# Patient Record
Sex: Male | Born: 1950 | Race: White | Hispanic: No | Marital: Married | Smoking: Former smoker
Health system: Southern US, Community
[De-identification: ages and names within clinical notes are randomized; demographics above are authoritative.]

## PROBLEM LIST (undated history)

## (undated) DIAGNOSIS — R011 Cardiac murmur, unspecified: Secondary | ICD-10-CM

## (undated) DIAGNOSIS — I350 Nonrheumatic aortic (valve) stenosis: Secondary | ICD-10-CM

## (undated) DIAGNOSIS — H269 Unspecified cataract: Secondary | ICD-10-CM

## (undated) DIAGNOSIS — R7309 Other abnormal glucose: Secondary | ICD-10-CM

## (undated) DIAGNOSIS — C439 Malignant melanoma of skin, unspecified: Secondary | ICD-10-CM

## (undated) DIAGNOSIS — Z8639 Personal history of other endocrine, nutritional and metabolic disease: Secondary | ICD-10-CM

## (undated) HISTORY — DX: Personal history of other endocrine, nutritional and metabolic disease: Z86.39

## (undated) HISTORY — DX: Cardiac murmur, unspecified: R01.1

## (undated) HISTORY — DX: Other abnormal glucose: R73.09

## (undated) HISTORY — PX: MELANOMA EXCISION: SHX5266

## (undated) HISTORY — PX: THYROGLOSSAL DUCT CYST: SHX297

## (undated) HISTORY — PX: EYE SURGERY: SHX253

## (undated) HISTORY — DX: Unspecified cataract: H26.9

---

## 1999-01-09 HISTORY — PX: THYROGLOSSAL DUCT CYST: SHX297

## 2001-01-08 HISTORY — PX: COLONOSCOPY: SHX174

## 2004-01-09 HISTORY — PX: MELANOMA EXCISION: SHX5266

## 2009-01-08 DIAGNOSIS — Z9889 Other specified postprocedural states: Secondary | ICD-10-CM

## 2009-01-08 HISTORY — DX: Other specified postprocedural states: Z98.890

## 2009-12-17 ENCOUNTER — Emergency Department: Payer: Self-pay | Admitting: Emergency Medicine

## 2012-02-12 DIAGNOSIS — Z566 Other physical and mental strain related to work: Secondary | ICD-10-CM | POA: Insufficient documentation

## 2013-03-30 DIAGNOSIS — R748 Abnormal levels of other serum enzymes: Secondary | ICD-10-CM | POA: Insufficient documentation

## 2016-11-06 DIAGNOSIS — Z8249 Family history of ischemic heart disease and other diseases of the circulatory system: Secondary | ICD-10-CM | POA: Insufficient documentation

## 2019-04-13 DIAGNOSIS — M6208 Separation of muscle (nontraumatic), other site: Secondary | ICD-10-CM | POA: Insufficient documentation

## 2019-04-13 DIAGNOSIS — K420 Umbilical hernia with obstruction, without gangrene: Secondary | ICD-10-CM | POA: Insufficient documentation

## 2019-05-26 DIAGNOSIS — R9439 Abnormal result of other cardiovascular function study: Secondary | ICD-10-CM | POA: Insufficient documentation

## 2019-05-27 MED ORDER — HEPARIN SOD (PORCINE) IN D5W 100 UNIT/ML IV SOLN
30.00 | INTRAVENOUS | Status: DC
Start: ? — End: 2019-05-27

## 2019-05-27 MED ORDER — HEPARIN SOD (PORCINE) IN D5W 100 UNIT/ML IV SOLN
60.00 | INTRAVENOUS | Status: DC
Start: ? — End: 2019-05-27

## 2019-05-27 MED ORDER — HEPARIN SOD (PORCINE) IN D5W 100 UNIT/ML IV SOLN
5.00 | INTRAVENOUS | Status: DC
Start: ? — End: 2019-05-27

## 2019-05-29 ENCOUNTER — Other Ambulatory Visit: Admission: RE | Admit: 2019-05-29 | Payer: PRIVATE HEALTH INSURANCE | Source: Ambulatory Visit

## 2019-06-02 ENCOUNTER — Encounter: Admission: RE | Payer: Self-pay | Source: Home / Self Care

## 2019-06-02 ENCOUNTER — Ambulatory Visit: Admission: RE | Admit: 2019-06-02 | Payer: Medicare Other | Source: Home / Self Care | Admitting: Ophthalmology

## 2019-06-02 SURGERY — PHACOEMULSIFICATION, CATARACT, WITH IOL INSERTION
Anesthesia: Topical | Laterality: Left

## 2019-06-09 ENCOUNTER — Encounter: Payer: Self-pay | Admitting: Cardiovascular Disease

## 2019-06-09 ENCOUNTER — Ambulatory Visit (INDEPENDENT_AMBULATORY_CARE_PROVIDER_SITE_OTHER): Payer: Medicare Other | Admitting: Cardiovascular Disease

## 2019-06-09 ENCOUNTER — Other Ambulatory Visit: Payer: Self-pay

## 2019-06-09 VITALS — BP 128/74 | HR 82 | Ht 67.0 in | Wt 214.0 lb

## 2019-06-09 DIAGNOSIS — R931 Abnormal findings on diagnostic imaging of heart and coronary circulation: Secondary | ICD-10-CM | POA: Diagnosis not present

## 2019-06-09 DIAGNOSIS — E7800 Pure hypercholesterolemia, unspecified: Secondary | ICD-10-CM | POA: Diagnosis not present

## 2019-06-09 DIAGNOSIS — I2510 Atherosclerotic heart disease of native coronary artery without angina pectoris: Secondary | ICD-10-CM

## 2019-06-09 DIAGNOSIS — I10 Essential (primary) hypertension: Secondary | ICD-10-CM | POA: Insufficient documentation

## 2019-06-09 DIAGNOSIS — E785 Hyperlipidemia, unspecified: Secondary | ICD-10-CM | POA: Insufficient documentation

## 2019-06-09 DIAGNOSIS — E669 Obesity, unspecified: Secondary | ICD-10-CM | POA: Insufficient documentation

## 2019-06-09 DIAGNOSIS — E1169 Type 2 diabetes mellitus with other specified complication: Secondary | ICD-10-CM | POA: Insufficient documentation

## 2019-06-09 HISTORY — DX: Pure hypercholesterolemia, unspecified: E78.00

## 2019-06-09 HISTORY — DX: Atherosclerotic heart disease of native coronary artery without angina pectoris: I25.10

## 2019-06-09 HISTORY — DX: Essential (primary) hypertension: I10

## 2019-06-09 HISTORY — DX: Type 2 diabetes mellitus with other specified complication: E11.69

## 2019-06-09 HISTORY — DX: Type 2 diabetes mellitus with other specified complication: E66.9

## 2019-06-09 MED ORDER — ROSUVASTATIN CALCIUM 40 MG PO TABS
40.0000 mg | ORAL_TABLET | Freq: Every day | ORAL | 1 refills | Status: DC
Start: 2019-06-09 — End: 2019-11-02

## 2019-06-09 MED ORDER — METOPROLOL SUCCINATE ER 25 MG PO TB24
25.0000 mg | ORAL_TABLET | Freq: Every day | ORAL | 1 refills | Status: DC
Start: 2019-06-09 — End: 2019-07-06

## 2019-06-09 NOTE — Patient Instructions (Addendum)
Medication Instructions:  START METOPROLOL SUC 25 MG DAILY AT BEDTIME  START ROSUVASTATIN 40 MG DAILY  START ASPIRIN 81 MG DAILY   *If you need a refill on your cardiac medications before your next appointment, please call your pharmacy*  Lab Work: NONE  Testing/Procedures: NONE   Follow-Up: At [facility], you and your health needs are our priority.  As part of our continuing mission to provide you with exceptional heart care, we have created designated Provider Care Teams.  These Care Teams include your primary Cardiologist (physician) and Advanced Practice Providers (APPs -  Physician Assistants and Nurse Practitioners) who all work together to provide you with the care you need, when you need it.  We recommend signing up for the patient portal called "MyChart".  Sign up information is provided on this After Visit Summary.  MyChart is used to connect with patients for Virtual Visits (Telemedicine).  Patients are able to view lab/test results, encounter notes, upcoming appointments, etc.  Non-urgent messages can be sent to your provider as well.   To learn more about what you can do with MyChart, go to [email or web address].    Your next appointment:   2 month(s)  The format for your next appointment:   In Person  Provider:   You may see DR [facility]  or one of the following Advanced Practice Providers on your designated Care Team:    Kerin Ransom, PA-C  [place], [place]  Coletta Memos, [place]   You have been referred to   Where: [facility] Address: [place], [place] [address] Phone: [phone]  IF YOU DO NOT HEAR FROM THE OFFICE BY 6/3 YOU CAN CALL THEM DIRECTLY

## 2019-06-09 NOTE — Progress Notes (Signed)
Cardiology Office Note   Date:  06/09/2019   ID:  Phillip [name], DOB 1950/03/04, MRN [id number]  PCP:  Phillip Gross, MD  Cardiologist:   Phillip Latch, MD   No chief complaint on file.    History of Present Illness: Phillip [name] is a 69 y.o. male with CAD, hypertension, hyperlipidemia, and diabetes who presents for a second opinion on coronary artery disease.   He initially had symptoms that he thought were attributable to hernia.  However his primary care provider was concerned that it may be ischemia.  He had an abnormal stress echo at [facility] on 05/25/19.  He achieved 7 METS and had chest pain with exercise.  There was 1 mm ST depression inferolaterally.  He developed hypokinesis in the LAD region and LV dilatation with stress.  He was referred to [facility] where high-sensitivity troponin was mildly elevated and flat (32, 36, 24).  LDL was 171 and hemoglobin A1c was 7.8%.  He underwent LHC, where he was found to have 3 vessel obstructive CAD, including proximal LAD occlusion with left to left collaterals.  He had occlusive disease in OM 2, OM1, mid to distal RCA, and PDA.  He also had right to left collaterals.  Echo revealed LVEF 50% with mild global hypokinesis and normal right ventricular function.  There is aortic valve thickening with restricted movement and mild to moderate aortic stenosis.  Dimensionless index 0.58 cm.  Mean gradient was not listed in the report.  The ascending aorta was mildly dilated at 4.1 cm.  He notes that his father also needed bypass surgery and aortic valve replacement.  He is frustrated because he is remained active and trying to have a healthy diet but seems to not have avoided these issues.  e decided that because he was feeling well he did not think he needed any intervention and wanted to seek a second opinion first.  He left AGAINST MEDICAL ADVICE and was not started on any medications.  He has been taking aspirin and amlodipine.   Overall he has been feeling well.  He continues to have some exertional intolerance, but he mostly notices this when bending over and has attributed to hernia.  1 year ago he was able to play tennis and cannot do this now.  He has not had any chest pain or pressure.  He denies lower extremity edema, orthopnea, or PND.   Past Medical History:  Diagnosis Date   CAD in native artery 06/09/2019   Diabetes mellitus type 2 in obese [facility]) 06/09/2019   Essential hypertension 06/09/2019   Pure hypercholesterolemia 06/09/2019    History reviewed. No pertinent surgical history.   Current Outpatient Medications  Medication Sig Dispense Refill   amLODipine (NORVASC) 10 MG tablet TAKE 1 TABLET BY MOUTH ONCE DAILY     aspirin 81 MG EC tablet Take 81 mg by mouth daily.      Multiple Vitamin (QUINTABS) TABS Take by mouth.     metoprolol succinate (TOPROL XL) 25 MG 24 hr tablet Take 1 tablet (25 mg total) by mouth at bedtime. 90 tablet 1   rosuvastatin (CRESTOR) 40 MG tablet Take 1 tablet (40 mg total) by mouth at bedtime. 90 tablet 1   No current facility-administered medications for this visit.    Allergies:   Patient has no known allergies.    Social History:  The patient  reports that he has never smoked. He has never used smokeless tobacco. He reports current alcohol  use.   Family History:  The patient's family history includes CAD in his father; Heart disease in his maternal grandfather and maternal uncle; Valvular heart disease in his father.    ROS:  Please see the history of present illness.   Otherwise, review of systems are positive for none.   All other systems are reviewed and negative.    PHYSICAL EXAM: VS:  BP 128/74    Pulse 82    Ht 5\' 7"  (1.702 m)    Wt 214 lb (97.1 kg)    SpO2 99%    BMI 33.52 kg/m  , BMI Body mass index is 33.52 kg/m. GENERAL:  Well appearing HEENT:  Pupils equal round and reactive, fundi not visualized, oral mucosa unremarkable NECK:  No jugular venous  distention, waveform within normal limits, carotid upstroke brisk and symmetric, no bruits, no thyromegaly LYMPHATICS:  No cervical adenopathy LUNGS:  Clear to auscultation bilaterally HEART:  RRR.  PMI not displaced or sustained,S1 and S2 within normal limits, no S3, no S4, no clicks, no rubs, II/VI mid-peaking systolic murmur at the LUSB ABD:  Flat, positive bowel sounds normal in frequency in pitch, no bruits, no rebound, no guarding, no midline pulsatile mass, no hepatomegaly, no splenomegaly EXT:  2 plus pulses throughout, no edema, no cyanosis no clubbing SKIN:  No rashes no nodules NEURO:  Cranial nerves II through XII grossly intact, motor grossly intact throughout PSYCH:  Cognitively intact, oriented to person place and time   EKG:  EKG is ordered today. The ekg ordered today demonstrates sinus rhythm.  Rate 82 bpm.  LAD.    Echo 05/26/19: SUMMARY Mild left ventricular hypertrophy The left ventricle is mildly dilated. Left ventricular systolic function is mildly reduced. There is mild global hypokinesis of the left ventricle. The right ventricle is normal in size and function. The left atrial size is normal. Right atrial size is normal. Diffuse thickening of the aortic valve with restricted cusp opening. Diffuse calcification of the aortic valve. There is mild to moderate aortic stenosis. By visual inspection of the leaflets the stenosis appears moderate but Doppler gradients indicate mild. Clinical correlation suggested. There is mild mitral regurgitation. There is mild tricuspid regurgitation. Borderline dilated ascending aorta. There is no pericardial effusion.  LHC 05/26/19: Coronary angiogram via right radial for high risk stress test. LM  engaged, mild disease. LAD has severe calcium and occluded ostial  segment. LCx gives off high OM1 (obstructive) with obstructive disease  mid and obstructive OM2. RCA engaged, obstructive prox, mid and distal  disease. L-L  and R-L collaterals present. AV not crossed. EBL < 5 cc,  prelude sync for hemostasis, no specimens.   Recent Labs: No results found for requested labs within last 8760 hours.    Lipid Panel No results found for: CHOL, TRIG, HDL, CHOLHDL, VLDL, LDLCALC, LDLDIRECT    Wt Readings from Last 3 Encounters:  06/09/19 214 lb (97.1 kg)     ASSESSMENT AND PLAN:  # Obstructive coronary disease: # Stable angina:  # Hyperlipidemia:  Phillip [name] has stable angina that seems to have been ongoing for over a year.  He underwent the appropriate testing and was found to have obstructive coronary disease in all 3 coronary distributions.  He was appropriately recommended to undergo cardiac catheterization and CABG.  He declined.  It is my secondary recommendation that he also have CABG.  He is amenable to pursuing this and would like to do this here in [place].  I will refer  him to our cardiothoracic surgery team.  We will attempt to get copies of his echo and cath images, not just the report.  In the interim, he is to continue aspirin and amlodipine.  We will add metoprolol succinate 25 mg daily as well as rosuvastatin 40 mg daily.  He will need lipids and a CMP checked in 6 to 8 weeks.  He understands that if he develops chest discomfort and there is an ongoing needs to be seen in the ED.  # Aortic stenosis:  Mild-moderate by report.  It does not sound severe on exam.  Will obtain echo images as above.  # DM:  Phillip [name] needs to establish with a primary care provider.  His hemoglobin A1c is 7.8%.  He has been trying to work on diet and exercise alone and this does not seem to be sufficient.  # Hypertension:  Continue amlodipine and add metoprolol as above.  Current medicines are reviewed at length with the patient today.  The patient does not have concerns regarding medicines.  The following changes have been made: Start rosuvastatin and metoprolol.  Labs/ tests ordered today include:    Orders Placed This Encounter  Procedures   Ambulatory referral to Cardiothoracic Surgery   EKG 12-Lead     Disposition:   FU with Dreanna Kyllo C. Oval Linsey, MD, [facility] in 2 months or whenever necessary post-op.     Signed, Itzayana Pardy C. Oval Linsey, MD, [facility]  06/09/2019 5:37 PM    [place]

## 2019-06-15 ENCOUNTER — Ambulatory Visit: Payer: PRIVATE HEALTH INSURANCE | Admitting: Cardiovascular Disease

## 2019-06-16 ENCOUNTER — Telehealth: Payer: Self-pay | Admitting: Cardiovascular Disease

## 2019-06-16 NOTE — Telephone Encounter (Signed)
New Message   Pt says he is calling for [place]. He got a call From Dr Angelene Giovanni office and they did not receive the disks from his Echo    Please advise

## 2019-06-17 ENCOUNTER — Encounter: Payer: Medicare Other | Admitting: Cardiothoracic Surgery

## 2019-06-17 NOTE — Telephone Encounter (Signed)
Per patient records received today, confirmed with Sharee Pimple at Dr George Hugh office patient has appointment tomorrow

## 2019-06-18 ENCOUNTER — Institutional Professional Consult (permissible substitution) (INDEPENDENT_AMBULATORY_CARE_PROVIDER_SITE_OTHER): Payer: Medicare Other | Admitting: Cardiothoracic Surgery

## 2019-06-18 ENCOUNTER — Ambulatory Visit
Admission: RE | Admit: 2019-06-18 | Discharge: 2019-06-18 | Disposition: A | Discharge status: Home / Self Care | Payer: Self-pay | Source: Ambulatory Visit | Attending: Cardiothoracic Surgery | Admitting: Cardiothoracic Surgery

## 2019-06-18 ENCOUNTER — Encounter: Payer: Self-pay | Admitting: Cardiothoracic Surgery

## 2019-06-18 ENCOUNTER — Other Ambulatory Visit: Payer: Self-pay

## 2019-06-18 ENCOUNTER — Other Ambulatory Visit: Payer: Self-pay | Admitting: *Deleted

## 2019-06-18 VITALS — BP 145/80 | HR 75 | Temp 97.7°F | Resp 20 | Ht 67.0 in | Wt 213.0 lb

## 2019-06-18 DIAGNOSIS — I2510 Atherosclerotic heart disease of native coronary artery without angina pectoris: Secondary | ICD-10-CM

## 2019-06-18 DIAGNOSIS — I209 Angina pectoris, unspecified: Secondary | ICD-10-CM | POA: Diagnosis not present

## 2019-06-18 NOTE — Progress Notes (Addendum)
PCP is Vertell Limber, [place] Chard, MD Referring Provider is Skeet Latch, MD  Chief Complaint  Patient presents with  . Coronary Artery Disease    Surgical eval, Cardiac Cath and ECHO 05/26/19- [facility]/  Patient examined, images of coronary arteriogram and echocardiogram performed at [facility] personally reviewed and discussed with patient.  Those images have been loaded into the [employer] cardiac imaging system at [facility]  HPI: 69 year old none smoker with past history of type 2 diabetes presents for discussion of recently diagnosed severe three-vessel coronary disease.  Patient was seen by Dr. Oval Linsey for exertional shortness of breath and a decline in exercise tolerance.  He had previously undergone a stress test followed by coronary arteriogram and echocardiogram at [facility].  These images show chronic occlusion of the LAD collateralized from the right coronary system.  The mid RCA has an 80% stenosis.  The ramus ramus intermediate has a proximal 80% stenosis in the left circumflex marginal has an 80% stenosis.  LVEDP was 14 mmHg.  The echocardiogram showed some apical hypokinesia but EF approximately 50%.  The aortic valve has some thickening but gradient measurements show to be mild aortic stenosis.  Patient has strong family history for CAD in his father grandfather and uncles.  He has dyslipidemia and high blood pressure. He states he was told he had a murmur in the past he denies history of rheumatic heart disease or scarlet fever during his childhood.  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  He denies bleeding diathesis.  he is compliant with his medications and follows a heart healthy diet.  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   Past Medical History:  Diagnosis Date  . CAD in native artery 06/09/2019  . Diabetes mellitus  type 2 in obese ([facility]) 06/09/2019  . Essential hypertension 06/09/2019  . Pure hypercholesterolemia 06/09/2019    History reviewed. No pertinent surgical history.  Family History  Problem Relation Age of Onset  . CAD Father   . Valvular heart disease Father   . Heart disease Maternal Uncle   . Heart disease Maternal Grandfather     Social History Social History   Tobacco Use  . Smoking status: Never Smoker  . Smokeless tobacco: Never Used  Substance Use Topics  . Alcohol use: Yes    Comment: wine   . Drug use: Not on file    Current Outpatient Medications  Medication Sig Dispense Refill  . amLODipine (NORVASC) 10 MG tablet TAKE 1 TABLET BY MOUTH ONCE DAILY    . aspirin 81 MG EC tablet Take 81 mg by mouth daily.     . metoprolol succinate (TOPROL XL) 25 MG 24 hr tablet Take 1 tablet (25 mg total) by mouth at bedtime. 90 tablet 1  . Multiple Vitamin (QUINTABS) TABS Take by mouth.    . rosuvastatin (CRESTOR) 40 MG tablet Take 1 tablet (40 mg total) by mouth at bedtime. 90 tablet 1   No current facility-administered medications for this visit.    No Known Allergies                     Review of Systems :  [ y ] = yes, [  ] = no        General :  Weight gain [   ]    Weight loss  [   ]  Fatigue [  ]  Fever [  ]  Chills  [  ]                                          HEENT    Headache [  ]  Dizziness [  ]  Blurred vision [  ] Glaucoma  [  ]                          Nosebleeds [  ] Painful or loose teeth [  ]        Cardiac :  Chest pain/ pressure [  ]  Resting SOB [  ] exertional SOB Blue.[email or web address]  ]                        Orthopnea [  ]  Pedal edema  [  ]  Palpitations [  ] Syncope/presyncope [ ]                         Paroxysmal nocturnal dyspnea [  ]         Pulmonary : cough [  ]  wheezing [  ]  Hemoptysis [  ] Sputum [  ] Snoring [  ]                              Pneumothorax [  ]  Sleep apnea [  ]        GI : Vomiting [  ]  Dysphagia [  ]  Melena  [  ]  Abdominal pain [  ]  BRBPR [  ]              Heart burn [  ]  Constipation [  ] Diarrhea  [  ] Colonoscopy Blue.[email or web address]   ]        GU : Hematuria [  ]  Dysuria [  ]  Nocturia [  ] UTI's [  ]        Vascular : Claudication [  ]  Rest pain [  ]  DVT [  ] Vein stripping [  ] leg ulcers [  ]                          TIA [  ] Stroke [  ]  Varicose veins [  ]        NEURO :  Headaches  [  ] Seizures [  ] Vision changes [  ] Paresthesias [  ]                                               Musculoskeletal :  Arthritis [  ] Gout  [  ]  Back pain [  ]  Joint pain [  ]        Skin :  Rash [  ]  Melanoma [ y, resected from back with clear margins] Sores [  ]        Heme : Bleeding problems [  ]Clotting Disorders [  ] Anemia [  ]  Blood Transfusion [ ]         Endocrine : Diabetes [currently A1c less than 7] Heat or Cold intolerance [  ] Polyuria [  ]excessive thirst [ ]         Psych : Depression [  ]  Anxiety [  ]  Psych hospitalizations [  ] Memory change [  ]                                                                            BP (!) 145/80   Pulse 75   Temp 97.7 F (36.5 C) (Skin)   Resp 20   Ht 5\' 7"  (1.702 m)   Wt 213 lb (96.6 kg)   SpO2 97% Comment: RA  BMI 33.36 kg/m  Physical Exam      Physical Exam  General: Very intelligent well-nourished middle-aged male no acute distress HEENT: Normocephalic pupils equal , dentition adequate Neck: Supple without JVD, adenopathy, or bruit Chest: Clear to auscultation, symmetrical breath sounds, no rhonchi, no tenderness             or deformity Cardiovascular: Regular rate and rhythm, no murmur, 2/6 AS murmur, no gallop, peripheral pulses             palpable in all extremities Abdomen:  Soft, nontender, no palpable mass or organomegaly Extremities: Warm, well-perfused, no clubbing cyanosis edema or tenderness,              no venous stasis changes of the legs Rectal/GU: Deferred Neuro: Grossly non--focal and symmetrical throughout Skin: Clean and dry  without rash or ulceration   Diagnostic Tests: Coronary xerograms show severe three-vessel coronary disease with preserved LV systolic function.  Echocardiogram shows mild thickening of the aortic valve with gradient consistent with mild aortic stenosis  Impression: Patient has severe multivessel coronary disease with minimal symptoms.  He totally occluded his LAD without symptoms.  Fortunately his LV function remains preserved.  However he should proceed with multivessel coronary bypass grafting as his best long-term therapy to preserve LV function and optimize survival.  Plan: Patient will be scheduled for multivessel CABG on June 22 at [facility].  At that time the aortic valve will be further evaluated with TEE to see if it is significant.  I have discussed the procedure of CABG in detail with the patient including the benefits risks alternatives and he agrees to proceed with surgery.   Len Childs, MD [facility] [phone]

## 2019-06-19 ENCOUNTER — Encounter: Payer: Self-pay | Admitting: *Deleted

## 2019-06-19 ENCOUNTER — Other Ambulatory Visit: Payer: Self-pay | Admitting: *Deleted

## 2019-06-19 DIAGNOSIS — I2510 Atherosclerotic heart disease of native coronary artery without angina pectoris: Secondary | ICD-10-CM

## 2019-06-25 NOTE — Progress Notes (Signed)
[place], [address] [place] [address] Phone: [phone] Fax: [phone]    Your procedure is scheduled on Tuesday, June 22nd.  Report to [facility] Main Entrance "A" at 5:30 A.M., and check in at the Admitting office.  Call this number if you have problems the morning of surgery:  [phone]  Call [phone] if you have any questions prior to your surgery date Monday-Friday 8am-4pm   Remember:  Do not eat or drink after midnight the night before your surgery    Take these medicines the morning of surgery with A SIP OF WATER  amLODipine (NORVASC)   Follow your surgeon's instructions on when to stop Aspirin.  If no instructions were given by your surgeon then you will need to call the office to get those instructions.    As of today, STOP taking a Aspirin containing products, Aleve, Naproxen, Ibuprofen, Motrin, Advil, Goody's, BC's, all herbal medications, fish oil, and all vitamins.             Do not wear jewelry.            Do not wear lotions, powders, colognes, or deodorant.            Men may shave face and neck.            Do not bring valuables to the hospital.            [facility] is not responsible for any belongings or valuables.  Do NOT Smoke (Tobacco/Vapping) or drink Alcohol 24 hours prior to your procedure If you use a CPAP at night, you may bring all equipment for your overnight stay.   Contacts, glasses, dentures or bridgework may not be worn into surgery.      For patients admitted to the hospital, discharge time will be determined by your treatment team.   Patients discharged the day of surgery will not be allowed to drive home, and someone needs to stay with them for 24 hours.  Special instructions:   [place]- Preparing For Surgery  Before surgery, you can play an important role. Because skin is not sterile, your skin needs to be as free of germs as possible. You can reduce the  number of germs on your skin by washing with CHG (chlorahexidine gluconate) Soap before surgery.  CHG is an antiseptic cleaner which kills germs and bonds with the skin to continue killing germs even after washing.    Oral Hygiene is also important to reduce your risk of infection.  Remember - BRUSH YOUR TEETH THE MORNING OF SURGERY WITH YOUR REGULAR TOOTHPASTE  Please do not use if you have an allergy to CHG or antibacterial soaps. If your skin becomes reddened/irritated stop using the CHG.  Do not shave (including legs and underarms) for at least 48 hours prior to first CHG shower. It is OK to shave your face.  Please follow these instructions carefully.   1. Shower the NIGHT BEFORE SURGERY and the MORNING OF SURGERY with CHG Soap.   2. If you chose to wash your hair, wash your hair first as usual with your normal shampoo.  3. After you shampoo, rinse your hair and body thoroughly to remove the shampoo.  4. Use CHG as you would any other liquid soap. You can apply CHG directly to the skin and wash gently with a scrungie or a clean washcloth.   5. Apply the CHG Soap to your body ONLY FROM  THE NECK DOWN.  Do not use on open wounds or open sores. Avoid contact with your eyes, ears, mouth and genitals (private parts). Wash Face and genitals (private parts)  with your normal soap.   6. Wash thoroughly, paying special attention to the area where your surgery will be performed.  7. Thoroughly rinse your body with warm water from the neck down.  8. DO NOT shower/wash with your normal soap after using and rinsing off the CHG Soap.  9. Pat yourself dry with a CLEAN TOWEL.  10. Wear CLEAN PAJAMAS to bed the night before surgery, wear comfortable clothes the morning of surgery  11. Place CLEAN SHEETS on your bed the night of your first shower and DO NOT SLEEP WITH PETS.  Day of Surgery: Shower with CHG soap as instructed above.  Do not apply any deodorants/lotions.  Please wear clean clothes  to the hospital/surgery center.   Remember to brush your teeth WITH YOUR REGULAR TOOTHPASTE.   Please read over the following fact sheets that you were given.

## 2019-06-25 NOTE — Progress Notes (Signed)
HOW TO MANAGE YOUR DIABETES BEFORE AND AFTER SURGERY  Why is it important to control my blood sugar before and after surgery? . Improving blood sugar levels before and after surgery helps healing and can limit problems. . A way of improving blood sugar control is eating a healthy diet by: o  Eating less sugar and carbohydrates o  Increasing activity/exercise o  Talking with your doctor about reaching your blood sugar goals . High blood sugars (greater than 180 mg/dL) can raise your risk of infections and slow your recovery, so you will need to focus on controlling your diabetes during the weeks before surgery. . Make sure that the doctor who takes care of your diabetes knows about your planned surgery including the date and location.  How do I manage my blood sugar before surgery? . Check your blood sugar at least 4 times a day, starting 2 days before surgery, to make sure that the level is not too high or low. . Check your blood sugar the morning of your surgery when you wake up and every 2 hours until you get to the Short Stay unit. o If your blood sugar is less than 70 mg/dL, you will need to treat for low blood sugar: - Do not take insulin. - Treat a low blood sugar (less than 70 mg/dL) with  cup of clear juice (cranberry or apple), 4 glucose tablets, OR glucose gel. - Recheck blood sugar in 15 minutes after treatment (to make sure it is greater than 70 mg/dL). If your blood sugar is not greater than 70 mg/dL on recheck, call [phone] for further instructions. . Report your blood sugar to the short stay nurse when you get to Short Stay.  . If you are admitted to the hospital after surgery: o Your blood sugar will be checked by the staff and you will probably be given insulin after surgery (instead of oral diabetes medicines) to make sure you have good blood sugar levels. o The goal for blood sugar control after surgery is 80-180 mg/dL.      

## 2019-06-26 ENCOUNTER — Other Ambulatory Visit (HOSPITAL_COMMUNITY): Payer: Medicare Other

## 2019-06-26 ENCOUNTER — Ambulatory Visit (HOSPITAL_COMMUNITY)
Admission: RE | Admit: 2019-06-26 | Discharge: 2019-06-26 | Disposition: A | Discharge status: Home / Self Care | Payer: Medicare Other | Source: Ambulatory Visit | Attending: Cardiothoracic Surgery | Admitting: Cardiothoracic Surgery

## 2019-06-26 ENCOUNTER — Other Ambulatory Visit: Payer: Self-pay

## 2019-06-26 ENCOUNTER — Other Ambulatory Visit (HOSPITAL_COMMUNITY)
Admission: RE | Admit: 2019-06-26 | Discharge: 2019-06-26 | Disposition: A | Discharge status: Home / Self Care | Payer: Medicare Other | Source: Ambulatory Visit | Attending: Cardiothoracic Surgery | Admitting: Cardiothoracic Surgery

## 2019-06-26 ENCOUNTER — Encounter (HOSPITAL_COMMUNITY): Payer: Self-pay

## 2019-06-26 ENCOUNTER — Encounter (HOSPITAL_COMMUNITY)
Admission: RE | Admit: 2019-06-26 | Discharge: 2019-06-26 | Disposition: A | Discharge status: Home / Self Care | Payer: Medicare Other | Source: Ambulatory Visit | Attending: Cardiothoracic Surgery | Admitting: Cardiothoracic Surgery

## 2019-06-26 DIAGNOSIS — Z951 Presence of aortocoronary bypass graft: Secondary | ICD-10-CM | POA: Insufficient documentation

## 2019-06-26 DIAGNOSIS — I2510 Atherosclerotic heart disease of native coronary artery without angina pectoris: Secondary | ICD-10-CM | POA: Insufficient documentation

## 2019-06-26 DIAGNOSIS — Z7982 Long term (current) use of aspirin: Secondary | ICD-10-CM | POA: Insufficient documentation

## 2019-06-26 DIAGNOSIS — I10 Essential (primary) hypertension: Secondary | ICD-10-CM | POA: Diagnosis not present

## 2019-06-26 DIAGNOSIS — E119 Type 2 diabetes mellitus without complications: Secondary | ICD-10-CM | POA: Insufficient documentation

## 2019-06-26 DIAGNOSIS — Z87891 Personal history of nicotine dependence: Secondary | ICD-10-CM | POA: Insufficient documentation

## 2019-06-26 DIAGNOSIS — Z01818 Encounter for other preprocedural examination: Secondary | ICD-10-CM | POA: Diagnosis not present

## 2019-06-26 DIAGNOSIS — Z87820 Personal history of traumatic brain injury: Secondary | ICD-10-CM | POA: Insufficient documentation

## 2019-06-26 DIAGNOSIS — Z20822 Contact with and (suspected) exposure to covid-19: Secondary | ICD-10-CM | POA: Diagnosis not present

## 2019-06-26 DIAGNOSIS — Z7901 Long term (current) use of anticoagulants: Secondary | ICD-10-CM | POA: Diagnosis not present

## 2019-06-26 DIAGNOSIS — Z79899 Other long term (current) drug therapy: Secondary | ICD-10-CM | POA: Insufficient documentation

## 2019-06-26 DIAGNOSIS — I700 Atherosclerosis of aorta: Secondary | ICD-10-CM | POA: Diagnosis not present

## 2019-06-26 HISTORY — DX: Malignant melanoma of skin, unspecified: C43.9

## 2019-06-26 HISTORY — DX: Nonrheumatic aortic (valve) stenosis: I35.0

## 2019-06-26 LAB — BLOOD GAS, ARTERIAL
Acid-Base Excess: 1 mmol/L (ref 0.0–2.0)
Bicarbonate: 24.7 mmol/L (ref 20.0–28.0)
Drawn by: 421801
FIO2: 21
O2 Saturation: 98.5 %
Patient temperature: 37
pCO2 arterial: 36.6 mmHg (ref 32.0–48.0)
pH, Arterial: 7.444 (ref 7.350–7.450)
pO2, Arterial: 106 mmHg (ref 83.0–108.0)

## 2019-06-26 LAB — URINALYSIS, ROUTINE W REFLEX MICROSCOPIC
Bacteria, UA: NONE SEEN
Bilirubin Urine: NEGATIVE
Glucose, UA: >=500 mg/dL — AB
Hgb urine dipstick: NEGATIVE
Ketones, ur: NEGATIVE mg/dL
Leukocytes,Ua: NEGATIVE
Nitrite: NEGATIVE
Protein, ur: 30 mg/dL — AB
Specific Gravity, Urine: 1.023 (ref 1.005–1.030)
pH: 5 (ref 5.0–8.0)

## 2019-06-26 LAB — COMPREHENSIVE METABOLIC PANEL
ALT: 56 U/L — ABNORMAL HIGH (ref 0–44)
AST: 32 U/L (ref 15–41)
Albumin: 4.3 g/dL (ref 3.5–5.0)
Alkaline Phosphatase: 66 U/L (ref 38–126)
Anion gap: 11 (ref 5–15)
BUN: 12 mg/dL (ref 8–23)
CO2: 24 mmol/L (ref 22–32)
Calcium: 9.9 mg/dL (ref 8.9–10.3)
Chloride: 101 mmol/L (ref 98–111)
Creatinine, Ser: 0.63 mg/dL (ref 0.61–1.24)
GFR calc Af Amer: >60 mL/min (ref >60)
GFR calc non Af Amer: >60 mL/min (ref >60)
Glucose, Bld: 263 mg/dL — ABNORMAL HIGH (ref 70–99)
Potassium: 4.1 mmol/L (ref 3.5–5.1)
Sodium: 136 mmol/L (ref 135–145)
Total Bilirubin: 1.2 mg/dL (ref 0.3–1.2)
Total Protein: 7 g/dL (ref 6.5–8.1)

## 2019-06-26 LAB — SURGICAL PCR SCREEN
MRSA, PCR: NEGATIVE
Staphylococcus aureus: NEGATIVE

## 2019-06-26 LAB — GLUCOSE, CAPILLARY: Glucose-Capillary: 250 mg/dL — ABNORMAL HIGH (ref 70–99)

## 2019-06-26 LAB — CBC
HCT: 45.2 % (ref 39.0–52.0)
Hemoglobin: 16.3 g/dL (ref 13.0–17.0)
MCH: 33.1 pg (ref 26.0–34.0)
MCHC: 36.1 g/dL — ABNORMAL HIGH (ref 30.0–36.0)
MCV: 91.9 fL (ref 80.0–100.0)
Platelets: 205 10*3/uL (ref 150–400)
RBC: 4.92 MIL/uL (ref 4.22–5.81)
RDW: 12.1 % (ref 11.5–15.5)
WBC: 8.1 10*3/uL (ref 4.0–10.5)
nRBC: 0 % (ref 0.0–0.2)

## 2019-06-26 LAB — HEMOGLOBIN A1C
Hgb A1c MFr Bld: 8 % — ABNORMAL HIGH (ref 4.8–5.6)
Mean Plasma Glucose: 182.9 mg/dL

## 2019-06-26 LAB — PROTIME-INR
INR: 1.1 (ref 0.8–1.2)
Prothrombin Time: 13.4 seconds (ref 11.4–15.2)

## 2019-06-26 LAB — ABO/RH: ABO/RH(D): O POS

## 2019-06-26 LAB — SARS CORONAVIRUS 2 (TAT 6-24 HRS): SARS Coronavirus 2: NEGATIVE

## 2019-06-26 LAB — APTT: aPTT: 32 seconds (ref 24–36)

## 2019-06-26 NOTE — Progress Notes (Signed)
PCP - patient does not currently have a PCP, switching providers Cardiologist - Dr. Oval Linsey  PPM/ICD - n/a Device Orders -  Rep Notified -   Chest x-ray - 06/26/19 EKG - 06/09/19 Stress Test - 05/25/19 ECHO - 06/18/19 Cardiac Cath - 06/18/19  Sleep Study - patient denies CPAP -   Fasting Blood Sugar -  Checks Blood Sugar _____ times a day Patient recently diagnosed with DM within the past several months.  Patient's CBG upon arrival to PAT was 250.  Patient stated Dr. Darcey Nora is aware of recent diagnosis.  Patient educated on importance of watching simple carbohydrate intake over the next several days especially leading up to surgery.  Patient provided information on importance of having blood sugars controlled before and after surgery.  Obtaining A1c with today's lab work.  Blood Thinner Instructions: Aspirin Instructions: continue ASA, hold DOS  ERAS Protcol - PRE-SURGERY Ensure or G2-   COVID TEST- after PAT appointment   Anesthesia review: yes, recent cardiac history, CAD  Patient denies shortness of breath, fever, cough and chest pain at PAT appointment   All instructions explained to the patient, with a verbal understanding of the material. Patient agrees to go over the instructions while at home for a better understanding. Patient also instructed to self quarantine after being tested for COVID-19. The opportunity to ask questions was provided.

## 2019-06-26 NOTE — Progress Notes (Signed)
Doppler pre CABG carotid and arms/legs study completed.   See Cv Proc for preliminary results.   Phillip [name]

## 2019-06-26 NOTE — Progress Notes (Signed)
[place], [address] [place] [address] Phone: [phone] Fax: [phone]    Your procedure is scheduled on Tuesday, June 22nd.  Report to [facility] Main Entrance "A" at 5:30 A.M., and check in at the Admitting office.  Call this number if you have problems the morning of surgery:  [phone]  Call [phone] if you have any questions prior to your surgery date Monday-Friday 8am-4pm   Remember:  Do not eat or drink after midnight the night before your surgery    Take these medicines the morning of surgery with A SIP OF WATER  amLODipine (NORVASC)   Follow your surgeon's instructions on when to stop Aspirin.  If no instructions were given by your surgeon then you will need to call the office to get those instructions.    As of today, STOP taking a Aspirin containing products, Aleve, Naproxen, Ibuprofen, Motrin, Advil, Goody's, BC's, all herbal medications, fish oil, and all vitamins.   HOW TO MANAGE YOUR DIABETES BEFORE AND AFTER SURGERY  Why is it important to control my blood sugar before and after surgery? . Improving blood sugar levels before and after surgery helps healing and can limit problems. . A way of improving blood sugar control is eating a healthy diet by: o  Eating less sugar and carbohydrates o  Increasing activity/exercise o  Talking with your doctor about reaching your blood sugar goals . High blood sugars (greater than 180 mg/dL) can raise your risk of infections and slow your recovery, so you will need to focus on controlling your diabetes during the weeks before surgery. . Make sure that the doctor who takes care of your diabetes knows about your planned surgery including the date and location.  How do I manage my blood sugar before surgery? . Check your blood sugar at least 4 times a day, starting 2 days before surgery, to make sure that the level is not too high or low. . Check your blood  sugar the morning of your surgery when you wake up and every 2 hours until you get to the Short Stay unit. o If your blood sugar is less than 70 mg/dL, you will need to treat for low blood sugar: - Do not take insulin. - Treat a low blood sugar (less than 70 mg/dL) with  cup of clear juice (cranberry or apple), 4 glucose tablets, OR glucose gel. - Recheck blood sugar in 15 minutes after treatment (to make sure it is greater than 70 mg/dL). If your blood sugar is not greater than 70 mg/dL on recheck, call [phone] for further instructions. . Report your blood sugar to the short stay nurse when you get to Short Stay.  . If you are admitted to the hospital after surgery: o Your blood sugar will be checked by the staff and you will probably be given insulin after surgery (instead of oral diabetes medicines) to make sure you have good blood sugar levels. o The goal for blood sugar control after surgery is 80-180 mg/dL.               Do not wear jewelry.            Do not wear lotions, powders, colognes, or deodorant.            Men may shave face and neck.            Do not bring valuables to the hospital.  [facility] is not responsible for any belongings or valuables.  Do NOT Smoke (Tobacco/Vapping) or drink Alcohol 24 hours prior to your procedure  If you use a CPAP at night, you may bring all equipment for your overnight stay.   Contacts, glasses, dentures or bridgework may not be worn into surgery.      For patients admitted to the hospital, discharge time will be determined by your treatment team.   Patients discharged the day of surgery will not be allowed to drive home, and someone needs to stay with them for 24 hours.  Special instructions:   [facility]- Preparing For Surgery  Before surgery, you can play an important role. Because skin is not sterile, your skin needs to be as free of germs as possible. You can reduce the number of germs on your skin by washing  with CHG (chlorahexidine gluconate) Soap before surgery.  CHG is an antiseptic cleaner which kills germs and bonds with the skin to continue killing germs even after washing.    Oral Hygiene is also important to reduce your risk of infection.  Remember - BRUSH YOUR TEETH THE MORNING OF SURGERY WITH YOUR REGULAR TOOTHPASTE  Please do not use if you have an allergy to CHG or antibacterial soaps. If your skin becomes reddened/irritated stop using the CHG.  Do not shave (including legs and underarms) for at least 48 hours prior to first CHG shower. It is OK to shave your face.  Please follow these instructions carefully.   1. Shower the NIGHT BEFORE SURGERY and the MORNING OF SURGERY with CHG Soap.   2. If you chose to wash your hair, wash your hair first as usual with your normal shampoo.  3. After you shampoo, rinse your hair and body thoroughly to remove the shampoo.  4. Use CHG as you would any other liquid soap. You can apply CHG directly to the skin and wash gently with a scrungie or a clean washcloth.   5. Apply the CHG Soap to your body ONLY FROM THE NECK DOWN.  Do not use on open wounds or open sores. Avoid contact with your eyes, ears, mouth and genitals (private parts). Wash Face and genitals (private parts)  with your normal soap.   6. Wash thoroughly, paying special attention to the area where your surgery will be performed.  7. Thoroughly rinse your body with warm water from the neck down.  8. DO NOT shower/wash with your normal soap after using and rinsing off the CHG Soap.  9. Pat yourself dry with a CLEAN TOWEL.  10. Wear CLEAN PAJAMAS to bed the night before surgery, wear comfortable clothes the morning of surgery  11. Place CLEAN SHEETS on your bed the night of your first shower and DO NOT SLEEP WITH PETS.  Day of Surgery: Shower with CHG soap as instructed above.  Do not apply any deodorants/lotions.  Please wear clean clothes to the hospital/surgery center.    Remember to brush your teeth WITH YOUR REGULAR TOOTHPASTE.   Please read over the following fact sheets that you were given.

## 2019-06-29 ENCOUNTER — Encounter (HOSPITAL_COMMUNITY): Payer: Self-pay

## 2019-06-29 ENCOUNTER — Other Ambulatory Visit: Payer: Self-pay

## 2019-06-29 ENCOUNTER — Ambulatory Visit (HOSPITAL_COMMUNITY)
Admission: RE | Admit: 2019-06-29 | Discharge: 2019-06-29 | Disposition: A | Discharge status: Home / Self Care | Payer: Medicare Other | Source: Ambulatory Visit | Attending: Cardiothoracic Surgery | Admitting: Cardiothoracic Surgery

## 2019-06-29 DIAGNOSIS — I2510 Atherosclerotic heart disease of native coronary artery without angina pectoris: Secondary | ICD-10-CM

## 2019-06-29 LAB — PULMONARY FUNCTION TEST
DL/VA % pred: 123 %
DL/VA: 5.12 ml/min/mmHg/L
DLCO cor % pred: 118 %
DLCO cor: 28.36 ml/min/mmHg
DLCO unc % pred: 124 %
DLCO unc: 29.63 ml/min/mmHg
FEF 25-75 Post: 2.39 L/sec
FEF 25-75 Pre: 2.8 L/sec
FEF2575-%Change-Post: -14 %
FEF2575-%Pred-Post: 104 %
FEF2575-%Pred-Pre: 121 %
FEV1-%Change-Post: -4 %
FEV1-%Pred-Post: 95 %
FEV1-%Pred-Pre: 99 %
FEV1-Post: 2.83 L
FEV1-Pre: 2.95 L
FEV1FVC-%Change-Post: -4 %
FEV1FVC-%Pred-Pre: 108 %
FEV6-%Change-Post: 1 %
FEV6-%Pred-Post: 97 %
FEV6-%Pred-Pre: 96 %
FEV6-Post: 3.67 L
FEV6-Pre: 3.63 L
FEV6FVC-%Change-Post: 0 %
FEV6FVC-%Pred-Post: 105 %
FEV6FVC-%Pred-Pre: 104 %
FVC-%Change-Post: 0 %
FVC-%Pred-Post: 92 %
FVC-%Pred-Pre: 91 %
FVC-Post: 3.71 L
FVC-Pre: 3.68 L
Post FEV1/FVC ratio: 76 %
Post FEV6/FVC ratio: 99 %
Pre FEV1/FVC ratio: 80 %
Pre FEV6/FVC Ratio: 99 %
RV % pred: 129 %
RV: 2.9 L
TLC % pred: 105 %
TLC: 6.79 L

## 2019-06-29 MED ORDER — EPINEPHRINE HCL 5 MG/250ML IV SOLN IN NS
0.0000 ug/min | INTRAVENOUS | Status: DC
  Filled 2019-06-29: qty 250

## 2019-06-29 MED ORDER — ALBUTEROL SULFATE (2.5 MG/3ML) 0.083% IN NEBU
2.5000 mg | INHALATION_SOLUTION | Freq: Once | RESPIRATORY_TRACT | Status: AC
  Administered 2019-06-29: 2.5 mg via RESPIRATORY_TRACT

## 2019-06-29 MED ORDER — PHENYLEPHRINE HCL-NACL 20-0.9 MG/250ML-% IV SOLN
30.0000 ug/min | INTRAVENOUS | Status: AC
  Administered 2019-06-30: 25 ug/min via INTRAVENOUS
  Filled 2019-06-29: qty 250

## 2019-06-29 MED ORDER — DEXMEDETOMIDINE HCL IN NACL 400 MCG/100ML IV SOLN
0.1000 ug/kg/h | INTRAVENOUS | Status: AC
  Administered 2019-06-30: .5 ug/kg/h via INTRAVENOUS
  Filled 2019-06-29: qty 100

## 2019-06-29 MED ORDER — POTASSIUM CHLORIDE 2 MEQ/ML IV SOLN
80.0000 meq | INTRAVENOUS | Status: DC
  Filled 2019-06-29: qty 40

## 2019-06-29 MED ORDER — SODIUM CHLORIDE 0.9 % IV SOLN
INTRAVENOUS | Status: DC
  Filled 2019-06-29: qty 30

## 2019-06-29 MED ORDER — MAGNESIUM SULFATE 50 % IJ SOLN
40.0000 meq | INTRAMUSCULAR | Status: DC
  Filled 2019-06-29: qty 9.85

## 2019-06-29 MED ORDER — INSULIN REGULAR(HUMAN) IN NACL 100-0.9 UT/100ML-% IV SOLN
INTRAVENOUS | Status: AC
  Administered 2019-06-30: 1 [IU]/h via INTRAVENOUS
  Filled 2019-06-29: qty 100

## 2019-06-29 MED ORDER — TRANEXAMIC ACID 1000 MG/10ML IV SOLN
1.5000 mg/kg/h | INTRAVENOUS | Status: AC
  Administered 2019-06-30: 1.5 mg/kg/h via INTRAVENOUS
  Filled 2019-06-29: qty 25

## 2019-06-29 MED ORDER — TRANEXAMIC ACID (OHS) PUMP PRIME SOLUTION
2.0000 mg/kg | INTRAVENOUS | Status: DC
  Filled 2019-06-29: qty 1.94

## 2019-06-29 MED ORDER — TRANEXAMIC ACID (OHS) BOLUS VIA INFUSION
15.0000 mg/kg | INTRAVENOUS | Status: AC
  Administered 2019-06-30: 1456.5 mg via INTRAVENOUS
  Filled 2019-06-29: qty 1457

## 2019-06-29 MED ORDER — MILRINONE LACTATE IN DEXTROSE 20-5 MG/100ML-% IV SOLN
0.3000 ug/kg/min | INTRAVENOUS | Status: AC
  Administered 2019-06-30: .25 ug/kg/min via INTRAVENOUS
  Filled 2019-06-29: qty 100

## 2019-06-29 MED ORDER — VANCOMYCIN HCL 1500 MG/300ML IV SOLN
1500.0000 mg | INTRAVENOUS | Status: AC
  Administered 2019-06-30: 1500 mg via INTRAVENOUS
  Filled 2019-06-29: qty 300

## 2019-06-29 MED ORDER — PLASMA-LYTE 148 IV SOLN
INTRAVENOUS | Status: DC
  Filled 2019-06-29: qty 2.5

## 2019-06-29 MED ORDER — SODIUM CHLORIDE 0.9 % IV SOLN
1.5000 g | INTRAVENOUS | Status: AC
  Administered 2019-06-30: 1.5 g via INTRAVENOUS
  Filled 2019-06-29: qty 1.5

## 2019-06-29 MED ORDER — NITROGLYCERIN IN D5W 200-5 MCG/ML-% IV SOLN
2.0000 ug/min | INTRAVENOUS | Status: DC
  Filled 2019-06-29: qty 250

## 2019-06-29 MED ORDER — NOREPINEPHRINE 4 MG/250ML-% IV SOLN
0.0000 ug/min | INTRAVENOUS | Status: AC
  Administered 2019-06-30: 2 ug/min via INTRAVENOUS
  Filled 2019-06-29: qty 250

## 2019-06-29 MED ORDER — SODIUM CHLORIDE 0.9 % IV SOLN
750.0000 mg | INTRAVENOUS | Status: AC
  Administered 2019-06-30: 750 mg via INTRAVENOUS
  Filled 2019-06-29 (×2): qty 750

## 2019-06-29 NOTE — Progress Notes (Signed)
Anesthesia Chart Review:  Case: [id number] Date/Time: 06/30/19 0714   Procedures:      CORONARY ARTERY BYPASS GRAFTING (CABG) (N/A Chest)     TRANSESOPHAGEAL ECHOCARDIOGRAM (TEE) (N/A )   Anesthesia type: General   Pre-op diagnosis: CAD   Location: [facility] OR ROOM 17 / [place] OR   Surgeons: Ivin Poot, MD      DISCUSSION: Patient is a 69 year old male scheduled for the above procedure.   History includes former smoker (quit 01/08/77), CAD, HTN, DM2, melanoma (s/p excision melanoma, back), thyroglossal duct cyst excision, hypercholesterolemia, aortic stenosis (mild-moderate 05/2019). BMI is consistent with obesity.  At 06/09/19 cardiology visit with Dr. Oval Linsey, she notes recent A1c 7.8% (on 05/25/19, [place]) and advised that he get established with PCP as diet and exercise alone did not seem sufficient in treating his DM2. A1c 8.0% on 06/26/19 (ordered by Dr. Prescott Gum). Will place order DM Coordinator referral for evaluation post-CABG, as he is not currently on any diabetic medications.  06/26/2019 presurgical COVID-19 test negative.  Anesthesia team to evaluate on the day of surgery.  VS: BP 134/81   Pulse 69   Temp 36.9 C (Oral)   Resp 18   Ht 5\' 7"  (1.702 m)   Wt 97.1 kg   SpO2 99%   BMI 33.53 kg/m     PROVIDERS: [name], MD is listed as PCP; however, currently says he is in between providers and does not have a PCP. - Skeet Latch, MD is primary cardiologist (had previously seen Dina Rich, MD during May 2020 [facility] admission, but he left AMA and wanted secondary cardiology opinion before agreeing to intervention for CAD)   LABS: Preoperative labs noted. ALT 56. A1c 8.0%. See DISCUSSION.  (all labs ordered are listed, but only abnormal results are displayed)  Labs Reviewed  CBC - Abnormal; Notable for the following components:      Result Value   MCHC 36.1 (*)    All other components within normal limits  COMPREHENSIVE METABOLIC PANEL -  Abnormal; Notable for the following components:   Glucose, Bld 263 (*)    ALT 56 (*)    All other components within normal limits  HEMOGLOBIN A1C - Abnormal; Notable for the following components:   Hgb A1c MFr Bld 8.0 (*)    All other components within normal limits  URINALYSIS, ROUTINE W REFLEX MICROSCOPIC - Abnormal; Notable for the following components:   APPearance HAZY (*)    Glucose, UA >=500 (*)    Protein, ur 30 (*)    All other components within normal limits  SURGICAL PCR SCREEN  APTT  BLOOD GAS, ARTERIAL  PROTIME-INR  TYPE AND SCREEN  ABO/RH     IMAGES: CXR 06/26/19: FINDINGS: Lung volumes are normal. No consolidative airspace disease. No pleural effusions. No pneumothorax. No pulmonary nodule or mass noted. Pulmonary vasculature and the cardiomediastinal silhouette are within normal limits. Atherosclerosis in the thoracic aorta. Surgical clips project over the left axillary region, likely from prior cholecystectomy. IMPRESSION: 1. No radiographic evidence of acute cardiopulmonary disease. 2. Aortic atherosclerosis.   EKG: 06/09/19: Normal sinus rhythm.  Left axis deviation.  Anteroseptal infarct, age undetermined.   CV: Carotid US 06/26/19: Summary:  - Right Carotid: Velocities in the right ICA are consistent with a 1-39%  stenosis.  - Left Carotid: The extracranial vessels were near-normal with only minimal  wall thickening or plaque.  - Vertebrals: Bilateral vertebral arteries demonstrate antegrade flow.  - Subclavians: Normal  flow hemodynamics were seen in bilateral subclavian arteries.    Cardiac cath 05/26/19 [facility]): Coronary angiogram via right radial for high risk stress test. LM  engaged, mild disease. LAD has severe calcium and occluded ostial  segment. LCx gives off high OM1 (obstructive) with obstructive disease  mid and obstructive OM2. RCA engaged, obstructive prox, mid and distal  disease. L-L and R-L collaterals present. AV not  crossed.    Echo 05/26/19: SUMMARY  Mild left ventricular hypertrophy  The left ventricle is mildly dilated.  Left ventricular systolic function is mildly reduced.  There is mild global hypokinesis of the left ventricle.  The right ventricle is normal in size and function.  The left atrial size is normal.  Right atrial size is normal.  Diffuse thickening of the aortic valve with restricted cusp opening.  Diffuse calcification of the aortic valve.  There is mild to moderate aortic stenosis. By visual inspection of the  leaflets the stenosis appears moderate but Doppler gradients indicate  mild. Clinical correlation suggested.  There is mild mitral regurgitation.  There is mild tricuspid regurgitation.  Borderline dilated ascending aorta.  There is no pericardial effusion.   Doppler Measurements & Calculations  MV E max vel:   MV dec time:     SV(LVOT):  72.9 ml LV V1 VTI:  64.0 cm/sec    0.25 sec       Ao V2 max: 23.1 cm  MV A max vel:       282.6 cm/sec  71.1 cm/sec        Ao max PG:  MV E/A: 0.90        31.9 mmHg  Med Peak E' Vel:    Ao V2 mean:  6.5 cm/sec         209.0 cm/sec  Lat Peak E' Vel:     Ao mean PG:  6.9 cm/sec         18.9 mmHg  E/Lat E`: 9.3        Ao V2 VTI:  E/Med E`: 9.8       61.0 cm                   AVA (VTI):              1.2 cm2     Past Medical History:  Diagnosis Date  . Aortic stenosis    mild to moderate AS 05/2019 echo   . CAD in native artery 06/09/2019  . Diabetes mellitus type 2 in obese ([place]) 06/09/2019  . Essential hypertension 06/09/2019  . Melanoma ([place])    back  . Pure hypercholesterolemia 06/09/2019    Past Surgical History:  Procedure Laterality Date  . MELANOMA EXCISION     back  . THYROGLOSSAL DUCT CYST      MEDICATIONS: . amLODipine (NORVASC) 10 MG tablet  . aspirin 81 MG EC tablet  . metoprolol succinate (TOPROL XL) 25 MG 24 hr tablet   . Multiple Vitamins-Minerals (MULTIVITAMIN WITH MINERALS) tablet  . rosuvastatin (CRESTOR) 40 MG tablet   No current facility-administered medications for this encounter.   Derrill Memo ON 06/30/2019] cefUROXime (ZINACEF) 1.5 g in sodium chloride 0.9 % 100 mL IVPB  . [START ON 06/30/2019] cefUROXime (ZINACEF) 750 mg in sodium chloride 0.9 % 100 mL IVPB  . [START ON 06/30/2019] dexmedetomidine (PRECEDEX) 400 MCG/100ML (4 mcg/mL) infusion  . [START ON 06/30/2019] EPINEPHrine (ADRENALIN) 4 mg in NS 250 mL (0.016 mg/mL) premix infusion  . [START  ON 06/30/2019] heparin 30,000 units/NS 1000 mL solution for CELLSAVER  . [START ON 06/30/2019] heparin sodium (porcine) 2,500 Units, papaverine 30 mg in electrolyte-148 (PLASMALYTE-148) 500 mL irrigation  . [START ON 06/30/2019] insulin regular, human (MYXREDLIN) 100 units/ 100 mL infusion  . [START ON 06/30/2019] magnesium sulfate (IV Push/IM) injection 40 mEq  . [START ON 06/30/2019] milrinone (PRIMACOR) 20 MG/100 ML (0.2 mg/mL) infusion  . [START ON 06/30/2019] nitroGLYCERIN 50 mg in dextrose 5 % 250 mL (0.2 mg/mL) infusion  . [START ON 06/30/2019] norepinephrine (LEVOPHED) 4mg  in 248mL premix infusion  . [START ON 06/30/2019] phenylephrine (NEOSYNEPHRINE) 20-0.9 MG/250ML-% infusion  . [START ON 06/30/2019] potassium chloride injection 80 mEq  . [START ON 06/30/2019] tranexamic acid (CYKLOKAPRON) 2,500 mg in sodium chloride 0.9 % 250 mL (10 mg/mL) infusion  . [START ON 06/30/2019] tranexamic acid (CYKLOKAPRON) bolus via infusion - over 30 minutes 1,456.5 mg  . [START ON 06/30/2019] tranexamic acid (CYKLOKAPRON) pump prime solution 194 mg  . [START ON 06/30/2019] vancomycin (VANCOREADY) IVPB 1500 mg/300 mL    Myra Gianotti, PA-C Surgical Short Stay/Anesthesiology [facility] Phone [phone] [facility] Phone [phone] 06/29/2019 11:19 AM

## 2019-06-29 NOTE — Anesthesia Preprocedure Evaluation (Addendum)
Anesthesia Evaluation  Patient identified by MRN, date of birth, ID band Patient awake    Reviewed: Allergy & Precautions, NPO status , Patient's Chart, lab work & pertinent test results  History of Anesthesia Complications Negative for: history of anesthetic complications  Airway Mallampati: II  TM Distance: >3 FB     Dental  (+) Chipped, Dental Advisory Given, Teeth Intact,    Pulmonary neg sleep apnea, neg COPD, neg recent URI, former smoker,    breath sounds clear to auscultation       Cardiovascular hypertension, Pt. on medications and Pt. on home beta blockers (-) angina+ CAD  (-) dysrhythmias  Rhythm:Regular     Neuro/Psych  Neuromuscular disease negative psych ROS   GI/Hepatic   Endo/Other  diabetes, Type 2  Renal/GU      Musculoskeletal negative musculoskeletal ROS (+)   Abdominal   Peds  Hematology negative hematology ROS (+)   Anesthesia Other Findings   Reproductive/Obstetrics                           Anesthesia Physical Anesthesia Plan  ASA: IV  Anesthesia Plan: General   Post-op Pain Management:    Induction: Intravenous  PONV Risk Score and Plan: Treatment may vary due to age or medical condition  Airway Management Planned: Oral ETT  Additional Equipment: Arterial line, CVP, PA Cath, Ultrasound Guidance Line Placement and TEE  Intra-op Plan:   Post-operative Plan: Post-operative intubation/ventilation  Informed Consent: I have reviewed the patients History and Physical, chart, labs and discussed the procedure including the risks, benefits and alternatives for the proposed anesthesia with the patient or authorized representative who has indicated his/her understanding and acceptance.     Dental advisory given  Plan Discussed with: CRNA and Surgeon  Anesthesia Plan Comments: (PAT note written 06/29/2019 by Myra Gianotti, PA-C. )      Anesthesia Quick  Evaluation

## 2019-06-30 ENCOUNTER — Inpatient Hospital Stay (HOSPITAL_COMMUNITY): Payer: Medicare Other

## 2019-06-30 ENCOUNTER — Inpatient Hospital Stay (HOSPITAL_COMMUNITY): Payer: Medicare Other | Admitting: Physician Assistant

## 2019-06-30 ENCOUNTER — Encounter (HOSPITAL_COMMUNITY): Payer: Self-pay | Admitting: Cardiothoracic Surgery

## 2019-06-30 ENCOUNTER — Encounter (HOSPITAL_COMMUNITY)
Admission: RE | Disposition: A | Discharge status: Home / Self Care | Payer: Self-pay | Source: Home / Self Care | Attending: Cardiothoracic Surgery

## 2019-06-30 ENCOUNTER — Other Ambulatory Visit: Payer: Self-pay

## 2019-06-30 ENCOUNTER — Inpatient Hospital Stay (HOSPITAL_COMMUNITY): Payer: Medicare Other | Admitting: Vascular Surgery

## 2019-06-30 ENCOUNTER — Inpatient Hospital Stay (HOSPITAL_COMMUNITY)
Admission: RE | Admit: 2019-06-30 | Discharge: 2019-07-06 | DRG: 236 | Disposition: A | Discharge status: Home / Self Care | Payer: Medicare Other | Attending: Cardiothoracic Surgery | Admitting: Cardiothoracic Surgery

## 2019-06-30 DIAGNOSIS — R55 Syncope and collapse: Secondary | ICD-10-CM | POA: Diagnosis not present

## 2019-06-30 DIAGNOSIS — R031 Nonspecific low blood-pressure reading: Secondary | ICD-10-CM | POA: Diagnosis not present

## 2019-06-30 DIAGNOSIS — E1169 Type 2 diabetes mellitus with other specified complication: Secondary | ICD-10-CM | POA: Diagnosis present

## 2019-06-30 DIAGNOSIS — Z87891 Personal history of nicotine dependence: Secondary | ICD-10-CM | POA: Diagnosis not present

## 2019-06-30 DIAGNOSIS — E669 Obesity, unspecified: Secondary | ICD-10-CM | POA: Diagnosis present

## 2019-06-30 DIAGNOSIS — Y92231 Patient bathroom in hospital as the place of occurrence of the external cause: Secondary | ICD-10-CM | POA: Diagnosis not present

## 2019-06-30 DIAGNOSIS — I350 Nonrheumatic aortic (valve) stenosis: Secondary | ICD-10-CM | POA: Diagnosis present

## 2019-06-30 DIAGNOSIS — I313 Pericardial effusion (noninflammatory): Secondary | ICD-10-CM | POA: Diagnosis not present

## 2019-06-30 DIAGNOSIS — Z85820 Personal history of malignant melanoma of skin: Secondary | ICD-10-CM | POA: Diagnosis not present

## 2019-06-30 DIAGNOSIS — Z6836 Body mass index (BMI) 36.0-36.9, adult: Secondary | ICD-10-CM | POA: Diagnosis not present

## 2019-06-30 DIAGNOSIS — E876 Hypokalemia: Secondary | ICD-10-CM | POA: Diagnosis not present

## 2019-06-30 DIAGNOSIS — J90 Pleural effusion, not elsewhere classified: Secondary | ICD-10-CM | POA: Diagnosis present

## 2019-06-30 DIAGNOSIS — I25118 Atherosclerotic heart disease of native coronary artery with other forms of angina pectoris: Secondary | ICD-10-CM | POA: Diagnosis present

## 2019-06-30 DIAGNOSIS — D62 Acute posthemorrhagic anemia: Secondary | ICD-10-CM | POA: Diagnosis not present

## 2019-06-30 DIAGNOSIS — Z9889 Other specified postprocedural states: Secondary | ICD-10-CM

## 2019-06-30 DIAGNOSIS — E7800 Pure hypercholesterolemia, unspecified: Secondary | ICD-10-CM | POA: Diagnosis present

## 2019-06-30 DIAGNOSIS — I10 Essential (primary) hypertension: Secondary | ICD-10-CM | POA: Diagnosis present

## 2019-06-30 DIAGNOSIS — I700 Atherosclerosis of aorta: Secondary | ICD-10-CM | POA: Diagnosis present

## 2019-06-30 DIAGNOSIS — Z951 Presence of aortocoronary bypass graft: Secondary | ICD-10-CM

## 2019-06-30 DIAGNOSIS — E785 Hyperlipidemia, unspecified: Secondary | ICD-10-CM | POA: Diagnosis present

## 2019-06-30 DIAGNOSIS — Z79899 Other long term (current) drug therapy: Secondary | ICD-10-CM

## 2019-06-30 DIAGNOSIS — I25119 Atherosclerotic heart disease of native coronary artery with unspecified angina pectoris: Secondary | ICD-10-CM | POA: Diagnosis present

## 2019-06-30 DIAGNOSIS — I2510 Atherosclerotic heart disease of native coronary artery without angina pectoris: Secondary | ICD-10-CM | POA: Diagnosis not present

## 2019-06-30 DIAGNOSIS — J939 Pneumothorax, unspecified: Secondary | ICD-10-CM

## 2019-06-30 DIAGNOSIS — Z8249 Family history of ischemic heart disease and other diseases of the circulatory system: Secondary | ICD-10-CM | POA: Diagnosis not present

## 2019-06-30 HISTORY — PX: TEE WITHOUT CARDIOVERSION: SHX5443

## 2019-06-30 HISTORY — PX: ENDOVEIN HARVEST OF GREATER SAPHENOUS VEIN: SHX5059

## 2019-06-30 HISTORY — PX: CORONARY ARTERY BYPASS GRAFT: SHX141

## 2019-06-30 HISTORY — PX: CARDIAC SURGERY: SHX584

## 2019-06-30 LAB — POCT I-STAT, CHEM 8
BUN: 14 mg/dL (ref 8–23)
BUN: 13 mg/dL (ref 8–23)
BUN: 11 mg/dL (ref 8–23)
BUN: 13 mg/dL (ref 8–23)
BUN: 10 mg/dL (ref 8–23)
BUN: 9 mg/dL (ref 8–23)
BUN: 9 mg/dL (ref 8–23)
BUN: 8 mg/dL (ref 8–23)
Calcium, Ion: 1.29 mmol/L (ref 1.15–1.40)
Calcium, Ion: 1.25 mmol/L (ref 1.15–1.40)
Calcium, Ion: 1.19 mmol/L (ref 1.15–1.40)
Calcium, Ion: 1.35 mmol/L (ref 1.15–1.40)
Calcium, Ion: 1.17 mmol/L (ref 1.15–1.40)
Calcium, Ion: 1.09 mmol/L — ABNORMAL LOW (ref 1.15–1.40)
Calcium, Ion: 1.12 mmol/L — ABNORMAL LOW (ref 1.15–1.40)
Calcium, Ion: 1.02 mmol/L — ABNORMAL LOW (ref 1.15–1.40)
Chloride: 100 mmol/L (ref 98–111)
Chloride: 102 mmol/L (ref 98–111)
Chloride: 102 mmol/L (ref 98–111)
Chloride: 99 mmol/L (ref 98–111)
Chloride: 100 mmol/L (ref 98–111)
Chloride: 100 mmol/L (ref 98–111)
Chloride: 100 mmol/L (ref 98–111)
Chloride: 102 mmol/L (ref 98–111)
Creatinine, Ser: 0.5 mg/dL — ABNORMAL LOW (ref 0.61–1.24)
Creatinine, Ser: 0.5 mg/dL — ABNORMAL LOW (ref 0.61–1.24)
Creatinine, Ser: 0.4 mg/dL — ABNORMAL LOW (ref 0.61–1.24)
Creatinine, Ser: 0.5 mg/dL — ABNORMAL LOW (ref 0.61–1.24)
Creatinine, Ser: 0.4 mg/dL — ABNORMAL LOW (ref 0.61–1.24)
Creatinine, Ser: 0.4 mg/dL — ABNORMAL LOW (ref 0.61–1.24)
Creatinine, Ser: 0.4 mg/dL — ABNORMAL LOW (ref 0.61–1.24)
Creatinine, Ser: 0.4 mg/dL — ABNORMAL LOW (ref 0.61–1.24)
Glucose, Bld: 270 mg/dL — ABNORMAL HIGH (ref 70–99)
Glucose, Bld: 205 mg/dL — ABNORMAL HIGH (ref 70–99)
Glucose, Bld: 127 mg/dL — ABNORMAL HIGH (ref 70–99)
Glucose, Bld: 164 mg/dL — ABNORMAL HIGH (ref 70–99)
Glucose, Bld: 136 mg/dL — ABNORMAL HIGH (ref 70–99)
Glucose, Bld: 157 mg/dL — ABNORMAL HIGH (ref 70–99)
Glucose, Bld: 163 mg/dL — ABNORMAL HIGH (ref 70–99)
Glucose, Bld: 140 mg/dL — ABNORMAL HIGH (ref 70–99)
HCT: 40 % (ref 39.0–52.0)
HCT: 37 % — ABNORMAL LOW (ref 39.0–52.0)
HCT: 32 % — ABNORMAL LOW (ref 39.0–52.0)
HCT: 39 % (ref 39.0–52.0)
HCT: 27 % — ABNORMAL LOW (ref 39.0–52.0)
HCT: 26 % — ABNORMAL LOW (ref 39.0–52.0)
HCT: 26 % — ABNORMAL LOW (ref 39.0–52.0)
HCT: 26 % — ABNORMAL LOW (ref 39.0–52.0)
Hemoglobin: 13.6 g/dL (ref 13.0–17.0)
Hemoglobin: 12.6 g/dL — ABNORMAL LOW (ref 13.0–17.0)
Hemoglobin: 10.9 g/dL — ABNORMAL LOW (ref 13.0–17.0)
Hemoglobin: 13.3 g/dL (ref 13.0–17.0)
Hemoglobin: 9.2 g/dL — ABNORMAL LOW (ref 13.0–17.0)
Hemoglobin: 8.8 g/dL — ABNORMAL LOW (ref 13.0–17.0)
Hemoglobin: 8.8 g/dL — ABNORMAL LOW (ref 13.0–17.0)
Hemoglobin: 8.8 g/dL — ABNORMAL LOW (ref 13.0–17.0)
Potassium: 4.3 mmol/L (ref 3.5–5.1)
Potassium: 4.2 mmol/L (ref 3.5–5.1)
Potassium: 4 mmol/L (ref 3.5–5.1)
Potassium: 4.1 mmol/L (ref 3.5–5.1)
Potassium: 3.8 mmol/L (ref 3.5–5.1)
Potassium: 3.8 mmol/L (ref 3.5–5.1)
Potassium: 3.9 mmol/L (ref 3.5–5.1)
Potassium: 3.3 mmol/L — ABNORMAL LOW (ref 3.5–5.1)
Sodium: 139 mmol/L (ref 135–145)
Sodium: 139 mmol/L (ref 135–145)
Sodium: 138 mmol/L (ref 135–145)
Sodium: 140 mmol/L (ref 135–145)
Sodium: 139 mmol/L (ref 135–145)
Sodium: 140 mmol/L (ref 135–145)
Sodium: 140 mmol/L (ref 135–145)
Sodium: 143 mmol/L (ref 135–145)
TCO2: 29 mmol/L (ref 22–32)
TCO2: 25 mmol/L (ref 22–32)
TCO2: 25 mmol/L (ref 22–32)
TCO2: 29 mmol/L (ref 22–32)
TCO2: 29 mmol/L (ref 22–32)
TCO2: 27 mmol/L (ref 22–32)
TCO2: 27 mmol/L (ref 22–32)
TCO2: 25 mmol/L (ref 22–32)

## 2019-06-30 LAB — CBC
HCT: 29 % — ABNORMAL LOW (ref 39.0–52.0)
HCT: 24.3 % — ABNORMAL LOW (ref 39.0–52.0)
Hemoglobin: 10.3 g/dL — ABNORMAL LOW (ref 13.0–17.0)
Hemoglobin: 8.6 g/dL — ABNORMAL LOW (ref 13.0–17.0)
MCH: 32.3 pg (ref 26.0–34.0)
MCH: 32.3 pg (ref 26.0–34.0)
MCHC: 35.5 g/dL (ref 30.0–36.0)
MCHC: 35.4 g/dL (ref 30.0–36.0)
MCV: 90.9 fL (ref 80.0–100.0)
MCV: 91.4 fL (ref 80.0–100.0)
Platelets: 163 10*3/uL (ref 150–400)
Platelets: 142 10*3/uL — ABNORMAL LOW (ref 150–400)
RBC: 3.19 MIL/uL — ABNORMAL LOW (ref 4.22–5.81)
RBC: 2.66 MIL/uL — ABNORMAL LOW (ref 4.22–5.81)
RDW: 12 % (ref 11.5–15.5)
RDW: 12.2 % (ref 11.5–15.5)
WBC: 14.9 10*3/uL — ABNORMAL HIGH (ref 4.0–10.5)
WBC: 11 10*3/uL — ABNORMAL HIGH (ref 4.0–10.5)
nRBC: 0 % (ref 0.0–0.2)
nRBC: 0 % (ref 0.0–0.2)

## 2019-06-30 LAB — POCT I-STAT 7, (LYTES, BLD GAS, ICA,H+H)
Acid-Base Excess: 0 mmol/L (ref 0.0–2.0)
Acid-Base Excess: 3 mmol/L — ABNORMAL HIGH (ref 0.0–2.0)
Acid-Base Excess: 3 mmol/L — ABNORMAL HIGH (ref 0.0–2.0)
Acid-Base Excess: 4 mmol/L — ABNORMAL HIGH (ref 0.0–2.0)
Acid-Base Excess: 1 mmol/L (ref 0.0–2.0)
Acid-Base Excess: 1 mmol/L (ref 0.0–2.0)
Acid-base deficit: 1 mmol/L (ref 0.0–2.0)
Acid-base deficit: 2 mmol/L (ref 0.0–2.0)
Bicarbonate: 25.3 mmol/L (ref 20.0–28.0)
Bicarbonate: 28.1 mmol/L — ABNORMAL HIGH (ref 20.0–28.0)
Bicarbonate: 27.3 mmol/L (ref 20.0–28.0)
Bicarbonate: 28.2 mmol/L — ABNORMAL HIGH (ref 20.0–28.0)
Bicarbonate: 25.6 mmol/L (ref 20.0–28.0)
Bicarbonate: 25.5 mmol/L (ref 20.0–28.0)
Bicarbonate: 23.5 mmol/L (ref 20.0–28.0)
Bicarbonate: 22.7 mmol/L (ref 20.0–28.0)
Calcium, Ion: 1.34 mmol/L (ref 1.15–1.40)
Calcium, Ion: 1.05 mmol/L — ABNORMAL LOW (ref 1.15–1.40)
Calcium, Ion: 1.09 mmol/L — ABNORMAL LOW (ref 1.15–1.40)
Calcium, Ion: 1.11 mmol/L — ABNORMAL LOW (ref 1.15–1.40)
Calcium, Ion: 1.01 mmol/L — ABNORMAL LOW (ref 1.15–1.40)
Calcium, Ion: 1.1 mmol/L — ABNORMAL LOW (ref 1.15–1.40)
Calcium, Ion: 1.11 mmol/L — ABNORMAL LOW (ref 1.15–1.40)
Calcium, Ion: 1.12 mmol/L — ABNORMAL LOW (ref 1.15–1.40)
HCT: 43 % (ref 39.0–52.0)
HCT: 30 % — ABNORMAL LOW (ref 39.0–52.0)
HCT: 29 % — ABNORMAL LOW (ref 39.0–52.0)
HCT: 27 % — ABNORMAL LOW (ref 39.0–52.0)
HCT: 27 % — ABNORMAL LOW (ref 39.0–52.0)
HCT: 27 % — ABNORMAL LOW (ref 39.0–52.0)
HCT: 25 % — ABNORMAL LOW (ref 39.0–52.0)
HCT: 25 % — ABNORMAL LOW (ref 39.0–52.0)
Hemoglobin: 14.6 g/dL (ref 13.0–17.0)
Hemoglobin: 10.2 g/dL — ABNORMAL LOW (ref 13.0–17.0)
Hemoglobin: 9.9 g/dL — ABNORMAL LOW (ref 13.0–17.0)
Hemoglobin: 9.2 g/dL — ABNORMAL LOW (ref 13.0–17.0)
Hemoglobin: 9.2 g/dL — ABNORMAL LOW (ref 13.0–17.0)
Hemoglobin: 9.2 g/dL — ABNORMAL LOW (ref 13.0–17.0)
Hemoglobin: 8.5 g/dL — ABNORMAL LOW (ref 13.0–17.0)
Hemoglobin: 8.5 g/dL — ABNORMAL LOW (ref 13.0–17.0)
O2 Saturation: 100 %
O2 Saturation: 100 %
O2 Saturation: 100 %
O2 Saturation: 100 %
O2 Saturation: 100 %
O2 Saturation: 98 %
O2 Saturation: 98 %
O2 Saturation: 94 %
Patient temperature: 36.6
Patient temperature: 36.9
Patient temperature: 37.3
Potassium: 4.3 mmol/L (ref 3.5–5.1)
Potassium: 4.1 mmol/L (ref 3.5–5.1)
Potassium: 3.9 mmol/L (ref 3.5–5.1)
Potassium: 4.1 mmol/L (ref 3.5–5.1)
Potassium: 3.3 mmol/L — ABNORMAL LOW (ref 3.5–5.1)
Potassium: 3.9 mmol/L (ref 3.5–5.1)
Potassium: 3.9 mmol/L (ref 3.5–5.1)
Potassium: 3.9 mmol/L (ref 3.5–5.1)
Sodium: 138 mmol/L (ref 135–145)
Sodium: 142 mmol/L (ref 135–145)
Sodium: 139 mmol/L (ref 135–145)
Sodium: 140 mmol/L (ref 135–145)
Sodium: 143 mmol/L (ref 135–145)
Sodium: 142 mmol/L (ref 135–145)
Sodium: 143 mmol/L (ref 135–145)
Sodium: 143 mmol/L (ref 135–145)
TCO2: 27 mmol/L (ref 22–32)
TCO2: 29 mmol/L (ref 22–32)
TCO2: 29 mmol/L (ref 22–32)
TCO2: 29 mmol/L (ref 22–32)
TCO2: 27 mmol/L (ref 22–32)
TCO2: 27 mmol/L (ref 22–32)
TCO2: 25 mmol/L (ref 22–32)
TCO2: 24 mmol/L (ref 22–32)
pCO2 arterial: 43 mmHg (ref 32.0–48.0)
pCO2 arterial: 44.5 mmHg (ref 32.0–48.0)
pCO2 arterial: 40.1 mmHg (ref 32.0–48.0)
pCO2 arterial: 41.1 mmHg (ref 32.0–48.0)
pCO2 arterial: 40.5 mmHg (ref 32.0–48.0)
pCO2 arterial: 39.8 mmHg (ref 32.0–48.0)
pCO2 arterial: 39.4 mmHg (ref 32.0–48.0)
pCO2 arterial: 39.4 mmHg (ref 32.0–48.0)
pH, Arterial: 7.378 (ref 7.350–7.450)
pH, Arterial: 7.408 (ref 7.350–7.450)
pH, Arterial: 7.441 (ref 7.350–7.450)
pH, Arterial: 7.444 (ref 7.350–7.450)
pH, Arterial: 7.409 (ref 7.350–7.450)
pH, Arterial: 7.414 (ref 7.350–7.450)
pH, Arterial: 7.383 (ref 7.350–7.450)
pH, Arterial: 7.371 (ref 7.350–7.450)
pO2, Arterial: 274 mmHg — ABNORMAL HIGH (ref 83.0–108.0)
pO2, Arterial: 307 mmHg — ABNORMAL HIGH (ref 83.0–108.0)
pO2, Arterial: 387 mmHg — ABNORMAL HIGH (ref 83.0–108.0)
pO2, Arterial: 365 mmHg — ABNORMAL HIGH (ref 83.0–108.0)
pO2, Arterial: 249 mmHg — ABNORMAL HIGH (ref 83.0–108.0)
pO2, Arterial: 104 mmHg (ref 83.0–108.0)
pO2, Arterial: 103 mmHg (ref 83.0–108.0)
pO2, Arterial: 73 mmHg — ABNORMAL LOW (ref 83.0–108.0)

## 2019-06-30 LAB — PROTIME-INR
INR: 1.3 — ABNORMAL HIGH (ref 0.8–1.2)
Prothrombin Time: 16 seconds — ABNORMAL HIGH (ref 11.4–15.2)

## 2019-06-30 LAB — PREPARE RBC (CROSSMATCH)

## 2019-06-30 LAB — PLATELET COUNT: Platelets: 121 10*3/uL — ABNORMAL LOW (ref 150–400)

## 2019-06-30 LAB — BASIC METABOLIC PANEL
Anion gap: 6 (ref 5–15)
BUN: 7 mg/dL — ABNORMAL LOW (ref 8–23)
CO2: 23 mmol/L (ref 22–32)
Calcium: 7.9 mg/dL — ABNORMAL LOW (ref 8.9–10.3)
Chloride: 107 mmol/L (ref 98–111)
Creatinine, Ser: 0.65 mg/dL (ref 0.61–1.24)
GFR calc Af Amer: >60 mL/min (ref >60)
GFR calc non Af Amer: >60 mL/min (ref >60)
Glucose, Bld: 155 mg/dL — ABNORMAL HIGH (ref 70–99)
Potassium: 3.6 mmol/L (ref 3.5–5.1)
Sodium: 136 mmol/L (ref 135–145)

## 2019-06-30 LAB — HEMOGLOBIN AND HEMATOCRIT, BLOOD
HCT: 26.8 % — ABNORMAL LOW (ref 39.0–52.0)
Hemoglobin: 9.7 g/dL — ABNORMAL LOW (ref 13.0–17.0)

## 2019-06-30 LAB — GLUCOSE, CAPILLARY
Glucose-Capillary: 113 mg/dL — ABNORMAL HIGH (ref 70–99)
Glucose-Capillary: 142 mg/dL — ABNORMAL HIGH (ref 70–99)
Glucose-Capillary: 153 mg/dL — ABNORMAL HIGH (ref 70–99)
Glucose-Capillary: 162 mg/dL — ABNORMAL HIGH (ref 70–99)
Glucose-Capillary: 166 mg/dL — ABNORMAL HIGH (ref 70–99)
Glucose-Capillary: 175 mg/dL — ABNORMAL HIGH (ref 70–99)
Glucose-Capillary: 177 mg/dL — ABNORMAL HIGH (ref 70–99)
Glucose-Capillary: 229 mg/dL — ABNORMAL HIGH (ref 70–99)

## 2019-06-30 LAB — APTT: aPTT: 32 seconds (ref 24–36)

## 2019-06-30 LAB — MAGNESIUM: Magnesium: 2.5 mg/dL — ABNORMAL HIGH (ref 1.7–2.4)

## 2019-06-30 SURGERY — CORONARY ARTERY BYPASS GRAFTING (CABG)
Anesthesia: General | Site: Chest

## 2019-06-30 MED ORDER — BISACODYL 5 MG PO TBEC
10.0000 mg | DELAYED_RELEASE_TABLET | Freq: Every day | ORAL | Status: DC
  Administered 2019-07-01 – 2019-07-06 (×4): 10 mg via ORAL
  Filled 2019-06-30 (×4): qty 2

## 2019-06-30 MED ORDER — DOCUSATE SODIUM 100 MG PO CAPS
200.0000 mg | ORAL_CAPSULE | Freq: Every day | ORAL | Status: DC
  Administered 2019-07-01 – 2019-07-06 (×4): 200 mg via ORAL
  Filled 2019-06-30 (×5): qty 2

## 2019-06-30 MED ORDER — CHLORHEXIDINE GLUCONATE 4 % EX LIQD: 30.0000 mL | CUTANEOUS | Status: DC

## 2019-06-30 MED ORDER — PANTOPRAZOLE SODIUM 40 MG PO TBEC
40.0000 mg | DELAYED_RELEASE_TABLET | Freq: Every day | ORAL | Status: DC
  Administered 2019-07-02 – 2019-07-06 (×5): 40 mg via ORAL
  Filled 2019-06-30 (×5): qty 1

## 2019-06-30 MED ORDER — SODIUM CHLORIDE 0.9% FLUSH: 3.0000 mL | INTRAVENOUS | Status: DC | PRN

## 2019-06-30 MED ORDER — SODIUM CHLORIDE 0.9 % IV SOLN
20.0000 ug | Freq: Once | INTRAVENOUS | Status: AC
  Administered 2019-06-30: 20 ug via INTRAVENOUS
  Filled 2019-06-30: qty 5

## 2019-06-30 MED ORDER — ROCURONIUM BROMIDE 10 MG/ML (PF) SYRINGE
PREFILLED_SYRINGE | INTRAVENOUS | Status: AC
  Filled 2019-06-30: qty 10

## 2019-06-30 MED ORDER — SODIUM CHLORIDE (PF) 0.9 % IJ SOLN
OROMUCOSAL | Status: DC | PRN
  Administered 2019-06-30 (×5): 4 mL via TOPICAL

## 2019-06-30 MED ORDER — METOPROLOL TARTRATE 12.5 MG HALF TABLET
12.5000 mg | ORAL_TABLET | Freq: Two times a day (BID) | ORAL | Status: DC
  Administered 2019-07-01: 12.5 mg via ORAL
  Filled 2019-06-30: qty 1

## 2019-06-30 MED ORDER — SODIUM CHLORIDE 0.9% IV SOLUTION: Freq: Once | INTRAVENOUS | Status: DC

## 2019-06-30 MED ORDER — PROTAMINE SULFATE 10 MG/ML IV SOLN
INTRAVENOUS | Status: DC | PRN
Start: 2019-06-30 — End: 2019-06-30
  Administered 2019-06-30: 340 mg via INTRAVENOUS

## 2019-06-30 MED ORDER — ROCURONIUM BROMIDE 10 MG/ML (PF) SYRINGE
PREFILLED_SYRINGE | INTRAVENOUS | Status: AC
  Filled 2019-06-30: qty 20

## 2019-06-30 MED ORDER — VANCOMYCIN HCL IN DEXTROSE 1-5 GM/200ML-% IV SOLN
1000.0000 mg | Freq: Two times a day (BID) | INTRAVENOUS | Status: AC
  Administered 2019-06-30 – 2019-07-01 (×2): 1000 mg via INTRAVENOUS
  Filled 2019-06-30 (×2): qty 200

## 2019-06-30 MED ORDER — ASPIRIN EC 325 MG PO TBEC
325.0000 mg | DELAYED_RELEASE_TABLET | Freq: Every day | ORAL | Status: DC
  Administered 2019-07-01 – 2019-07-06 (×4): 325 mg via ORAL
  Filled 2019-06-30 (×4): qty 1

## 2019-06-30 MED ORDER — FENTANYL CITRATE (PF) 250 MCG/5ML IJ SOLN
INTRAMUSCULAR | Status: AC
  Filled 2019-06-30: qty 25

## 2019-06-30 MED ORDER — ADULT MULTIVITAMIN W/MINERALS CH
1.0000 | ORAL_TABLET | Freq: Every day | ORAL | Status: DC
  Administered 2019-07-01 – 2019-07-06 (×6): 1 via ORAL
  Filled 2019-06-30 (×6): qty 1

## 2019-06-30 MED ORDER — HEPARIN SODIUM (PORCINE) 1000 UNIT/ML IJ SOLN
INTRAMUSCULAR | Status: DC | PRN
  Administered 2019-06-30: 32000 [IU] via INTRAVENOUS
  Administered 2019-06-30: 2000 [IU] via INTRAVENOUS

## 2019-06-30 MED ORDER — DEXTROSE 50 % IV SOLN: 0.0000 mL | INTRAVENOUS | Status: DC | PRN

## 2019-06-30 MED ORDER — INSULIN REGULAR(HUMAN) IN NACL 100-0.9 UT/100ML-% IV SOLN
INTRAVENOUS | Status: DC
  Administered 2019-07-01: 5.5 [IU]/h via INTRAVENOUS
  Filled 2019-06-30: qty 100

## 2019-06-30 MED ORDER — LACTATED RINGERS IV SOLN: INTRAVENOUS | Status: DC | PRN

## 2019-06-30 MED ORDER — BISACODYL 10 MG RE SUPP: 10.0000 mg | Freq: Every day | RECTAL | Status: DC

## 2019-06-30 MED ORDER — OXYCODONE HCL 5 MG PO TABS
5.0000 mg | ORAL_TABLET | ORAL | Status: DC | PRN
  Administered 2019-06-30 – 2019-07-01 (×2): 10 mg via ORAL
  Administered 2019-07-01: 5 mg via ORAL
  Administered 2019-07-01 – 2019-07-02 (×2): 10 mg via ORAL
  Filled 2019-06-30 (×3): qty 2
  Filled 2019-06-30: qty 1
  Filled 2019-06-30: qty 2

## 2019-06-30 MED ORDER — MAGNESIUM SULFATE 4 GM/100ML IV SOLN
4.0000 g | Freq: Once | INTRAVENOUS | Status: AC
  Administered 2019-06-30: 4 g via INTRAVENOUS

## 2019-06-30 MED ORDER — MIDAZOLAM HCL 2 MG/2ML IJ SOLN: 2.0000 mg | INTRAMUSCULAR | Status: DC | PRN

## 2019-06-30 MED ORDER — FENTANYL CITRATE (PF) 250 MCG/5ML IJ SOLN
INTRAMUSCULAR | Status: DC | PRN
  Administered 2019-06-30: 100 ug via INTRAVENOUS
  Administered 2019-06-30: 50 ug via INTRAVENOUS
  Administered 2019-06-30: 150 ug via INTRAVENOUS
  Administered 2019-06-30 (×2): 100 ug via INTRAVENOUS
  Administered 2019-06-30: 200 ug via INTRAVENOUS
  Administered 2019-06-30: 100 ug via INTRAVENOUS
  Administered 2019-06-30: 150 ug via INTRAVENOUS
  Administered 2019-06-30: 300 ug via INTRAVENOUS

## 2019-06-30 MED ORDER — SODIUM CHLORIDE 0.9 % IV SOLN
1.5000 g | Freq: Two times a day (BID) | INTRAVENOUS | Status: AC
  Administered 2019-06-30 – 2019-07-02 (×4): 1.5 g via INTRAVENOUS
  Filled 2019-06-30 (×4): qty 1.5

## 2019-06-30 MED ORDER — SODIUM CHLORIDE 0.9% FLUSH
3.0000 mL | Freq: Two times a day (BID) | INTRAVENOUS | Status: DC
  Administered 2019-07-01: 3 mL via INTRAVENOUS
  Administered 2019-07-01: 10 mL via INTRAVENOUS
  Administered 2019-07-02 (×2): 3 mL via INTRAVENOUS

## 2019-06-30 MED ORDER — METOPROLOL TARTRATE 5 MG/5ML IV SOLN: 2.5000 mg | INTRAVENOUS | Status: DC | PRN

## 2019-06-30 MED ORDER — FAMOTIDINE IN NACL 20-0.9 MG/50ML-% IV SOLN
20.0000 mg | Freq: Two times a day (BID) | INTRAVENOUS | Status: AC
  Administered 2019-06-30 (×2): 20 mg via INTRAVENOUS
  Filled 2019-06-30: qty 50

## 2019-06-30 MED ORDER — DEXMEDETOMIDINE HCL IN NACL 400 MCG/100ML IV SOLN: 0.0000 ug/kg/h | INTRAVENOUS | Status: DC

## 2019-06-30 MED ORDER — PLASMA-LYTE 148 IV SOLN
INTRAVENOUS | Status: DC | PRN
  Administered 2019-06-30: 500 mL

## 2019-06-30 MED ORDER — LACTATED RINGERS IV SOLN
INTRAVENOUS | Status: DC | PRN
Start: 2019-06-30 — End: 2019-06-30

## 2019-06-30 MED ORDER — ACETAMINOPHEN 500 MG PO TABS
1000.0000 mg | ORAL_TABLET | Freq: Four times a day (QID) | ORAL | Status: AC
  Administered 2019-06-30 – 2019-07-04 (×10): 1000 mg via ORAL
  Filled 2019-06-30 (×11): qty 2

## 2019-06-30 MED ORDER — HEPARIN SODIUM (PORCINE) 1000 UNIT/ML IJ SOLN
INTRAMUSCULAR | Status: AC
  Filled 2019-06-30: qty 1

## 2019-06-30 MED ORDER — ROSUVASTATIN CALCIUM 20 MG PO TABS
40.0000 mg | ORAL_TABLET | Freq: Every day | ORAL | Status: DC
  Administered 2019-06-30 – 2019-07-05 (×6): 40 mg via ORAL
  Filled 2019-06-30 (×6): qty 2

## 2019-06-30 MED ORDER — METOCLOPRAMIDE HCL 5 MG/ML IJ SOLN
10.0000 mg | Freq: Four times a day (QID) | INTRAMUSCULAR | Status: DC
  Administered 2019-06-30 – 2019-07-03 (×11): 10 mg via INTRAVENOUS
  Filled 2019-06-30 (×11): qty 2

## 2019-06-30 MED ORDER — NITROGLYCERIN 0.2 MG/ML ON CALL CATH LAB
INTRAVENOUS | Status: DC | PRN
  Administered 2019-06-30: 40 ug via INTRAVENOUS
  Administered 2019-06-30: 20 ug via INTRAVENOUS

## 2019-06-30 MED ORDER — CHLORHEXIDINE GLUCONATE 0.12 % MT SOLN
15.0000 mL | OROMUCOSAL | Status: AC
  Administered 2019-06-30: 15 mL via OROMUCOSAL

## 2019-06-30 MED ORDER — PROPOFOL 10 MG/ML IV BOLUS
INTRAVENOUS | Status: AC
  Filled 2019-06-30: qty 20

## 2019-06-30 MED ORDER — TRAMADOL HCL 50 MG PO TABS
50.0000 mg | ORAL_TABLET | ORAL | Status: DC | PRN
  Administered 2019-06-30 – 2019-07-01 (×5): 50 mg via ORAL
  Filled 2019-06-30 (×5): qty 1

## 2019-06-30 MED ORDER — ASPIRIN 81 MG PO CHEW
324.0000 mg | CHEWABLE_TABLET | Freq: Every day | ORAL | Status: DC
  Administered 2019-07-02 – 2019-07-03 (×2): 324 mg
  Filled 2019-06-30 (×2): qty 4

## 2019-06-30 MED ORDER — NOREPINEPHRINE 4 MG/250ML-% IV SOLN
0.0000 ug/min | INTRAVENOUS | Status: DC
  Administered 2019-07-01: 4 ug/min via INTRAVENOUS
  Filled 2019-06-30: qty 250

## 2019-06-30 MED ORDER — SODIUM CHLORIDE 0.45 % IV SOLN: INTRAVENOUS | Status: DC | PRN

## 2019-06-30 MED ORDER — ACETAMINOPHEN 160 MG/5ML PO SOLN
650.0000 mg | Freq: Once | ORAL | Status: AC
  Administered 2019-06-30: 650 mg
  Filled 2019-06-30: qty 20.3

## 2019-06-30 MED ORDER — POTASSIUM CHLORIDE 10 MEQ/50ML IV SOLN
10.0000 meq | INTRAVENOUS | Status: AC
  Administered 2019-06-30 (×3): 10 meq via INTRAVENOUS

## 2019-06-30 MED ORDER — PROTAMINE SULFATE 10 MG/ML IV SOLN
INTRAVENOUS | Status: AC
  Filled 2019-06-30: qty 10

## 2019-06-30 MED ORDER — MORPHINE SULFATE (PF) 2 MG/ML IV SOLN
1.0000 mg | INTRAVENOUS | Status: DC | PRN
  Administered 2019-07-01 (×3): 2 mg via INTRAVENOUS
  Filled 2019-06-30 (×4): qty 1

## 2019-06-30 MED ORDER — VANCOMYCIN HCL IN DEXTROSE 1-5 GM/200ML-% IV SOLN: 1000.0000 mg | Freq: Once | INTRAVENOUS | Status: DC

## 2019-06-30 MED ORDER — PROPOFOL 10 MG/ML IV BOLUS
INTRAVENOUS | Status: DC | PRN
  Administered 2019-06-30 (×2): 20 mg via INTRAVENOUS
  Administered 2019-06-30: 70 mg via INTRAVENOUS
  Administered 2019-06-30: 20 mg via INTRAVENOUS

## 2019-06-30 MED ORDER — PHENYLEPHRINE 40 MCG/ML (10ML) SYRINGE FOR IV PUSH (FOR BLOOD PRESSURE SUPPORT)
PREFILLED_SYRINGE | INTRAVENOUS | Status: AC
  Filled 2019-06-30: qty 10

## 2019-06-30 MED ORDER — ACETAMINOPHEN 160 MG/5ML PO SOLN: 1000.0000 mg | Freq: Four times a day (QID) | ORAL | Status: AC

## 2019-06-30 MED ORDER — ALBUMIN HUMAN 5 % IV SOLN: INTRAVENOUS | Status: DC | PRN

## 2019-06-30 MED ORDER — CALCIUM GLUCONATE-NACL 1-0.675 GM/50ML-% IV SOLN
1.0000 g | Freq: Once | INTRAVENOUS | Status: AC
  Administered 2019-06-30: 1000 mg via INTRAVENOUS

## 2019-06-30 MED ORDER — NITROGLYCERIN IN D5W 200-5 MCG/ML-% IV SOLN: 0.0000 ug/min | INTRAVENOUS | Status: DC

## 2019-06-30 MED ORDER — SODIUM CHLORIDE 0.9 % IV SOLN: INTRAVENOUS | Status: DC

## 2019-06-30 MED ORDER — LACTATED RINGERS IV SOLN: INTRAVENOUS | Status: DC

## 2019-06-30 MED ORDER — METOPROLOL TARTRATE 12.5 MG HALF TABLET
12.5000 mg | ORAL_TABLET | Freq: Once | ORAL | Status: AC
  Administered 2019-06-30: 12.5 mg via ORAL
  Filled 2019-06-30: qty 1

## 2019-06-30 MED ORDER — CHLORHEXIDINE GLUCONATE CLOTH 2 % EX PADS
6.0000 | MEDICATED_PAD | Freq: Every day | CUTANEOUS | Status: DC
  Administered 2019-06-30 – 2019-07-04 (×4): 6 via TOPICAL

## 2019-06-30 MED ORDER — ORAL CARE MOUTH RINSE: 15.0000 mL | Freq: Once | OROMUCOSAL | Status: DC

## 2019-06-30 MED ORDER — ACETAMINOPHEN 650 MG RE SUPP: 650.0000 mg | Freq: Once | RECTAL | Status: AC

## 2019-06-30 MED ORDER — CHLORHEXIDINE GLUCONATE 0.12 % MT SOLN: 15.0000 mL | Freq: Once | OROMUCOSAL | Status: DC

## 2019-06-30 MED ORDER — PHENYLEPHRINE HCL-NACL 20-0.9 MG/250ML-% IV SOLN: 0.0000 ug/min | INTRAVENOUS | Status: DC

## 2019-06-30 MED ORDER — MIDAZOLAM HCL 2 MG/2ML IJ SOLN
INTRAMUSCULAR | Status: AC
  Filled 2019-06-30: qty 2

## 2019-06-30 MED ORDER — MIDAZOLAM HCL (PF) 10 MG/2ML IJ SOLN
INTRAMUSCULAR | Status: AC
  Filled 2019-06-30: qty 2

## 2019-06-30 MED ORDER — ROCURONIUM BROMIDE 10 MG/ML (PF) SYRINGE
PREFILLED_SYRINGE | INTRAVENOUS | Status: DC | PRN
  Administered 2019-06-30 (×3): 100 mg via INTRAVENOUS
  Administered 2019-06-30: 60 mg via INTRAVENOUS
  Administered 2019-06-30: 40 mg via INTRAVENOUS

## 2019-06-30 MED ORDER — MULTI-VITAMIN/MINERALS PO TABS: 1.0000 | ORAL_TABLET | Freq: Every day | ORAL | Status: DC

## 2019-06-30 MED ORDER — LACTATED RINGERS IV SOLN
500.0000 mL | Freq: Once | INTRAVENOUS | Status: AC | PRN
  Administered 2019-06-30: 500 mL via INTRAVENOUS

## 2019-06-30 MED ORDER — HEMOSTATIC AGENTS (NO CHARGE) OPTIME
TOPICAL | Status: DC | PRN
  Administered 2019-06-30 (×4): 1 via TOPICAL

## 2019-06-30 MED ORDER — MILRINONE LACTATE IN DEXTROSE 20-5 MG/100ML-% IV SOLN
0.1250 ug/kg/min | INTRAVENOUS | Status: DC
  Administered 2019-06-30: 0.25 ug/kg/min via INTRAVENOUS
  Administered 2019-07-01: 0.125 ug/kg/min via INTRAVENOUS
  Filled 2019-06-30 (×2): qty 100

## 2019-06-30 MED ORDER — 0.9 % SODIUM CHLORIDE (POUR BTL) OPTIME
TOPICAL | Status: DC | PRN
  Administered 2019-06-30: 6000 mL

## 2019-06-30 MED ORDER — PROTAMINE SULFATE 10 MG/ML IV SOLN
INTRAVENOUS | Status: AC
  Filled 2019-06-30: qty 25

## 2019-06-30 MED ORDER — ROCURONIUM BROMIDE 100 MG/10ML IV SOLN: INTRAVENOUS | Status: DC | PRN

## 2019-06-30 MED ORDER — METOPROLOL TARTRATE 25 MG/10 ML ORAL SUSPENSION: 12.5000 mg | Freq: Two times a day (BID) | ORAL | Status: DC

## 2019-06-30 MED ORDER — PHENYLEPHRINE HCL (PRESSORS) 10 MG/ML IV SOLN
INTRAVENOUS | Status: DC | PRN
  Administered 2019-06-30: 80 ug via INTRAVENOUS
  Administered 2019-06-30: 20 ug via INTRAVENOUS
  Administered 2019-06-30: 80 ug via INTRAVENOUS

## 2019-06-30 MED ORDER — ALBUMIN HUMAN 5 % IV SOLN
250.0000 mL | INTRAVENOUS | Status: AC | PRN
  Administered 2019-06-30 (×4): 12.5 g via INTRAVENOUS
  Filled 2019-06-30 (×2): qty 250

## 2019-06-30 MED ORDER — ONDANSETRON HCL 4 MG/2ML IJ SOLN
4.0000 mg | Freq: Four times a day (QID) | INTRAMUSCULAR | Status: DC | PRN
  Administered 2019-06-30: 4 mg via INTRAVENOUS
  Filled 2019-06-30: qty 2

## 2019-06-30 MED ORDER — SODIUM CHLORIDE 0.9 % IV SOLN: 250.0000 mL | INTRAVENOUS | Status: DC

## 2019-06-30 MED ORDER — CHLORHEXIDINE GLUCONATE 0.12 % MT SOLN
15.0000 mL | Freq: Once | OROMUCOSAL | Status: AC
  Administered 2019-06-30: 15 mL via OROMUCOSAL
  Filled 2019-06-30: qty 15

## 2019-06-30 MED ORDER — MIDAZOLAM HCL 5 MG/5ML IJ SOLN
INTRAMUSCULAR | Status: DC | PRN
  Administered 2019-06-30 (×6): 2 mg via INTRAVENOUS

## 2019-06-30 SURGICAL SUPPLY — 111 items
ADAPTER CARDIO PERF ANTE/RETRO (ADAPTER) ×5 IMPLANT
BAG DECANTER FOR FLEXI CONT (MISCELLANEOUS) ×5 IMPLANT
BLADE CLIPPER SURG (BLADE) IMPLANT
BLADE MINI RND TIP GREEN BEAV (BLADE) ×5 IMPLANT
BLADE STERNUM SYSTEM 6 (BLADE) ×5 IMPLANT
BLADE SURG 11 STRL SS (BLADE) ×5 IMPLANT
BLADE SURG 12 STRL SS (BLADE) ×5 IMPLANT
BNDG ELASTIC 4X5.8 VLCR STR LF (GAUZE/BANDAGES/DRESSINGS) ×10 IMPLANT
BNDG ELASTIC 6X5.8 VLCR STR LF (GAUZE/BANDAGES/DRESSINGS) ×10 IMPLANT
BNDG GAUZE ELAST 4 BULKY (GAUZE/BANDAGES/DRESSINGS) ×10 IMPLANT
CANISTER SUCT 3000ML PPV (MISCELLANEOUS) ×5 IMPLANT
CANNULA GUNDRY RCSP 15FR (MISCELLANEOUS) ×5 IMPLANT
CANNULA VESSEL 3MM 2 BLNT TIP (CANNULA) ×5 IMPLANT
CATH CPB KIT VANTRIGT (MISCELLANEOUS) ×5 IMPLANT
CATH ROBINSON RED A/P 18FR (CATHETERS) ×15 IMPLANT
CATH THORACIC 28FR RT ANG (CATHETERS) ×15 IMPLANT
CLIP FOGARTY SPRING 6M (CLIP) ×5 IMPLANT
CLIP VESOCCLUDE MED 24/CT (CLIP) ×5 IMPLANT
CLIP VESOCCLUDE SM WIDE 24/CT (CLIP) ×15 IMPLANT
COVER SURGICAL LIGHT HANDLE (MISCELLANEOUS) ×5 IMPLANT
DERMABOND ADVANCED (GAUZE/BANDAGES/DRESSINGS) ×4
DERMABOND ADVANCED .7 DNX12 (GAUZE/BANDAGES/DRESSINGS) ×6 IMPLANT
DRAIN CHANNEL 32F RND 10.7 FF (WOUND CARE) ×5 IMPLANT
DRAPE CARDIOVASCULAR INCISE (DRAPES) ×2
DRAPE SLUSH/WARMER DISC (DRAPES) ×5 IMPLANT
DRAPE SRG 135X102X78XABS (DRAPES) ×3 IMPLANT
DRSG AQUACEL AG ADV 3.5X14 (GAUZE/BANDAGES/DRESSINGS) ×5 IMPLANT
ELECT BLADE 4.0 EZ CLEAN MEGAD (MISCELLANEOUS) ×5
ELECT BLADE 6.5 EXT (BLADE) ×5 IMPLANT
ELECT CAUTERY BLADE 6.4 (BLADE) ×5 IMPLANT
ELECT REM PT RETURN 9FT ADLT (ELECTROSURGICAL) ×10
ELECTRODE BLDE 4.0 EZ CLN MEGD (MISCELLANEOUS) ×3 IMPLANT
ELECTRODE REM PT RTRN 9FT ADLT (ELECTROSURGICAL) ×6 IMPLANT
FELT TEFLON 1X6 (MISCELLANEOUS) ×5 IMPLANT
GAUZE SPONGE 4X4 12PLY STRL (GAUZE/BANDAGES/DRESSINGS) ×10 IMPLANT
GAUZE SPONGE 4X4 12PLY STRL LF (GAUZE/BANDAGES/DRESSINGS) ×10 IMPLANT
GLOVE BIO SURGEON STRL SZ 6 (GLOVE) ×10 IMPLANT
GLOVE BIO SURGEON STRL SZ 6.5 (GLOVE) ×8 IMPLANT
GLOVE BIO SURGEON STRL SZ7.5 (GLOVE) ×20 IMPLANT
GLOVE BIO SURGEONS STRL SZ 6.5 (GLOVE) ×2
GLOVE BIOGEL PI IND STRL 6 (GLOVE) ×12 IMPLANT
GLOVE BIOGEL PI IND STRL 6.5 (GLOVE) ×6 IMPLANT
GLOVE BIOGEL PI IND STRL 7.0 (GLOVE) ×6 IMPLANT
GLOVE BIOGEL PI IND STRL 7.5 (GLOVE) ×6 IMPLANT
GLOVE BIOGEL PI INDICATOR 6 (GLOVE) ×8
GLOVE BIOGEL PI INDICATOR 6.5 (GLOVE) ×4
GLOVE BIOGEL PI INDICATOR 7.0 (GLOVE) ×4
GLOVE BIOGEL PI INDICATOR 7.5 (GLOVE) ×4
GLOVE INDICATOR 7.5 STRL GRN (GLOVE) ×15 IMPLANT
GLOVE SURG SS PI 7.0 STRL IVOR (GLOVE) ×25 IMPLANT
GOWN STRL REUS W/ TWL LRG LVL3 (GOWN DISPOSABLE) ×33 IMPLANT
GOWN STRL REUS W/TWL LRG LVL3 (GOWN DISPOSABLE) ×22
HEMOSTAT POWDER SURGIFOAM 1G (HEMOSTASIS) ×25 IMPLANT
HEMOSTAT SURGICEL 2X14 (HEMOSTASIS) ×5 IMPLANT
INSERT FOGARTY XLG (MISCELLANEOUS) IMPLANT
KIT BASIN OR (CUSTOM PROCEDURE TRAY) ×5 IMPLANT
KIT SUCTION CATH 14FR (SUCTIONS) ×5 IMPLANT
KIT TURNOVER KIT B (KITS) ×5 IMPLANT
KIT VASOVIEW HEMOPRO 2 VH 4000 (KITS) ×5 IMPLANT
LEAD PACING MYOCARDI (MISCELLANEOUS) ×5 IMPLANT
MARKER GRAFT CORONARY BYPASS (MISCELLANEOUS) ×15 IMPLANT
NS IRRIG 1000ML POUR BTL (IV SOLUTION) ×30 IMPLANT
PACK E OPEN HEART (SUTURE) ×5 IMPLANT
PACK OPEN HEART (CUSTOM PROCEDURE TRAY) ×5 IMPLANT
PAD ARMBOARD 7.5X6 YLW CONV (MISCELLANEOUS) ×10 IMPLANT
PAD ELECT DEFIB RADIOL ZOLL (MISCELLANEOUS) ×5 IMPLANT
PENCIL BUTTON HOLSTER BLD 10FT (ELECTRODE) ×5 IMPLANT
POSITIONER HEAD DONUT 9IN (MISCELLANEOUS) ×5 IMPLANT
PUNCH AORTIC ROTATE 4.0MM (MISCELLANEOUS) IMPLANT
PUNCH AORTIC ROTATE 4.5MM 8IN (MISCELLANEOUS) ×5 IMPLANT
PUNCH AORTIC ROTATE 5MM 8IN (MISCELLANEOUS) IMPLANT
SEALANT PATCH FIBRIN 2X4IN (MISCELLANEOUS) ×5 IMPLANT
SPONGE LAP 18X18 RF (DISPOSABLE) ×15 IMPLANT
SPONGE LAP 4X18 RFD (DISPOSABLE) ×10 IMPLANT
SUPPORT HEART JANKE-BARRON (MISCELLANEOUS) ×5 IMPLANT
SURGIFLO W/THROMBIN 8M KIT (HEMOSTASIS) ×10 IMPLANT
SUT BONE WAX W31G (SUTURE) ×5 IMPLANT
SUT MNCRL AB 4-0 PS2 18 (SUTURE) ×10 IMPLANT
SUT PROLENE 3 0 SH DA (SUTURE) IMPLANT
SUT PROLENE 3 0 SH1 36 (SUTURE) IMPLANT
SUT PROLENE 4 0 RB 1 (SUTURE) ×4
SUT PROLENE 4 0 SH DA (SUTURE) ×5 IMPLANT
SUT PROLENE 4-0 RB1 .5 CRCL 36 (SUTURE) ×6 IMPLANT
SUT PROLENE 5 0 C 1 36 (SUTURE) IMPLANT
SUT PROLENE 6 0 C 1 30 (SUTURE) ×10 IMPLANT
SUT PROLENE 6 0 CC (SUTURE) ×60 IMPLANT
SUT PROLENE 7 0 BV 1 (SUTURE) ×40 IMPLANT
SUT PROLENE 8 0 BV175 6 (SUTURE) ×10 IMPLANT
SUT PROLENE BLUE 7 0 (SUTURE) ×10 IMPLANT
SUT PROLENE POLY MONO (SUTURE) ×15 IMPLANT
SUT SILK  1 MH (SUTURE) ×2
SUT SILK 1 MH (SUTURE) ×3 IMPLANT
SUT SILK 2 0 SH CR/8 (SUTURE) IMPLANT
SUT SILK 3 0 SH CR/8 (SUTURE) ×5 IMPLANT
SUT STEEL 6MS V (SUTURE) ×5 IMPLANT
SUT STEEL SZ 6 DBL 3X14 BALL (SUTURE) ×10 IMPLANT
SUT VIC AB 1 CTX 36 (SUTURE) ×12
SUT VIC AB 1 CTX36XBRD ANBCTR (SUTURE) ×18 IMPLANT
SUT VIC AB 2-0 CT1 27 (SUTURE) ×4
SUT VIC AB 2-0 CT1 TAPERPNT 27 (SUTURE) ×6 IMPLANT
SUT VIC AB 2-0 CTX 27 (SUTURE) IMPLANT
SUT VIC AB 3-0 X1 27 (SUTURE) IMPLANT
SYSTEM SAHARA CHEST DRAIN ATS (WOUND CARE) ×10 IMPLANT
TAPE CLOTH SURG 4X10 WHT LF (GAUZE/BANDAGES/DRESSINGS) ×5 IMPLANT
TAPE PAPER 3X10 WHT MICROPORE (GAUZE/BANDAGES/DRESSINGS) ×5 IMPLANT
TOWEL GREEN STERILE (TOWEL DISPOSABLE) ×5 IMPLANT
TOWEL GREEN STERILE FF (TOWEL DISPOSABLE) ×5 IMPLANT
TRAY FOLEY SLVR 16FR TEMP STAT (SET/KITS/TRAYS/PACK) ×5 IMPLANT
TUBING LAP HI FLOW INSUFFLATIO (TUBING) ×5 IMPLANT
UNDERPAD 30X36 HEAVY ABSORB (UNDERPADS AND DIAPERS) ×5 IMPLANT
WATER STERILE IRR 1000ML POUR (IV SOLUTION) ×10 IMPLANT

## 2019-06-30 NOTE — Brief Op Note (Signed)
06/30/2019  12:39 PM  PATIENT:  Phillip [name]  69 y.o. male  PRE-OPERATIVE DIAGNOSIS:  Coronary Artery Disease  POST-OPERATIVE DIAGNOSIS:  Coronary Artery Disease  PROCEDURE:  TRANSESOPHAGEAL ECHOCARDIOGRAM (TEE), CORONARY ARTERY BYPASS GRAFTING (CABG) x 4 (LIMA to DIAGONAL, SVG to OM, SVG to RAMUS INTERMEDIATE, SVG to PDA) with ENDOVEIN HARVEST OF GREATER SAPHENOUS VEIN (RIGHT and PARTIAL LEFT THIGH) and LEFT INTERNAL MAMMARY ARTERY  SURGEON:  Surgeon(s) and Role:  Ivin Poot, MD - Primary  PHYSICIAN ASSISTANT: Lars Pinks PA-C  ANESTHESIA:   general  EBL:  Per perfusion and anesthesia record  DRAINS: Chest tubes placed in the mediastinal and pleural spaces   COUNTS CORRECT:  YES  DICTATION: .Dragon Dictation  PLAN OF CARE: Admit to inpatient   PATIENT DISPOSITION:  ICU - intubated and hemodynamically stable.   Delay start of Pharmacological VTE agent (>24hrs) due to surgical blood loss or risk of bleeding: yes  BASELINE WEIGHT: 97 kg

## 2019-06-30 NOTE — Anesthesia Procedure Notes (Signed)
Central Venous Catheter Insertion Performed by: Oleta Mouse, MD, anesthesiologist Start/End6/22/2021 7:02 AM, 06/30/2019 7:12 AM Patient location: Pre-op. Preanesthetic checklist: patient identified, IV checked, site marked, risks and benefits discussed, surgical consent, monitors and equipment checked, pre-op evaluation, timeout performed and anesthesia consent Hand hygiene performed  and maximum sterile barriers used  PA cath was placed.[place] type:thermodilution Procedure performed without using ultrasound guided technique. Attempts: 1 Patient tolerated the procedure well with no immediate complications.

## 2019-06-30 NOTE — Progress Notes (Signed)
      [address]       [place] [address]             [phone]      S/p CABG x 4  Extubated  BP 108/75   Pulse 91   Temp 98.6 F (37 C)   Resp 18   Ht 5\' 7"  (1.702 m)   Wt 97.1 kg   SpO2 97%   BMI 33.53 kg/m  22/16 CI= 2.8 Milrinone 0.25, norepi 6   Intake/Output Summary (Last 24 hours) at 06/30/2019 1855 Last data filed at 06/30/2019 1800 Gross per 24 hour  Intake 5619.67 ml  Output 3858 ml  Net 1761.67 ml   Hct= 29, K was 3.3 - supplemented  Doing well early postop  Remo Lipps C. Roxan Hockey, MD [facility] [phone]

## 2019-06-30 NOTE — H&P (Signed)
PCP is Vertell Limber, [place] Chard, MD Referring Provider is No ref. provider found  No chief complaint on file. Patient examined, images of coronary arteriogram and echocardiogram performed at [facility] personally reviewed and discussed with patient.  Those images have been loaded into the [employer] cardiac imaging system at [facility]  HPI: 69 year old none smoker with past history of type 2 diabetes presents for discussion of recently diagnosed severe three-vessel coronary disease.  Patient was seen by Dr. Oval Linsey for exertional shortness of breath and a decline in exercise tolerance.  He had previously undergone a stress test followed by coronary arteriogram and echocardiogram at [facility].  These images show chronic occlusion of the LAD collateralized from the right coronary system.  The mid RCA has an 80% stenosis.  The ramus ramus intermediate has a proximal 80% stenosis in the left circumflex marginal has an 80% stenosis.  LVEDP was 14 mmHg.  The echocardiogram showed some apical hypokinesia but EF approximately 50%.  The aortic valve has some thickening but gradient measurements show to be mild aortic stenosis.  Patient has strong family history for CAD in his father grandfather and uncles.  He has dyslipidemia and high blood pressure. He states he was told he had a murmur in the past he denies history of rheumatic heart disease or scarlet fever during his childhood.  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  He denies bleeding diathesis.  he is compliant with his medications and follows a heart healthy diet.  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   Past Medical History:  Diagnosis Date  . Aortic stenosis    mild to moderate AS 05/2019 echo   . CAD in native artery 06/09/2019  . Diabetes mellitus type 2 in obese ([place]) 06/09/2019  . Essential  hypertension 06/09/2019  . Melanoma ([place])    back  . Pure hypercholesterolemia 06/09/2019    Past Surgical History:  Procedure Laterality Date  . MELANOMA EXCISION     back  . THYROGLOSSAL DUCT CYST      Family History  Problem Relation Age of Onset  . CAD Father   . Valvular heart disease Father   . Heart disease Maternal Uncle   . Heart disease Maternal Grandfather     Social History Social History   Tobacco Use  . Smoking status: Former Smoker    Quit date: 1979    Years since quitting: 42.5  . Smokeless tobacco: Never Used  Vaping Use  . Vaping Use: Never used  Substance Use Topics  . Alcohol use: Yes    Alcohol/week: 6.0 - 8.0 standard drinks    Types: 6 - 8 Glasses of wine per week    Comment: wine   . Drug use: Not on file    Current Facility-Administered Medications  Medication Dose Route Frequency Provider Last Rate Last Admin  . 0.9 % irrigation (POUR BTL)    PRN Prescott Gum, Collier Salina, MD   5,000 mL at 06/30/19 0723  . cefUROXime (ZINACEF) 1.5 g in sodium chloride 0.9 % 100 mL IVPB  1.5 g Intravenous To OR Prescott Gum, Collier Salina, MD      . cefUROXime (ZINACEF) 750 mg in sodium chloride 0.9 % 100 mL IVPB  750 mg Intravenous To OR Prescott Gum, Collier Salina, MD      . chlorhexidine (HIBICLENS) 4 % liquid 2 application  30 mL Topical UD  Prescott Gum, Collier Salina, MD      . chlorhexidine (PERIDEX) 0.12 % solution 15 mL  15 mL Mouth/Throat Once Oleta Mouse, MD       Or  . MEDLINE mouth rinse  15 mL Mouth Rinse Once Oleta Mouse, MD      . dexmedetomidine (PRECEDEX) 400 MCG/100ML (4 mcg/mL) infusion  0.1-0.7 mcg/kg/hr Intravenous To OR Prescott Gum, Collier Salina, MD      . EPINEPHrine (ADRENALIN) 4 mg in NS 250 mL (0.016 mg/mL) premix infusion  0-10 mcg/min Intravenous To OR Ivin Poot, MD      . hemostatic agents    PRN Prescott Gum, Collier Salina, MD   1 application at 67/89/38 [phone]  . heparin 30,000 units/NS 1000 mL solution for CELLSAVER   Other To OR Prescott Gum, Collier Salina, MD      . heparin  sodium (porcine) 2,500 Units, papaverine 30 mg in electrolyte-148 (PLASMALYTE-148) 500 mL irrigation   Irrigation To OR Prescott Gum, Collier Salina, MD      . heparin sodium (porcine) 2,500 Units, papaverine 30 mg in electrolyte-148 (PLASMALYTE-148) 500 mL irrigation    PRN Prescott Gum, Collier Salina, MD   500 mL at 06/30/19 0727  . insulin regular, human (MYXREDLIN) 100 units/ 100 mL infusion   Intravenous To OR Prescott Gum, Collier Salina, MD      . magnesium sulfate (IV Push/IM) injection 40 mEq  40 mEq Other To OR Prescott Gum, Collier Salina, MD      . milrinone (PRIMACOR) 20 MG/100 ML (0.2 mg/mL) infusion  0.3 mcg/kg/min Intravenous To OR Prescott Gum, Collier Salina, MD      . nitroGLYCERIN 50 mg in dextrose 5 % 250 mL (0.2 mg/mL) infusion  2-200 mcg/min Intravenous To OR Prescott Gum, Collier Salina, MD      . norepinephrine (LEVOPHED) 4mg  in 241mL premix infusion  0-40 mcg/min Intravenous To OR Prescott Gum, Collier Salina, MD      . phenylephrine (NEOSYNEPHRINE) 20-0.9 MG/250ML-% infusion  30-200 mcg/min Intravenous To OR Prescott Gum, Collier Salina, MD      . potassium chloride injection 80 mEq  80 mEq Other To OR Prescott Gum, Collier Salina, MD      . Surgifoam 1 Gm with 0.9% sodium chloride (4 ml) topical solution    PRN Prescott Gum, Collier Salina, MD   4 mL at 06/30/19 0725  . tranexamic acid (CYKLOKAPRON) 2,500 mg in sodium chloride 0.9 % 250 mL (10 mg/mL) infusion  1.5 mg/kg/hr Intravenous To OR Prescott Gum, Collier Salina, MD      . tranexamic acid (CYKLOKAPRON) bolus via infusion - over 30 minutes 1,456.5 mg  15 mg/kg Intravenous To OR Prescott Gum, Collier Salina, MD      . tranexamic acid (CYKLOKAPRON) pump prime solution 194 mg  2 mg/kg Intracatheter To OR Prescott Gum, Collier Salina, MD      . vancomycin Alcus Dad) IVPB 1500 mg/300 mL  1,500 mg Intravenous To OR Ivin Poot, MD       Facility-Administered Medications Ordered in Other Encounters  Medication Dose Route Frequency Provider Last Rate Last Admin  . fentaNYL citrate (PF) (SUBLIMAZE) injection   Intravenous Anesthesia Intra-op Everlean Cherry A, CRNA   50  mcg at 06/30/19 [phone]  . lactated ringers infusion   Intravenous Continuous PRN Colin Benton, CRNA   New Bag at 06/30/19 0645  . lactated ringers infusion   Intravenous Continuous PRN Colin Benton, CRNA   New Bag at 06/30/19 [phone]  . midazolam (VERSED) 5 MG/5ML injection   Intravenous Anesthesia Intra-op Colin Benton,  CRNA   2 mg at 06/30/19 0700    No Known Allergies                     Review of Systems :  [ y ] = yes, [  ] = no        General :  Weight gain [   ]    Weight loss  [   ]  Fatigue [  ]  Fever [  ]  Chills  [  ]                                          HEENT    Headache [  ]  Dizziness [  ]  Blurred vision [  ] Glaucoma  [  ]                          Nosebleeds [  ] Painful or loose teeth [  ]        Cardiac :  Chest pain/ pressure [  ]  Resting SOB [  ] exertional SOB Blue.Reese  ]                        Orthopnea [  ]  Pedal edema  [  ]  Palpitations [  ] Syncope/presyncope [ ]                         Paroxysmal nocturnal dyspnea [  ]         Pulmonary : cough [  ]  wheezing [  ]  Hemoptysis [  ] Sputum [  ] Snoring [  ]                              Pneumothorax [  ]  Sleep apnea [  ]        GI : Vomiting [  ]  Dysphagia [  ]  Melena  [  ]  Abdominal pain [  ] BRBPR [  ]              Heart burn [  ]  Constipation [  ] Diarrhea  [  ] Colonoscopy Blue.Reese   ]        GU : Hematuria [  ]  Dysuria [  ]  Nocturia [  ] UTI's [  ]        Vascular : Claudication [  ]  Rest pain [  ]  DVT [  ] Vein stripping [  ] leg ulcers [  ]                          TIA [  ] Stroke [  ]  Varicose veins [  ]        NEURO :  Headaches  [  ] Seizures [  ] Vision changes [  ] Paresthesias [  ]  Musculoskeletal :  Arthritis [  ] Gout  [  ]  Back pain [  ]  Joint pain [  ]        Skin :  Rash [  ]  Melanoma [ y, resected from back with clear margins] Sores [  ]        Heme : Bleeding problems [  ]Clotting Disorders [  ] Anemia [  ]Blood Transfusion  [ ]         Endocrine : Diabetes [currently A1c less than 7] Heat or Cold intolerance [  ] Polyuria [  ]excessive thirst [ ]         Psych : Depression [  ]  Anxiety [  ]  Psych hospitalizations [  ] Memory change [  ]                                                                            BP 132/76   Pulse 72   Temp 98.2 F (36.8 C)   Resp 18   Ht 5\' 7"  (1.702 m)   Wt 97.1 kg   SpO2 99%   BMI 33.53 kg/m  Physical Exam      Physical Exam  General: Very intelligent well-nourished middle-aged male no acute distress HEENT: Normocephalic pupils equal , dentition adequate Neck: Supple without JVD, adenopathy, or bruit Chest: Clear to auscultation, symmetrical breath sounds, no rhonchi, no tenderness             or deformity Cardiovascular: Regular rate and rhythm, no murmur, 2/6 AS murmur, no gallop, peripheral pulses             palpable in all extremities Abdomen:  Soft, nontender, no palpable mass or organomegaly Extremities: Warm, well-perfused, no clubbing cyanosis edema or tenderness,              no venous stasis changes of the legs Rectal/GU: Deferred Neuro: Grossly non--focal and symmetrical throughout Skin: Clean and dry without rash or ulceration   Diagnostic Tests: Coronary xerograms show severe three-vessel coronary disease with preserved LV systolic function.  Echocardiogram shows mild thickening of the aortic valve with gradient consistent with mild aortic stenosis  Impression: Patient has severe multivessel coronary disease with minimal symptoms.  He totally occluded his LAD without symptoms.  Fortunately his LV function remains preserved.  However he should proceed with multivessel coronary bypass grafting as his best long-term therapy to preserve LV function and optimize survival.  Plan: Patient will be scheduled for multivessel CABG on June 22 at [facility].  At that time the aortic valve will be further evaluated with TEE to see if it is  significant.  I have discussed the procedure of CABG in detail with the patient including the benefits risks alternatives and he agrees to proceed with surgery.   Len Childs, MD [facility] [phone]  Pre Procedure note for inpatients:   Kadrian Partch has been scheduled for Procedure(s): CORONARY ARTERY BYPASS GRAFTING (CABG) (N/A) TRANSESOPHAGEAL ECHOCARDIOGRAM (TEE) (N/A) today. The various methods of treatment have been discussed with the patient. After consideration of the risks, benefits and treatment options the patient has consented to the planned procedure.  The patient has been seen and labs reviewed. There are no changes in the patient's condition to prevent proceeding with the planned procedure today.  Recent labs:  Lab Results  Component Value Date   WBC 8.1 06/26/2019   HGB 16.3 06/26/2019   HCT 45.2 06/26/2019   PLT 205 06/26/2019   GLUCOSE 263 (H) 06/26/2019   ALT 56 (H) 06/26/2019   AST 32 06/26/2019   NA 136 06/26/2019   K 4.1 06/26/2019   CL 101 06/26/2019   CREATININE 0.63 06/26/2019   BUN 12 06/26/2019   CO2 24 06/26/2019   INR 1.1 06/26/2019   HGBA1C 8.0 (H) 06/26/2019    Len Childs, MD 06/30/2019 7:50 AM

## 2019-06-30 NOTE — Transfer of Care (Signed)
Immediate Anesthesia Transfer of Care Note  Patient: Phillip [name]  Procedure(s) Performed: CORONARY ARTERY BYPASS GRAFTING (CABG) x 4, LIMA TO DIAGONAL 1, SVG TO OML, SVG TO RAMUS, SVG TO PDA (N/A Chest) TRANSESOPHAGEAL ECHOCARDIOGRAM (TEE) (N/A ) ENDOVEIN HARVEST OF GREATER SAPHENOUS VEIN (Bilateral )  Patient Location: ICU  Anesthesia Type:General  Level of Consciousness: Patient remains intubated per anesthesia plan  Airway & Oxygen Therapy: Patient remains intubated per anesthesia plan and Patient placed on Ventilator (see vital sign flow sheet for setting)  Post-op Assessment: Report given to RN and Post -op Vital signs reviewed and stable  Post vital signs: Reviewed and stable  Last Vitals:  Vitals Value Taken Time  BP 111/47 06/30/19 1624  Temp    Pulse 90 06/30/19 1624  Resp 12 06/30/19 1624  SpO2 99 % 06/30/19 1624    Last Pain:  Vitals:   06/30/19 0604  PainSc: 0-No pain         Complications: No complications documented.

## 2019-06-30 NOTE — Anesthesia Procedure Notes (Signed)
Central Venous Catheter Insertion Performed by: Oleta Mouse, MD, anesthesiologist Start/End6/22/2021 7:02 AM, 06/30/2019 7:12 AM Patient location: Pre-op. Preanesthetic checklist: patient identified, IV checked, site marked, risks and benefits discussed, surgical consent, monitors and equipment checked, pre-op evaluation, timeout performed and anesthesia consent Lidocaine 1% used for infiltration and patient sedated Hand hygiene performed  and maximum sterile barriers used  Catheter size: 8.5 Fr Total catheter length 10. Sheath introducer Procedure performed using ultrasound guided technique. Ultrasound Notes:anatomy identified, needle tip was noted to be adjacent to the nerve/plexus identified, no ultrasound evidence of intravascular and/or intraneural injection and image(s) printed for medical record Attempts: 1 Following insertion, line sutured, dressing applied and Biopatch. Post procedure assessment: blood return through all ports, free fluid flow and no air  Patient tolerated the procedure well with no immediate complications.

## 2019-06-30 NOTE — Anesthesia Procedure Notes (Signed)
Arterial Line Insertion Start/End6/22/2021 7:05 AM, 06/30/2019 7:10 AM Performed by: Colin Benton, CRNA, CRNA  Patient location: Pre-op. Preanesthetic checklist: patient identified, IV checked, site marked, risks and benefits discussed, surgical consent, monitors and equipment checked, pre-op evaluation, timeout performed and anesthesia consent Lidocaine 1% used for infiltration Left, radial was placed Catheter size: 20 G Hand hygiene performed , maximum sterile barriers used  and Seldinger technique used Allen's test indicative of satisfactory collateral circulation Attempts: 1 Procedure performed without using ultrasound guided technique. Following insertion, dressing applied and Biopatch. Post procedure assessment: normal and unchanged  Patient tolerated the procedure well with no immediate complications.

## 2019-06-30 NOTE — Anesthesia Procedure Notes (Signed)
Procedure Name: Intubation Date/Time: 06/30/2019 8:25 AM Performed by: Colin Benton, CRNA Pre-anesthesia Checklist: Patient identified, Emergency Drugs available, Suction available and Patient being monitored Patient Re-evaluated:Patient Re-evaluated prior to induction Oxygen Delivery Method: Circle system utilized Preoxygenation: Pre-oxygenation with 100% oxygen Induction Type: IV induction Ventilation: Oral airway inserted - appropriate to patient size and Mask ventilation without difficulty Laryngoscope Size: Miller and 2 Grade View: Grade I Tube type: Oral Tube size: 7.5 mm Number of attempts: 1 Airway Equipment and Method: Stylet Placement Confirmation: ETT inserted through vocal cords under direct vision,  positive ETCO2 and breath sounds checked- equal and bilateral Secured at: 23 cm Tube secured with: Tape Dental Injury: Teeth and Oropharynx as per pre-operative assessment

## 2019-06-30 NOTE — OR Nursing (Signed)
Portable Chest X-Ray performed. Results reviewed by Dr. Prescott Gum.

## 2019-06-30 NOTE — Procedures (Signed)
Extubation Procedure Note  Patient Details:   Name: Phillip [name] DOB: December 22, 1950 MRN: [id number]   Airway Documentation:    Vent end date: 06/30/19 Vent end time: 1815   Evaluation  O2 sats: stable throughout Complications: No apparent complications Patient did tolerate procedure well.     Yes   Pt extubated per rapid heart wean protocol. NIF -25 and VC 1,041mL. No apparent complications, cuff leak heard prior to extubation. Pt is alert and able to follow commands.   Esperanza Sheets T 06/30/2019, 6:32 PM

## 2019-07-01 ENCOUNTER — Inpatient Hospital Stay (HOSPITAL_COMMUNITY): Payer: Medicare Other

## 2019-07-01 ENCOUNTER — Encounter (HOSPITAL_COMMUNITY): Payer: Self-pay | Admitting: Cardiothoracic Surgery

## 2019-07-01 LAB — CBC
HCT: 25.1 % — ABNORMAL LOW (ref 39.0–52.0)
Hemoglobin: 9 g/dL — ABNORMAL LOW (ref 13.0–17.0)
MCH: 33.2 pg (ref 26.0–34.0)
MCHC: 35.9 g/dL (ref 30.0–36.0)
MCV: 92.6 fL (ref 80.0–100.0)
Platelets: 159 10*3/uL (ref 150–400)
RBC: 2.71 MIL/uL — ABNORMAL LOW (ref 4.22–5.81)
RDW: 12.5 % (ref 11.5–15.5)
WBC: 11.3 10*3/uL — ABNORMAL HIGH (ref 4.0–10.5)
nRBC: 0 % (ref 0.0–0.2)

## 2019-07-01 LAB — CBC WITH DIFFERENTIAL/PLATELET
Abs Immature Granulocytes: 0.05 10*3/uL (ref 0.00–0.07)
Basophils Absolute: 0 10*3/uL (ref 0.0–0.1)
Basophils Relative: 0 %
Eosinophils Absolute: 0 10*3/uL (ref 0.0–0.5)
Eosinophils Relative: 0 %
HCT: 24.8 % — ABNORMAL LOW (ref 39.0–52.0)
Hemoglobin: 8.5 g/dL — ABNORMAL LOW (ref 13.0–17.0)
Immature Granulocytes: 1 %
Lymphocytes Relative: 10 %
Lymphs Abs: 0.9 10*3/uL (ref 0.7–4.0)
MCH: 32.4 pg (ref 26.0–34.0)
MCHC: 34.3 g/dL (ref 30.0–36.0)
MCV: 94.7 fL (ref 80.0–100.0)
Monocytes Absolute: 1 10*3/uL (ref 0.1–1.0)
Monocytes Relative: 11 %
Neutro Abs: 7 10*3/uL (ref 1.7–7.7)
Neutrophils Relative %: 78 %
Platelets: 115 10*3/uL — ABNORMAL LOW (ref 150–400)
RBC: 2.62 MIL/uL — ABNORMAL LOW (ref 4.22–5.81)
RDW: 12.9 % (ref 11.5–15.5)
WBC: 9 10*3/uL (ref 4.0–10.5)
nRBC: 0 % (ref 0.0–0.2)

## 2019-07-01 LAB — PREPARE CRYOPRECIPITATE
Unit division: 0
Unit division: 0

## 2019-07-01 LAB — GLUCOSE, CAPILLARY
Glucose-Capillary: 110 mg/dL — ABNORMAL HIGH (ref 70–99)
Glucose-Capillary: 129 mg/dL — ABNORMAL HIGH (ref 70–99)
Glucose-Capillary: 134 mg/dL — ABNORMAL HIGH (ref 70–99)
Glucose-Capillary: 138 mg/dL — ABNORMAL HIGH (ref 70–99)
Glucose-Capillary: 140 mg/dL — ABNORMAL HIGH (ref 70–99)
Glucose-Capillary: 153 mg/dL — ABNORMAL HIGH (ref 70–99)
Glucose-Capillary: 157 mg/dL — ABNORMAL HIGH (ref 70–99)
Glucose-Capillary: 157 mg/dL — ABNORMAL HIGH (ref 70–99)
Glucose-Capillary: 163 mg/dL — ABNORMAL HIGH (ref 70–99)
Glucose-Capillary: 168 mg/dL — ABNORMAL HIGH (ref 70–99)
Glucose-Capillary: 171 mg/dL — ABNORMAL HIGH (ref 70–99)
Glucose-Capillary: 182 mg/dL — ABNORMAL HIGH (ref 70–99)
Glucose-Capillary: 197 mg/dL — ABNORMAL HIGH (ref 70–99)

## 2019-07-01 LAB — COOXEMETRY PANEL
Carboxyhemoglobin: 1.9 % — ABNORMAL HIGH (ref 0.5–1.5)
Methemoglobin: 1.2 % (ref 0.0–1.5)
O2 Saturation: 67.5 %
Total hemoglobin: 8.8 g/dL — ABNORMAL LOW (ref 12.0–16.0)

## 2019-07-01 LAB — BASIC METABOLIC PANEL
Anion gap: 9 (ref 5–15)
Anion gap: 8 (ref 5–15)
BUN: 6 mg/dL — ABNORMAL LOW (ref 8–23)
BUN: 7 mg/dL — ABNORMAL LOW (ref 8–23)
CO2: 22 mmol/L (ref 22–32)
CO2: 25 mmol/L (ref 22–32)
Calcium: 8.2 mg/dL — ABNORMAL LOW (ref 8.9–10.3)
Calcium: 8.5 mg/dL — ABNORMAL LOW (ref 8.9–10.3)
Chloride: 108 mmol/L (ref 98–111)
Chloride: 104 mmol/L (ref 98–111)
Creatinine, Ser: 0.61 mg/dL (ref 0.61–1.24)
Creatinine, Ser: 0.71 mg/dL (ref 0.61–1.24)
GFR calc Af Amer: >60 mL/min (ref >60)
GFR calc Af Amer: >60 mL/min (ref >60)
GFR calc non Af Amer: >60 mL/min (ref >60)
GFR calc non Af Amer: >60 mL/min (ref >60)
Glucose, Bld: 138 mg/dL — ABNORMAL HIGH (ref 70–99)
Glucose, Bld: 209 mg/dL — ABNORMAL HIGH (ref 70–99)
Potassium: 4 mmol/L (ref 3.5–5.1)
Potassium: 4.1 mmol/L (ref 3.5–5.1)
Sodium: 139 mmol/L (ref 135–145)
Sodium: 137 mmol/L (ref 135–145)

## 2019-07-01 LAB — POCT I-STAT 7, (LYTES, BLD GAS, ICA,H+H)
Acid-Base Excess: 0 mmol/L (ref 0.0–2.0)
Bicarbonate: 25 mmol/L (ref 20.0–28.0)
Calcium, Ion: 1.22 mmol/L (ref 1.15–1.40)
HCT: 27 % — ABNORMAL LOW (ref 39.0–52.0)
Hemoglobin: 9.2 g/dL — ABNORMAL LOW (ref 13.0–17.0)
O2 Saturation: 98 %
Patient temperature: 38
Potassium: 3.9 mmol/L (ref 3.5–5.1)
Sodium: 139 mmol/L (ref 135–145)
TCO2: 26 mmol/L (ref 22–32)
pCO2 arterial: 41.7 mmHg (ref 32.0–48.0)
pH, Arterial: 7.39 (ref 7.350–7.450)
pO2, Arterial: 119 mmHg — ABNORMAL HIGH (ref 83.0–108.0)

## 2019-07-01 LAB — PREPARE FRESH FROZEN PLASMA: Unit division: 0

## 2019-07-01 LAB — BPAM CRYOPRECIPITATE
Blood Product Expiration Date: 202106222100
Blood Product Expiration Date: 202106222100
ISSUE DATE / TIME: 202106221525
ISSUE DATE / TIME: 202106221525
Unit Type and Rh: 5100
Unit Type and Rh: 5100

## 2019-07-01 LAB — BPAM FFP
Blood Product Expiration Date: 202106252359
Blood Product Expiration Date: 202106252359
ISSUE DATE / TIME: 202106221228
ISSUE DATE / TIME: 202106221228
Unit Type and Rh: 5100
Unit Type and Rh: 5100

## 2019-07-01 LAB — PREPARE PLATELET PHERESIS: Unit division: 0

## 2019-07-01 LAB — BPAM PLATELET PHERESIS
Blood Product Expiration Date: 202106232359
ISSUE DATE / TIME: 202106221248
Unit Type and Rh: 5100

## 2019-07-01 LAB — MAGNESIUM
Magnesium: 2.1 mg/dL (ref 1.7–2.4)
Magnesium: 2.1 mg/dL (ref 1.7–2.4)

## 2019-07-01 MED ORDER — FUROSEMIDE 10 MG/ML IJ SOLN
20.0000 mg | Freq: Two times a day (BID) | INTRAMUSCULAR | Status: DC
  Administered 2019-07-01 – 2019-07-02 (×4): 20 mg via INTRAVENOUS
  Filled 2019-07-01 (×5): qty 2

## 2019-07-01 MED ORDER — MIDAZOLAM HCL 2 MG/2ML IJ SOLN: 2.0000 mg | INTRAMUSCULAR | Status: DC | PRN

## 2019-07-01 MED ORDER — SODIUM CHLORIDE 0.9% FLUSH
10.0000 mL | INTRAVENOUS | Status: DC | PRN
  Administered 2019-07-02: 10 mL

## 2019-07-01 MED ORDER — INSULIN ASPART 100 UNIT/ML ~~LOC~~ SOLN
0.0000 [IU] | SUBCUTANEOUS | Status: DC
  Administered 2019-07-01: 4 [IU] via SUBCUTANEOUS
  Administered 2019-07-01: 2 [IU] via SUBCUTANEOUS
  Administered 2019-07-01 – 2019-07-02 (×4): 4 [IU] via SUBCUTANEOUS

## 2019-07-01 MED ORDER — METOLAZONE 5 MG PO TABS
5.0000 mg | ORAL_TABLET | Freq: Every day | ORAL | Status: AC
  Administered 2019-07-01 – 2019-07-03 (×3): 5 mg via ORAL
  Filled 2019-07-01 (×3): qty 1

## 2019-07-01 MED ORDER — POTASSIUM CHLORIDE 10 MEQ/50ML IV SOLN
10.0000 meq | INTRAVENOUS | Status: AC
  Administered 2019-07-01 (×3): 10 meq via INTRAVENOUS
  Filled 2019-07-01 (×3): qty 50

## 2019-07-01 MED ORDER — POTASSIUM CHLORIDE 10 MEQ/50ML IV SOLN
10.0000 meq | INTRAVENOUS | Status: AC
  Administered 2019-07-01 (×2): 10 meq via INTRAVENOUS
  Filled 2019-07-01 (×2): qty 50

## 2019-07-01 MED ORDER — SODIUM CHLORIDE 0.9% FLUSH
10.0000 mL | Freq: Two times a day (BID) | INTRAVENOUS | Status: DC
  Administered 2019-07-01: 20 mL
  Administered 2019-07-02: 10 mL
  Administered 2019-07-02: 30 mL
  Administered 2019-07-03 – 2019-07-05 (×2): 10 mL

## 2019-07-01 MED ORDER — INSULIN DETEMIR 100 UNIT/ML ~~LOC~~ SOLN
20.0000 [IU] | Freq: Two times a day (BID) | SUBCUTANEOUS | Status: DC
  Administered 2019-07-01 (×2): 20 [IU] via SUBCUTANEOUS
  Filled 2019-07-01 (×4): qty 0.2

## 2019-07-01 MED FILL — Magnesium Sulfate Inj 50%: INTRAMUSCULAR | Qty: 10 | Status: AC

## 2019-07-01 MED FILL — Heparin Sodium (Porcine) Inj 1000 Unit/ML: INTRAMUSCULAR | Qty: 30 | Status: AC

## 2019-07-01 MED FILL — Potassium Chloride Inj 2 mEq/ML: INTRAVENOUS | Qty: 40 | Status: AC

## 2019-07-01 NOTE — Op Note (Signed)
NAMEJARRAH, [name] MEDICAL RECORD [id number] ACCOUNT [id number] DATE OF BIRTH:10-13-1950 FACILITY: [facility] LOCATION: [facility] PHYSICIAN:Lundon Rosier VAN TRIGT III, MD  OPERATIVE REPORT  DATE OF PROCEDURE:  06/30/2019  OPERATION: 1.  Coronary artery bypass grafting x4 (left internal mammary artery to diagonal, saphenous vein graft to ramus intermediate, saphenous vein graft to obtuse marginal 1, saphenous vein graft to posterior descending). 2.  Bilateral leg endoscopic harvest of greater saphenous vein.  SURGEON:  Ivin Poot, MD  ASSISTANT:  Lars Pinks, PA-C.  ANESTHESIA:  General by Dr. Lendon Ka.  PREOPERATIVE DIAGNOSES:   1.  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   2.  Diabetes mellitus. 3.  Obesity, elevated body mass index.  POSTOPERATIVE DIAGNOSES:   1.  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   2.  Diabetes mellitus. 3.  Obesity, elevated body mass index.  CLINICAL NOTE:  The patient is a 69 year old gentleman with risk factors for coronary disease who had previously undergone cardiac catheterization at an outside hospital, following which he was recommended for heart bypass surgery.  He did not wish to  pursue that recommendation at that time and presented for evaluation in our hospital system.  He was seen by cardiology, who also recommended multivessel bypass surgery and I saw the patient in consultation.  I reviewed his coronary arteriogram and  echocardiogram performed at the outside hospital and discussed the results with the patient.  He had a chronically occluded LAD with poor collateralization distally, moderate disease of the right coronary artery and high-grade proximal stenosis of a  large ramus and a first OM.  The distal circumflex was atretic and chronically occluded.  His EF was 50% and he had only mild valvular disease.  I recommended multivessel bypass grafting for  best control of his symptoms and improved long-term survival.   I discussed the procedure in detail, including the location of the surgical incisions, the use of general anesthesia and cardiopulmonary bypass, and the expected postoperative hospital recovery.  I discussed with the patient the risks to him of surgery,  including a risk of stroke, bleeding, blood transfusion, postoperative pulmonary problems including pleural effusion, postoperative infection, postoperative organ failure, and death.  He demonstrated his understanding and agreed to proceed with surgery  under what I felt was an informed consent.  OBSERVATIONS:  1.   Coagulopathy with significant bleeding from the chest wall, sternal, and abundant mediastinal fat. 2.  Poor quality vein below the knee requiring bilateral vein harvest.   3.  Heavily calcified vessel of the entire LAD, which made grafting not possible even with attempted endarterectomy.  4.  Severe calcification of the right coronary artery including distally with a graft placed to a small branch off the distal RCA.  DESCRIPTION OF PROCEDURE:  The patient was brought from preop holding where informed consent was documented and final issues were addressed with the patient.  He was placed supine on the operating table and general anesthesia was induced.  He remained  stable.  A transesophageal echo probe was placed by the anesthesiologist, which confirmed the preoperative echo findings.  The patient was prepped and draped as a sterile field.  A proper time-out was performed.  A sternal incision was made as the  saphenous vein was harvested endoscopically, first from the right leg and then from the left leg.  The left internal mammary artery was harvested as a pedicle graft from its origin at the subclavian vessels.  It  was a 1.5 mm vessel with excellent flow.  The sternal retractor was placed using the deep blades because of the patient's short, obese body habitus.  The  pericardium was opened after dissecting through a thick layer of mediastinal fat.  The pericardium was opened and suspended.  When the vein  was harvested, the patient was heparinized and pursestrings were placed in the ascending aorta and right atrium and he was placed on cardiopulmonary bypass.  The coronaries were identified for grafting.  The ramus and OM1 were intramyocardial, but  adequate targets.  The right coronary was heavily calcified and there was a small target in the posterior descending distribution as the only option.  The LAD was heavily calcified and further dissection under crossclamp was planned.  The patient had cardioplegia cannulas placed for both antegrade aortic and retrograde coronary sinus catheters.  The patient was cooled to 32 degrees.  The crossclamp was applied.  A liter of cold blood cardioplegia was delivered in split doses between  the antegrade aortic and retrograde coronary sinus catheters.  There was good cardioplegic arrest and supple temperature dropped less than 12 degrees.  Cardioplegia was delivered every 20 minutes while the crossclamp was in place.  The distal coronary anastomoses were performed.  The first distal anastomosis was to the posterior descending branch off the distal heavily calcified RCA.  This was a 1.2 mm vessel.  A reverse saphenous vein was sewn end-to-side with running 7-0 Prolene  with adequate flow through the graft.  The second distal anastomosis was the OM branch of the left coronary.  This had a proximal 80% stenosis.  A reverse saphenous vein was sewn end-to-side with running 7-0 Prolene with good flow through the graft.  Cardioplegia was redosed.  The third distal anastomosis was the ramus branch of the left coronary.  This had a proximal 80% stenosis.  A reverse saphenous vein was sewn end-to-side with running 7-0 Prolene with good flow through the graft.  Cardioplegia was redosed.  The fourth distal anastomosis was planned to the  LAD.  However, further dissection of the LAD determined that there was no adequate lumen and it was heavily calcified.  Dissection of the LAD to remove the plaque was attempted, but this was found to be  without reasonable chance of success because of lack of adequate adventitia and lack of adequate lumen to maintain graft patency.  For that reason, the diagonal branch was dissected.  This was heavily calcified proximally, but was an adequate softer  target distally, although small at 1.4 mm.  The left IMA pedicle was brought through an opening in the left lateral pericardium, was brought down onto the diagonal branch and sewn end-to-side with running 8-0 Prolene.  The bulldog was temporarily removed  and there was good flow through the anastomosis, which was then secured to the epicardium with 6-0 Prolenes as the pedicle clamp was reapplied.  While the crossclamp was still in place and after the patient received an additional dose of cardioplegia, 3 proximal vein anastomoses were performed in the ascending aorta using a 4.5 mm punch and running 6-0 Prolene.  The aorta was mildly calcified and  also fairly thin with a thick layer of adventitial fat.  Prior to removing the crossclamp, air was vented from the coronaries with a dose of retrograde warm blood cardioplegia and the crossclamp was removed.  The vein grafts were de-aired and opened.  Each had good flow.  Hemostasis was obtained at the proximal and distal  anastomoses and documented with good flow.  Temporary pacing wires were applied as the patient was rewarmed and reperfused.  Lungs were  reexpanded and the ventilator was resumed.  The patient was then weaned off cardiopulmonary bypass without difficulty.  Hemodynamics were adequate with LV function preserved on echo with EF showing 50-55%.  Protamine was administered without adverse  reaction.  The cannulas were removed.  The patient had diffuse bleeding from the chest wall and mediastinal fat.  A  second small dose of protamine was administered without improvement and the patient was then subsequently given some FFP and component  therapy from the blood bank for improvement of coagulopathy.  Drains had been placed in both pleural spaces and in the anterior mediastinum and posterior mediastinum.  Finally, when diffuse coagulopathy was showing improvement, the sternum was closed  with interrupted steel wire.  The patient remained stable.  The pectoralis fascia was closed with a running #1 Vicryl.  The subcutaneous and skin layers were closed with running Vicryl and sterile dressings were applied.  Total cardiopulmonary bypass  time was 185 minutes.  VN/NUANCE  D:07/01/2019 T:07/01/2019 JOB:[id number]

## 2019-07-01 NOTE — Progress Notes (Addendum)
[facility] DAILY ICU PROGRESS NOTE                   [address]            [place] [address]          [phone]   1 Day Post-Op Procedure(s) (LRB): CORONARY ARTERY BYPASS GRAFTING (CABG) x 4, LIMA TO DIAGONAL 1, SVG TO OML, SVG TO RAMUS, SVG TO PDA (N/A) TRANSESOPHAGEAL ECHOCARDIOGRAM (TEE) (N/A) ENDOVEIN HARVEST OF GREATER SAPHENOUS VEIN (Bilateral)  Total Length of Stay:  LOS: 1 day   Subjective: Feels good this morning. Pain is well controlled .   Objective: Vital signs in last 24 hours: Temp:  [97.9 F (36.6 C)-100.8 F (38.2 C)] 99.5 F (37.5 C) (06/23 0745) Pulse Rate:  [90-113] 101 (06/23 0745) Cardiac Rhythm: Sinus tachycardia (06/23 0400) Resp:  [12-31] 20 (06/23 0745) BP: (89-116)/(47-76) 116/76 (06/23 0700) SpO2:  [92 %-100 %] 93 % (06/23 0745) Arterial Line BP: (99-138)/(44-65) 111/50 (06/23 0745) FiO2 (%):  [40 %-50 %] 40 % (06/22 1754) Weight:  [104.8 kg] 104.8 kg (06/23 0500)  Filed Weights   06/30/19 0618 07/01/19 0500  Weight: 97.1 kg 104.8 kg    Weight change: 7.7 kg   Hemodynamic parameters for last 24 hours: PAP: (21-57)/(0-39) 42/39 CO:  [4.2 L/min-7.7 L/min] 6.6 L/min CI:  [2 L/min/m2-3.7 L/min/m2] 3.2 L/min/m2  Intake/Output from previous day: 06/22 0701 - 06/23 0700 In: 7449.1 [I.V.:3726.7; Blood:1711; IV Piggyback:2011.5] Out: 5668 [XBLTJ:0300; Blood:1013; Chest Tube:680]  Intake/Output this shift: No intake/output data recorded.  Current Meds: Scheduled Meds: . sodium chloride   Intravenous Once  . acetaminophen  1,000 mg Oral Q6H   Or  . acetaminophen (TYLENOL) oral liquid 160 mg/5 mL  1,000 mg Per Tube Q6H  . aspirin EC  325 mg Oral Daily   Or  . aspirin  324 mg Per Tube Daily  . bisacodyl  10 mg Oral Daily   Or  . bisacodyl  10 mg Rectal Daily  . Chlorhexidine Gluconate Cloth  6 each Topical Daily  . docusate sodium  200 mg Oral Daily  . furosemide  20 mg Intravenous BID  . insulin aspart  0-24 Units  Subcutaneous Q4H  . insulin detemir  20 Units Subcutaneous BID  . metoCLOPramide (REGLAN) injection  10 mg Intravenous Q6H  . metoprolol tartrate  12.5 mg Oral BID   Or  . metoprolol tartrate  12.5 mg Per Tube BID  . multivitamin with minerals  1 tablet Oral Daily  . [START ON 07/02/2019] pantoprazole  40 mg Oral Daily  . rosuvastatin  40 mg Oral QHS  . sodium chloride flush  10-40 mL Intracatheter Q12H  . sodium chloride flush  3 mL Intravenous Q12H   Continuous Infusions: . sodium chloride 20 mL/hr at 07/01/19 0700  . sodium chloride    . sodium chloride 10 mL/hr at 06/30/19 1640  . albumin human 12.5 g (06/30/19 2012)  . cefUROXime (ZINACEF)  IV Stopped (07/01/19 0617)  . dexmedetomidine (PRECEDEX) IV infusion Stopped (06/30/19 1729)  . insulin 4.4 mL/hr at 07/01/19 0700  . lactated ringers 20 mL/hr at 07/01/19 0700  . lactated ringers 20 mL/hr at 07/01/19 0700  . milrinone 0.25 mcg/kg/min (07/01/19 0700)  . norepinephrine (LEVOPHED) Adult infusion 6 mcg/min (07/01/19 0700)  . potassium chloride    . vancomycin Stopped (06/30/19 2333)   PRN Meds:.sodium chloride, albumin human, dextrose, metoprolol tartrate, midazolam, morphine injection, ondansetron (ZOFRAN) IV, oxyCODONE,  sodium chloride flush, sodium chloride flush, traMADol  General appearance: alert, cooperative and no distress Heart: regular rate and rhythm, S1, S2 normal, no murmur, click, rub or gallop Lungs: clear to auscultation bilaterally, diminished in the lower lobes Abdomen: soft, non-tender; bowel sounds normal; no masses,  no organomegaly Extremities: 1+ non-pitting edema Wound: c/d/i with sterile dressing in place.   Lab Results: CBC: Recent Labs    06/30/19 2153 06/30/19 2153 07/01/19 0400 07/01/19 0409  WBC 11.0*  --  11.3*  --   HGB 8.6*   < > 9.0* 9.2*  HCT 24.3*   < > 25.1* 27.0*  PLT 142*  --  159  --    < > = values in this interval not displayed.   BMET:  Recent Labs    06/30/19 2153  06/30/19 2153 07/01/19 0400 07/01/19 0409  NA 136   < > 139 139  K 3.6   < > 4.0 3.9  CL 107  --  108  --   CO2 23  --  22  --   GLUCOSE 155*  --  138*  --   BUN 7*  --  6*  --   CREATININE 0.65  --  0.61  --   CALCIUM 7.9*  --  8.2*  --    < > = values in this interval not displayed.    CMET: Lab Results  Component Value Date   WBC 11.3 (H) 07/01/2019   HGB 9.2 (L) 07/01/2019   HCT 27.0 (L) 07/01/2019   PLT 159 07/01/2019   GLUCOSE 138 (H) 07/01/2019   ALT 56 (H) 06/26/2019   AST 32 06/26/2019   NA 139 07/01/2019   K 3.9 07/01/2019   CL 108 07/01/2019   CREATININE 0.61 07/01/2019   BUN 6 (L) 07/01/2019   CO2 22 07/01/2019   INR 1.3 (H) 06/30/2019   HGBA1C 8.0 (H) 06/26/2019      PT/INR:  Recent Labs    06/30/19 1642  LABPROT 16.0*  INR 1.3*   Radiology: DG Chest Portable 1 View  Result Date: 06/30/2019 CLINICAL DATA:  69 year old male status post CABG. Rule out pneumothorax. EXAM: PORTABLE CHEST 1 VIEW COMPARISON:  Chest radiograph dated 06/26/2019 FINDINGS: Endotracheal tube approximately 6 cm above the carina and enteric tube extends below the diaphragm with tip in the proximal stomach. Right IJ Swan-Ganz catheter with tip likely at the bifurcation of the main pulmonary trunk. Bilateral chest tubes noted. Left lung base density may represent atelectasis. Trace left pleural effusion may be present. No pneumothorax. Mildly enlarged cardiomediastinal silhouette. Atherosclerotic calcification of the aorta. Median sternotomy wires. IMPRESSION: 1. Status post CABG. No pneumothorax. 2. Left lung base atelectasis. 3. Support lines and tubes as described. Electronically Signed   By: Anner Crete M.D.   On: 06/30/2019 16:35     Assessment/Plan: S/P Procedure(s) (LRB): CORONARY ARTERY BYPASS GRAFTING (CABG) x 4, LIMA TO DIAGONAL 1, SVG TO OML, SVG TO RAMUS, SVG TO PDA (N/A) TRANSESOPHAGEAL ECHOCARDIOGRAM (TEE) (N/A) ENDOVEIN HARVEST OF GREATER SAPHENOUS VEIN  (Bilateral)  1. CV-ST in the low 100s. BP well controlled. Asa, remains on milrinone 0.125, remains on some Norepi. coox 67.5 2. Pulm-tolerating 3L [place], CXR stable. Chest tubes still putting out too much drainage to dc. 3. Renal-creatinine 0.61, weight up 7kg. Will wait until off Norepi before diuresing 4. H and H  9.2/27.0, expected acute blood loss anemia 5. Endo- Blood glucose well controlled.  Plan: See progression orders. Discontinue swan-ganz catheter  and arterial line once pressors off. Discontinue mediastinal chest tubes. OOB to chair. Continue incentive spirometer.     Phillip [name] 07/01/2019 7:56 AM   Progressing well after multivessel CABG Will mobilize to chair and check chest tube output later today for possible removal of right pleural and posterior mediastinal tubes  patient examined and medical record reviewed,agree with above note. Phillip [name] 07/01/2019

## 2019-07-01 NOTE — Progress Notes (Signed)
EVENING ROUNDS NOTE :     [address]       [place] [address]             [phone]                 1 Day Post-Op Procedure(s) (LRB): CORONARY ARTERY BYPASS GRAFTING (CABG) x 4, LIMA TO DIAGONAL 1, SVG TO OML, SVG TO RAMUS, SVG TO PDA (N/A) TRANSESOPHAGEAL ECHOCARDIOGRAM (TEE) (N/A) ENDOVEIN HARVEST OF GREATER SAPHENOUS VEIN (Bilateral)   Total Length of Stay:  LOS: 1 day  Events:  No events    BP 105/71 (BP Location: Right Arm)   Pulse 96   Temp 98.2 F (36.8 C) (Oral)   Resp 15   Ht 5\' 7"  (1.702 m)   Wt 104.8 kg   SpO2 93%   BMI 36.19 kg/m   PAP: (21-47)/(6-43) 47/43 CO:  [6.6 L/min-7.7 L/min] 6.6 L/min CI:  [3.2 L/min/m2-3.7 L/min/m2] 3.2 L/min/m2     . sodium chloride Stopped (07/01/19 0843)  . sodium chloride    . sodium chloride 10 mL/hr at 06/30/19 1640  . cefUROXime (ZINACEF)  IV 1.5 g (07/01/19 1603)  . lactated ringers Stopped (07/01/19 0902)  . lactated ringers Stopped (07/01/19 0843)  . milrinone 0.125 mcg/kg/min (07/01/19 1847)  . norepinephrine (LEVOPHED) Adult infusion Stopped (07/01/19 0847)    I/O last 3 completed shifts: In: 7927.2 [I.V.:3904.9; Blood:1711; IV Piggyback:2311.3] Out: 9628 [Urine:4915; Blood:1013; Chest Tube:960]   CBC Latest Ref Rng & Units 07/01/2019 07/01/2019 07/01/2019  WBC 4.0 - 10.5 K/uL 9.0 - 11.3(H)  Hemoglobin 13.0 - 17.0 g/dL 8.5(L) 9.2(L) 9.0(L)  Hematocrit 39 - 52 % 24.8(L) 27.0(L) 25.1(L)  Platelets 150 - 400 K/uL 115(L) - 159    BMP Latest Ref Rng & Units 07/01/2019 07/01/2019 07/01/2019  Glucose 70 - 99 mg/dL 209(H) - 138(H)  BUN 8 - 23 mg/dL 7(L) - 6(L)  Creatinine 0.61 - 1.24 mg/dL 0.71 - 0.61  Sodium 135 - 145 mmol/L 137 139 139  Potassium 3.5 - 5.1 mmol/L 4.1 3.9 4.0  Chloride 98 - 111 mmol/L 104 - 108  CO2 22 - 32 mmol/L 25 - 22  Calcium 8.9 - 10.3 mg/dL 8.5(L) - 8.2(L)    ABG    Component Value Date/Time   PHART 7.390 07/01/2019 0409   PCO2ART 41.7 07/01/2019 0409   PO2ART  119 (H) 07/01/2019 0409   HCO3 25.0 07/01/2019 0409   TCO2 26 07/01/2019 0409   ACIDBASEDEF 2.0 06/30/2019 1939   O2SAT 67.5 07/01/2019 0417       Melodie Bouillon, MD 07/01/2019 7:10 PM

## 2019-07-02 ENCOUNTER — Inpatient Hospital Stay (HOSPITAL_COMMUNITY): Payer: Medicare Other

## 2019-07-02 LAB — CBC
HCT: 24 % — ABNORMAL LOW (ref 39.0–52.0)
Hemoglobin: 8.2 g/dL — ABNORMAL LOW (ref 13.0–17.0)
MCH: 32.8 pg (ref 26.0–34.0)
MCHC: 34.2 g/dL (ref 30.0–36.0)
MCV: 96 fL (ref 80.0–100.0)
Platelets: 109 10*3/uL — ABNORMAL LOW (ref 150–400)
RBC: 2.5 MIL/uL — ABNORMAL LOW (ref 4.22–5.81)
RDW: 12.9 % (ref 11.5–15.5)
WBC: 9 10*3/uL (ref 4.0–10.5)
nRBC: 0 % (ref 0.0–0.2)

## 2019-07-02 LAB — GLUCOSE, CAPILLARY
Glucose-Capillary: 174 mg/dL — ABNORMAL HIGH (ref 70–99)
Glucose-Capillary: 188 mg/dL — ABNORMAL HIGH (ref 70–99)
Glucose-Capillary: 219 mg/dL — ABNORMAL HIGH (ref 70–99)
Glucose-Capillary: 203 mg/dL — ABNORMAL HIGH (ref 70–99)
Glucose-Capillary: 156 mg/dL — ABNORMAL HIGH (ref 70–99)
Glucose-Capillary: 157 mg/dL — ABNORMAL HIGH (ref 70–99)
Glucose-Capillary: 128 mg/dL — ABNORMAL HIGH (ref 70–99)

## 2019-07-02 LAB — BASIC METABOLIC PANEL
Anion gap: 8 (ref 5–15)
BUN: 8 mg/dL (ref 8–23)
CO2: 26 mmol/L (ref 22–32)
Calcium: 8.5 mg/dL — ABNORMAL LOW (ref 8.9–10.3)
Chloride: 100 mmol/L (ref 98–111)
Creatinine, Ser: 0.74 mg/dL (ref 0.61–1.24)
GFR calc Af Amer: >60 mL/min (ref >60)
GFR calc non Af Amer: >60 mL/min (ref >60)
Glucose, Bld: 188 mg/dL — ABNORMAL HIGH (ref 70–99)
Potassium: 3.6 mmol/L (ref 3.5–5.1)
Sodium: 134 mmol/L — ABNORMAL LOW (ref 135–145)

## 2019-07-02 LAB — COOXEMETRY PANEL
Carboxyhemoglobin: 1.8 % — ABNORMAL HIGH (ref 0.5–1.5)
Methemoglobin: 1.4 % (ref 0.0–1.5)
O2 Saturation: 55.2 %
Total hemoglobin: 8.3 g/dL — ABNORMAL LOW (ref 12.0–16.0)

## 2019-07-02 MED ORDER — MILRINONE LACTATE IN DEXTROSE 20-5 MG/100ML-% IV SOLN
0.1250 ug/kg/min | INTRAVENOUS | Status: DC
  Administered 2019-07-02 – 2019-07-04 (×3): 0.125 ug/kg/min via INTRAVENOUS
  Filled 2019-07-02 (×3): qty 100

## 2019-07-02 MED ORDER — CARVEDILOL 6.25 MG PO TABS
6.2500 mg | ORAL_TABLET | Freq: Two times a day (BID) | ORAL | Status: DC
  Administered 2019-07-02 – 2019-07-03 (×2): 6.25 mg via ORAL
  Filled 2019-07-02 (×2): qty 1

## 2019-07-02 MED ORDER — POTASSIUM CHLORIDE CRYS ER 20 MEQ PO TBCR
20.0000 meq | EXTENDED_RELEASE_TABLET | Freq: Two times a day (BID) | ORAL | Status: DC
  Filled 2019-07-02: qty 1

## 2019-07-02 MED ORDER — POTASSIUM CHLORIDE 10 MEQ/50ML IV SOLN
10.0000 meq | INTRAVENOUS | Status: AC
  Administered 2019-07-02 (×2): 10 meq via INTRAVENOUS
  Filled 2019-07-02 (×2): qty 50

## 2019-07-02 MED ORDER — FE FUMARATE-B12-VIT C-FA-IFC PO CAPS
1.0000 | ORAL_CAPSULE | Freq: Three times a day (TID) | ORAL | Status: DC
  Administered 2019-07-02 – 2019-07-06 (×12): 1 via ORAL
  Filled 2019-07-02 (×13): qty 1

## 2019-07-02 MED ORDER — METOPROLOL TARTRATE 25 MG PO TABS
25.0000 mg | ORAL_TABLET | Freq: Two times a day (BID) | ORAL | Status: DC
  Administered 2019-07-02: 25 mg via ORAL
  Filled 2019-07-02: qty 1

## 2019-07-02 MED ORDER — INSULIN ASPART 100 UNIT/ML ~~LOC~~ SOLN
0.0000 [IU] | Freq: Three times a day (TID) | SUBCUTANEOUS | Status: DC
  Administered 2019-07-02: 2 [IU] via SUBCUTANEOUS
  Administered 2019-07-02: 8 [IU] via SUBCUTANEOUS
  Administered 2019-07-02: 2 [IU] via SUBCUTANEOUS
  Administered 2019-07-03: 4 [IU] via SUBCUTANEOUS
  Administered 2019-07-03: 2 [IU] via SUBCUTANEOUS
  Administered 2019-07-03 – 2019-07-04 (×2): 4 [IU] via SUBCUTANEOUS
  Administered 2019-07-04 (×2): 2 [IU] via SUBCUTANEOUS
  Administered 2019-07-04: 4 [IU] via SUBCUTANEOUS
  Administered 2019-07-05: 2 [IU] via SUBCUTANEOUS
  Administered 2019-07-05: 4 [IU] via SUBCUTANEOUS
  Administered 2019-07-06: 2 [IU] via SUBCUTANEOUS

## 2019-07-02 MED ORDER — POTASSIUM CHLORIDE CRYS ER 20 MEQ PO TBCR
40.0000 meq | EXTENDED_RELEASE_TABLET | Freq: Once | ORAL | Status: AC
  Administered 2019-07-02: 40 meq via ORAL
  Filled 2019-07-02: qty 2

## 2019-07-02 MED ORDER — INSULIN DETEMIR 100 UNIT/ML ~~LOC~~ SOLN
23.0000 [IU] | Freq: Two times a day (BID) | SUBCUTANEOUS | Status: DC
  Administered 2019-07-02 – 2019-07-04 (×6): 23 [IU] via SUBCUTANEOUS
  Filled 2019-07-02 (×8): qty 0.23

## 2019-07-02 NOTE — Progress Notes (Signed)
[facility] BRIEF SICU PROGRESS NOTE  2 Days Post-Op  S/P Procedure(s) (LRB): CORONARY ARTERY BYPASS GRAFTING (CABG) x 4, LIMA TO DIAGONAL 1, SVG TO OML, SVG TO RAMUS, SVG TO PDA (N/A) TRANSESOPHAGEAL ECHOCARDIOGRAM (TEE) (N/A) ENDOVEIN HARVEST OF GREATER SAPHENOUS VEIN (Bilateral)   Stable day NSR w/ stable BP Breathing comfortably w/ O2 sats 97% Diuresing very well  Plan: Continue current plan  Rexene Alberts, MD 07/02/2019 5:22 PM

## 2019-07-02 NOTE — Plan of Care (Signed)
  Problem: Coping: Goal: Level of anxiety will decrease Outcome: Progressing   Problem: Pain Managment: Goal: General experience of comfort will improve Outcome: Progressing   Problem: Health Behavior/Discharge Planning: Goal: Ability to manage health-related needs will improve Outcome: Progressing

## 2019-07-02 NOTE — Progress Notes (Addendum)
[facility] DAILY ICU PROGRESS NOTE                   [address]            [place] [address]          [phone]   2 Days Post-Op Procedure(s) (LRB): CORONARY ARTERY BYPASS GRAFTING (CABG) x 4, LIMA TO DIAGONAL 1, SVG TO OML, SVG TO RAMUS, SVG TO PDA (N/A) TRANSESOPHAGEAL ECHOCARDIOGRAM (TEE) (N/A) ENDOVEIN HARVEST OF GREATER SAPHENOUS VEIN (Bilateral)  Total Length of Stay:  LOS: 2 days   Subjective: Feels good this morning and has already walked two laps.   Objective: Vital signs in last 24 hours: Temp:  [98.2 F (36.8 C)-99.1 F (37.3 C)] 98.8 F (37.1 C) (06/24 0756) Pulse Rate:  [85-105] 95 (06/24 0700) Cardiac Rhythm: Normal sinus rhythm (06/24 0800) Resp:  [14-34] 23 (06/24 0700) BP: (93-134)/(61-78) 104/64 (06/24 0700) SpO2:  [93 %-99 %] 95 % (06/24 0700) Arterial Line BP: (90-140)/(47-63) 111/47 (06/23 1200) Weight:  [103.5 kg] 103.5 kg (06/24 0614)  Filed Weights   06/30/19 0618 07/01/19 0500 07/02/19 0614  Weight: 97.1 kg 104.8 kg 103.5 kg    Weight change: -1.3 kg   Hemodynamic parameters for last 24 hours: PAP: (46-47)/(43) 47/43  Intake/Output from previous day: 06/23 0701 - 06/24 0700 In: 729.6 [I.V.:229.8; IV Piggyback:499.8] Out: 3000 [Urine:2270; Chest Tube:730]  Intake/Output this shift: Total I/O In: 6.4 [I.V.:6.4] Out: -   Current Meds: Scheduled Meds: . sodium chloride   Intravenous Once  . acetaminophen  1,000 mg Oral Q6H   Or  . acetaminophen (TYLENOL) oral liquid 160 mg/5 mL  1,000 mg Per Tube Q6H  . aspirin EC  325 mg Oral Daily   Or  . aspirin  324 mg Per Tube Daily  . bisacodyl  10 mg Oral Daily   Or  . bisacodyl  10 mg Rectal Daily  . Chlorhexidine Gluconate Cloth  6 each Topical Daily  . docusate sodium  200 mg Oral Daily  . ferrous [id number] C-folic acid  1 capsule Oral TID PC  . furosemide  20 mg Intravenous BID  . insulin aspart  0-24 Units Subcutaneous Q4H  . insulin detemir  20 Units  Subcutaneous BID  . metoCLOPramide (REGLAN) injection  10 mg Intravenous Q6H  . metolazone  5 mg Oral Daily  . metoprolol tartrate  25 mg Oral BID  . multivitamin with minerals  1 tablet Oral Daily  . pantoprazole  40 mg Oral Daily  . [START ON 07/03/2019] potassium chloride  20 mEq Oral BID  . rosuvastatin  40 mg Oral QHS  . sodium chloride flush  10-40 mL Intracatheter Q12H  . sodium chloride flush  3 mL Intravenous Q12H   Continuous Infusions: . sodium chloride Stopped (07/01/19 0843)  . sodium chloride    . sodium chloride 10 mL/hr at 06/30/19 1640  . lactated ringers Stopped (07/01/19 0902)  . lactated ringers Stopped (07/01/19 0843)  . milrinone 0.125 mcg/kg/min (07/02/19 0800)  . potassium chloride     PRN Meds:.sodium chloride, dextrose, metoprolol tartrate, midazolam, morphine injection, ondansetron (ZOFRAN) IV, oxyCODONE, sodium chloride flush, sodium chloride flush, traMADol  General appearance: alert, cooperative and no distress Heart: regular rate and rhythm, S1, S2 normal, no murmur, click, rub or gallop Lungs: clear to auscultation bilaterally and diminished at the lower lobes Abdomen: soft, non-tender; bowel sounds normal; no masses,  no organomegaly Extremities: extremities normal, atraumatic, no cyanosis or  edema Wound: clean and dry covered with sterile dressing  Lab Results: CBC: Recent Labs    07/01/19 1535 07/02/19 0520  WBC 9.0 9.0  HGB 8.5* 8.2*  HCT 24.8* 24.0*  PLT 115* 109*   BMET:  Recent Labs    07/01/19 1535 07/02/19 0520  NA 137 134*  K 4.1 3.6  CL 104 100  CO2 25 26  GLUCOSE 209* 188*  BUN 7* 8  CREATININE 0.71 0.74  CALCIUM 8.5* 8.5*    CMET: Lab Results  Component Value Date   WBC 9.0 07/02/2019   HGB 8.2 (L) 07/02/2019   HCT 24.0 (L) 07/02/2019   PLT 109 (L) 07/02/2019   GLUCOSE 188 (H) 07/02/2019   ALT 56 (H) 06/26/2019   AST 32 06/26/2019   NA 134 (L) 07/02/2019   K 3.6 07/02/2019   CL 100 07/02/2019   CREATININE  0.74 07/02/2019   BUN 8 07/02/2019   CO2 26 07/02/2019   INR 1.3 (H) 06/30/2019   HGBA1C 8.0 (H) 06/26/2019      PT/INR:  Recent Labs    06/30/19 1642  LABPROT 16.0*  INR 1.3*   Radiology: No results found.   Assessment/Plan: S/P Procedure(s) (LRB): CORONARY ARTERY BYPASS GRAFTING (CABG) x 4, LIMA TO DIAGONAL 1, SVG TO OML, SVG TO RAMUS, SVG TO PDA (N/A) TRANSESOPHAGEAL ECHOCARDIOGRAM (TEE) (N/A) ENDOVEIN HARVEST OF GREATER SAPHENOUS VEIN (Bilateral)    1. CV-NSR in the 90s. BP well controlled. Asa, remains on milrinone 0.125. coox running.  2. Pulm-tolerating 2L [place], CXR stable. Discontinue mediastinal chest tubes.  3. Renal-creatinine 0.74, weight up 6kg.  4. H and H  8.2/24.0, expected acute blood loss anemia 5. Endo- Blood glucose elevated this morning. Will increase Levemir.   Plan: Will wait for coox and turn off milrinone if > 60. Plan for transfer today to the floor.     Elgie Collard 07/02/2019 8:09 AM  coox dropped to .55 off milrinone  Resume low dose milrinone , change oral metoprolol to coreg and repeat coox in am. Slow decline in Hb  Probably accounts for some decline in coox- cont po Iron  patient examined and medical record reviewed,agree with above note. Tharon Aquas Trigt III 07/02/2019

## 2019-07-03 ENCOUNTER — Inpatient Hospital Stay (HOSPITAL_COMMUNITY): Payer: Medicare Other

## 2019-07-03 ENCOUNTER — Inpatient Hospital Stay: Payer: Self-pay

## 2019-07-03 LAB — CBC
HCT: 25.5 % — ABNORMAL LOW (ref 39.0–52.0)
Hemoglobin: 8.9 g/dL — ABNORMAL LOW (ref 13.0–17.0)
MCH: 32.5 pg (ref 26.0–34.0)
MCHC: 34.9 g/dL (ref 30.0–36.0)
MCV: 93.1 fL (ref 80.0–100.0)
Platelets: 161 10*3/uL (ref 150–400)
RBC: 2.74 MIL/uL — ABNORMAL LOW (ref 4.22–5.81)
RDW: 12.4 % (ref 11.5–15.5)
WBC: 11.6 10*3/uL — ABNORMAL HIGH (ref 4.0–10.5)
nRBC: 0 % (ref 0.0–0.2)

## 2019-07-03 LAB — COOXEMETRY PANEL
Carboxyhemoglobin: 1.5 % (ref 0.5–1.5)
Methemoglobin: 0.8 % (ref 0.0–1.5)
O2 Saturation: 58.4 %
Total hemoglobin: 12 g/dL (ref 12.0–16.0)

## 2019-07-03 LAB — BASIC METABOLIC PANEL
Anion gap: 13 (ref 5–15)
BUN: 10 mg/dL (ref 8–23)
CO2: 29 mmol/L (ref 22–32)
Calcium: 9.6 mg/dL (ref 8.9–10.3)
Chloride: 93 mmol/L — ABNORMAL LOW (ref 98–111)
Creatinine, Ser: 0.87 mg/dL (ref 0.61–1.24)
GFR calc Af Amer: >60 mL/min (ref >60)
GFR calc non Af Amer: >60 mL/min (ref >60)
Glucose, Bld: 154 mg/dL — ABNORMAL HIGH (ref 70–99)
Potassium: 3.4 mmol/L — ABNORMAL LOW (ref 3.5–5.1)
Sodium: 135 mmol/L (ref 135–145)

## 2019-07-03 LAB — GLUCOSE, CAPILLARY
Glucose-Capillary: 143 mg/dL — ABNORMAL HIGH (ref 70–99)
Glucose-Capillary: 173 mg/dL — ABNORMAL HIGH (ref 70–99)
Glucose-Capillary: 147 mg/dL — ABNORMAL HIGH (ref 70–99)
Glucose-Capillary: 195 mg/dL — ABNORMAL HIGH (ref 70–99)

## 2019-07-03 LAB — MAGNESIUM: Magnesium: 1.8 mg/dL (ref 1.7–2.4)

## 2019-07-03 MED ORDER — SODIUM CHLORIDE 0.9% FLUSH: 10.0000 mL | INTRAVENOUS | Status: DC | PRN

## 2019-07-03 MED ORDER — FUROSEMIDE 10 MG/ML IJ SOLN
20.0000 mg | Freq: Every day | INTRAMUSCULAR | Status: DC
  Administered 2019-07-03: 20 mg via INTRAVENOUS
  Filled 2019-07-03: qty 2

## 2019-07-03 MED ORDER — MORPHINE SULFATE (PF) 2 MG/ML IV SOLN: 1.0000 mg | INTRAVENOUS | Status: DC | PRN

## 2019-07-03 MED ORDER — AMIODARONE HCL 200 MG PO TABS
400.0000 mg | ORAL_TABLET | Freq: Two times a day (BID) | ORAL | Status: DC
  Administered 2019-07-03 – 2019-07-04 (×4): 400 mg via ORAL
  Filled 2019-07-03 (×5): qty 2

## 2019-07-03 MED ORDER — POTASSIUM CHLORIDE 10 MEQ/50ML IV SOLN
10.0000 meq | INTRAVENOUS | Status: AC
  Administered 2019-07-03 (×3): 10 meq via INTRAVENOUS
  Filled 2019-07-03 (×3): qty 50

## 2019-07-03 MED ORDER — POTASSIUM CHLORIDE 10 MEQ/50ML IV SOLN
10.0000 meq | INTRAVENOUS | Status: AC
  Administered 2019-07-03 (×2): 10 meq via INTRAVENOUS
  Filled 2019-07-03 (×2): qty 50

## 2019-07-03 MED ORDER — SODIUM CHLORIDE 0.9% FLUSH
10.0000 mL | Freq: Two times a day (BID) | INTRAVENOUS | Status: DC
  Administered 2019-07-03: 10 mL
  Administered 2019-07-04: 20 mL
  Administered 2019-07-05: 10 mL

## 2019-07-03 MED ORDER — POTASSIUM CHLORIDE CRYS ER 20 MEQ PO TBCR
20.0000 meq | EXTENDED_RELEASE_TABLET | Freq: Every day | ORAL | Status: DC
  Administered 2019-07-03 – 2019-07-04 (×2): 20 meq via ORAL
  Filled 2019-07-03: qty 1

## 2019-07-03 MED ORDER — CARVEDILOL 12.5 MG PO TABS
12.5000 mg | ORAL_TABLET | Freq: Two times a day (BID) | ORAL | Status: DC
  Administered 2019-07-03 – 2019-07-05 (×4): 12.5 mg via ORAL
  Filled 2019-07-03 (×4): qty 1

## 2019-07-03 NOTE — Progress Notes (Signed)
Attempted upper R arm PICC placement, unable to thread PICC past R IJ introducer. Attempted many times with positioning of patient, unable to place PICC at this time. Patient asking for break for bathroom, will attempt left upper arm later today.

## 2019-07-03 NOTE — Discharge Summary (Addendum)
[address]       [place] [address]             [phone]      Physician Discharge Summary  Patient ID: Athel Merriweather MRN: [id number] DOB/AGE: [birth year] 68 y.o.  Admit date: 06/30/2019 Discharge date: 07/06/2019  Admission Diagnoses:  Patient Active Problem List   Diagnosis Date Noted   Coronary artery disease 06/30/2019   Angina, class III ([place]) 06/18/2019   CAD in native artery 06/09/2019   Essential hypertension 06/09/2019   Pure hypercholesterolemia 06/09/2019   Diabetes mellitus type 2 in obese ([place]) 06/09/2019   Hyperlipidemia 06/09/2019   Positive cardiac stress test 05/26/2019   Diastasis recti 79/02/4095   Umbilical hernia with obstruction, without gangrene 04/13/2019   Family history of coronary artery disease 11/06/2016   Elevated liver enzymes 03/30/2013   Stress at work 02/12/2012   Hemorrhoids 01/21/2012   Melanoma of skin ([place]) 03/07/2011    Discharge Diagnoses:  Active Problems:   S/P CABG x 4   Coronary artery disease   Discharged Condition: good  HPI:   69 year old none smoker with past history of type 2 diabetes presents for discussion of recently diagnosed severe three-vessel coronary disease.  Patient was seen by Dr. Oval Linsey for exertional shortness of breath and a decline in exercise tolerance.  He had previously undergone a stress test followed by coronary arteriogram and echocardiogram at [facility].  These images show chronic occlusion of the LAD collateralized from the right coronary system.  The mid RCA has an 80% stenosis.  The ramus ramus intermediate has a proximal 80% stenosis in the left circumflex marginal has an 80% stenosis.  LVEDP was 14 mmHg.  The echocardiogram showed some apical hypokinesia but EF approximately 50%.  The aortic valve has some thickening but gradient measurements show to be mild aortic stenosis.   Patient has strong family history for CAD in his father grandfather and uncles.  He  has dyslipidemia and high blood pressure. He states he was told he had a murmur in the past he denies history of rheumatic heart disease or scarlet fever during his childhood.   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  He denies bleeding diathesis.   he is compliant with his medications and follows a heart healthy diet.  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    Hospital Course:   On 06/30/2019 Mr. Elsen underwent a CABG x 4 with Dr. Prescott Gum. He tolerated the procedure well and was transferred to the surgical ICU for continued care. POD 1 he was extubated. POD 2 we continued low-dose milrinone for his heart function. He continues to ambulate around the unit without issue. His mediastinal chest tubes are removed. POD 3 we removed his chest tubes and continued to wean his oxygen. He was stable to transfer to the telemetry unit for continued care. On the floor, he continued to progress. His EPW were removed on 6/26. On 6/27 he had a syncopal episode while using the bathroom. Nursing was by his side therefore, they were able to catch the patient. Per the patient this was due to an episode of low blood pressure. His diuretics were stopped at this time. He continued to ambulate several times that day without a recurrent episode. Today, he is ambulating with limited assistance, he is tolerating room air, his incisions are healing  well, and he is ready for discharge home.   Consults: None  Significant Diagnostic Studies:   CLINICAL DATA:  Status post cardiac surgery.   EXAM: PORTABLE CHEST 1 VIEW   COMPARISON:  July 02, 2019.   FINDINGS: Stable cardiomegaly. Right-sided chest tube has been removed. Left-sided chest tube is unchanged in position. No definite pneumothorax is noted. Right lung is clear. Mild left basilar subsegmental atelectasis is noted. Bony thorax is  unremarkable.   IMPRESSION: Stable left-sided chest tube without definite pneumothorax. Mild left basilar subsegmental atelectasis. Right-sided chest tube has been removed.     Electronically Signed   By: Marijo Conception M.D.   On: 07/03/2019 08:17  Treatments:   NAME: DALTEN, AMBROSINO MEDICAL RECORD XB:[id number] ACCOUNT [id number] DATE OF BIRTH:[birth year] FACILITY: [facility] LOCATION: [facility] PHYSICIAN:Pinkey Mcjunkin VAN TRIGT III, MD   OPERATIVE REPORT   DATE OF PROCEDURE:  06/30/2019   OPERATION: 1.  Coronary artery bypass grafting x4 (left internal mammary artery to diagonal, saphenous vein graft to ramus intermediate, saphenous vein graft to obtuse marginal 1, saphenous vein graft to posterior descending). 2.  Bilateral leg endoscopic harvest of greater saphenous vein.   SURGEON:  Ivin Poot, MD   ASSISTANT:  Lars Pinks, PA-C.   ANESTHESIA:  General by Dr. Lendon Ka.   Discharge Exam: Blood pressure 101/63, pulse 84, temperature 98.6 F (37 C), temperature source Oral, resp. rate 13, height 5\' 7"  (1.702 m), weight 92.9 kg, SpO2 100 %.     General appearance: alert, cooperative and no distress Heart: regular rate and rhythm, S1, S2 normal, no murmur, click, rub or gallop Lungs: clear to auscultation bilaterally Abdomen: soft, non-tender; bowel sounds normal; no masses,  no organomegaly Extremities: 1+ pittiong edema in lower ext Wound: clean and dry  Disposition: Discharge disposition: 01-Home or Self Care       Discharge Instructions     Amb Referral to Cardiac Rehabilitation   Complete by: As directed    Diagnosis: CABG   CABG X ___: 4   After initial evaluation and assessments completed: Virtual Based Care may be provided alone or in conjunction with Phase 2 Cardiac Rehab based on patient barriers.: Yes      Allergies as of 07/06/2019   No Known Allergies      Medication List     STOP taking these medications    amLODipine 10 MG  tablet Commonly known as: NORVASC   metoprolol succinate 25 MG 24 hr tablet Commonly known as: Toprol XL       TAKE these medications    amiodarone 200 MG tablet Commonly known as: PACERONE Take 1 tablet (200 mg total) by mouth daily.   aspirin 325 MG EC tablet Take 1 tablet (325 mg total) by mouth daily. What changed:  medication strength how much to take   carvedilol 6.25 MG tablet Commonly known as: COREG Take 1 tablet (6.25 mg total) by mouth 2 (two) times daily with a meal.   furosemide 40 MG tablet Commonly known as: Lasix Take 1 tablet (40 mg total) by mouth daily.   metFORMIN 500 MG tablet Commonly known as: GLUCOPHAGE Take 1 tablet (500 mg total) by mouth 2 (two) times daily with a meal.   multivitamin with minerals tablet Take 1 tablet by mouth daily.   oxyCODONE 5 MG immediate release tablet Commonly known as: Oxy IR/ROXICODONE Take 1 tablet (5 mg total) by mouth every 6 (six) hours as needed for severe pain.   potassium chloride SA  20 MEQ tablet Commonly known as: KLOR-CON Take 1 tablet (20 mEq total) by mouth daily.   rosuvastatin 40 MG tablet Commonly known as: CRESTOR Take 1 tablet (40 mg total) by mouth at bedtime.        Follow-up Information     [name], MD. Call in 1 day(s).   Specialty: Internal Medicine Contact information: [place] [address] [phone]         Deberah Pelton, NP Follow up.   Specialty: Cardiology Why: Your appointment is on 07/17/2019 at 2:15pm Contact information: [address] [place] [address] [phone]         Ivin Poot, MD Follow up.   Specialty: Cardiothoracic Surgery Why: Your appointment is on 08/05/2019 at 10:00am. Please arrive at 9:30am for a chest xray located at [facility] imaging which is on the first floor of our building Contact information: [place] [address] [phone]                 The patient has been discharged on:   1.Beta Blocker:  Yes [ yes  ]                              No   [   ]                              If No, reason:  2.Ace Inhibitor/ARB: Yes [   ]                                     No  [  no  ]                                     If No, reason: hypotension  3.Statin:   Yes [ yes  ]                  No  [   ]                  If No, reason:  4.Ecasa:  Yes  [ yes  ]                  No   [   ]                  If No, reason:    Signed: Elgie Collard 07/06/2019, 7:57 AM   Dc instructions reviewed with patient  patient examined and medical record reviewed,agree with above note. Tharon Aquas Trigt III 07/07/2019

## 2019-07-03 NOTE — Progress Notes (Signed)
Peripherally Inserted Central Catheter Placement  The IV Nurse has discussed with the patient and/or persons authorized to consent for the patient, the purpose of this procedure and the potential benefits and risks involved with this procedure.  The benefits include less needle sticks, lab draws from the catheter, and the patient may be discharged home with the catheter. Risks include, but not limited to, infection, bleeding, blood clot (thrombus formation), and puncture of an artery; nerve damage and irregular heartbeat and possibility to perform a PICC exchange if needed/ordered by physician.  Alternatives to this procedure were also discussed.  Bard Power PICC patient education guide, fact sheet on infection prevention and patient information card has been provided to patient /or left at bedside.    PICC Placement Documentation        Phillip [name] 07/03/2019, 11:49 AM

## 2019-07-03 NOTE — Progress Notes (Addendum)
      [address]       [place] [address]             [phone]      3 Days Post-Op Procedure(s) (LRB): CORONARY ARTERY BYPASS GRAFTING (CABG) x 4, LIMA TO DIAGONAL 1, SVG TO OML, SVG TO RAMUS, SVG TO PDA (N/A) TRANSESOPHAGEAL ECHOCARDIOGRAM (TEE) (N/A) ENDOVEIN HARVEST OF GREATER SAPHENOUS VEIN (Bilateral) Subjective: Feels okay this morning. Wants to go home soon.   Objective: Vital signs in last 24 hours: Temp:  [97.8 F (36.6 C)-99.6 F (37.6 C)] 97.8 F (36.6 C) (06/25 0400) Pulse Rate:  [85-108] 101 (06/25 0700) Cardiac Rhythm: Normal sinus rhythm (06/25 0400) Resp:  [16-33] 22 (06/25 0700) BP: (101-126)/(66-81) 120/75 (06/25 0700) SpO2:  [92 %-99 %] 97 % (06/25 0700) Weight:  [98.8 kg] 98.8 kg (06/25 0500)     Intake/Output from previous day: 06/24 0701 - 06/25 0700 In: 105.5 [I.V.:93.4; IV Piggyback:12.1] Out: 2878 [Urine:5325; Chest Tube:250] Intake/Output this shift: No intake/output data recorded.  General appearance: alert, cooperative and no distress Heart: regular rate and rhythm, S1, S2 normal, no murmur, click, rub or gallop Lungs: clear to auscultation bilaterally Abdomen: soft, non-tender; bowel sounds normal; no masses,  no organomegaly Extremities: extremities normal, atraumatic, no cyanosis or edema Wound: clean and dry  Lab Results: Recent Labs    07/02/19 0520 07/03/19 0433  WBC 9.0 11.6*  HGB 8.2* 8.9*  HCT 24.0* 25.5*  PLT 109* 161   BMET:  Recent Labs    07/02/19 0520 07/03/19 0433  NA 134* 135  K 3.6 3.4*  CL 100 93*  CO2 26 29  GLUCOSE 188* 154*  BUN 8 10  CREATININE 0.74 0.87  CALCIUM 8.5* 9.6    PT/INR:  Recent Labs    06/30/19 1642  LABPROT 16.0*  INR 1.3*   ABG    Component Value Date/Time   PHART 7.390 07/01/2019 0409   HCO3 25.0 07/01/2019 0409   TCO2 26 07/01/2019 0409   ACIDBASEDEF 2.0 06/30/2019 1939   O2SAT 58.4 07/03/2019 0433   CBG (last 3)  Recent Labs    07/02/19 2000  07/02/19 2202 07/03/19 0640  GLUCAP 157* 128* 143*    Assessment/Plan: S/P Procedure(s) (LRB): CORONARY ARTERY BYPASS GRAFTING (CABG) x 4, LIMA TO DIAGONAL 1, SVG TO OML, SVG TO RAMUS, SVG TO PDA (N/A) TRANSESOPHAGEAL ECHOCARDIOGRAM (TEE) (N/A) ENDOVEIN HARVEST OF GREATER SAPHENOUS VEIN (Bilateral)  1. CV-NSR in the 90s. BP well controlled. Asa,remains on milrinone 0.125. coox 58.  2. Pulm-tolerating 1L [place], CXR stable. CXR stable, 150cc overnight out of chest tubes. 3. Renal-creatinine 0.87, weight up 1kg. Will decrease lasix to once daily 4. H and H 8.9/25.5, expected acute blood loss anemia 5. Endo- Blood glucose well controlled. Continue SSI and Levemir  Plan: Probably can remove chest tubes today. OOB to chair. Ambulating in the halls. Continue to wean oxygen. Hopefully able to transfer to the floor today.    LOS: 3 days    Elgie Collard 07/03/2019   Sinus tach with systolic BP in 67E Coox 58 % on milrinone .125 mcg- cont another day Transfer to progressive care Start po amiodarone for increasing HR Chest tubes and neck line out  patient examined and medical record reviewed,agree with above note. Tharon Aquas Trigt III 07/03/2019

## 2019-07-03 NOTE — Progress Notes (Signed)
CARDIAC REHAB PHASE I   Went to offer to walk with pt, pt currently getting PICC placed. Will continue to follow throughout hospital stay.  Rufina Falco, RN BSN 07/03/2019 1:33 PM

## 2019-07-03 NOTE — Progress Notes (Signed)
Peripherally Inserted Central Catheter Placement  The IV Nurse has discussed with the patient and/or persons authorized to consent for the patient, the purpose of this procedure and the potential benefits and risks involved with this procedure.  The benefits include less needle sticks, lab draws from the catheter, and the patient may be discharged home with the catheter. Risks include, but not limited to, infection, bleeding, blood clot (thrombus formation), and puncture of an artery; nerve damage and irregular heartbeat and possibility to perform a PICC exchange if needed/ordered by physician.  Alternatives to this procedure were also discussed.  Bard Power PICC patient education guide, fact sheet on infection prevention and patient information card has been provided to patient /or left at bedside.    PICC Placement Documentation  PICC Double Lumen 27/12/92 PICC Left Basilic 47 cm 0 cm (Active)  Indication for Insertion or Continuance of Line Prolonged intravenous therapies 07/03/19 1400  Exposed Catheter (cm) 0 cm 07/03/19 1400  Site Assessment Clean;Dry;Intact 07/03/19 1400  Lumen #1 Status Flushed;Blood return noted 07/03/19 1400  Lumen #2 Status Flushed;Blood return noted 07/03/19 1400  Dressing Type Transparent 07/03/19 1400  Dressing Status Clean;Dry;Intact;Antimicrobial disc in place 07/03/19 1400  Dressing Change Due 07/10/19 07/03/19 1400       Jule Economy Horton 07/03/2019, 2:16 PM

## 2019-07-03 NOTE — Plan of Care (Signed)
  Problem: Education: Goal: Knowledge of General Education information will improve Description: Including pain rating scale, medication(s)/side effects and non-pharmacologic comfort measures 07/03/2019 2059 by Mikey Bussing, RN Outcome: Progressing 07/03/2019 2059 by Mikey Bussing, RN Outcome: Progressing   Problem: Health Behavior/Discharge Planning: Goal: Ability to manage health-related needs will improve 07/03/2019 2059 by Mikey Bussing, RN Outcome: Progressing 07/03/2019 2059 by Mikey Bussing, RN Outcome: Progressing   Problem: Clinical Measurements: Goal: Ability to maintain clinical measurements within normal limits will improve 07/03/2019 2059 by Mikey Bussing, RN Outcome: Progressing 07/03/2019 2059 by Mikey Bussing, RN Outcome: Progressing Goal: Will remain free from infection 07/03/2019 2059 by Mikey Bussing, RN Outcome: Progressing 07/03/2019 2059 by Mikey Bussing, RN Outcome: Progressing Goal: Diagnostic test results will improve 07/03/2019 2059 by Mikey Bussing, RN Outcome: Progressing 07/03/2019 2059 by Mikey Bussing, RN Outcome: Progressing Goal: Respiratory complications will improve 07/03/2019 2059 by Mikey Bussing, RN Outcome: Progressing 07/03/2019 2059 by Mikey Bussing, RN Outcome: Progressing Goal: Cardiovascular complication will be avoided 07/03/2019 2059 by Mikey Bussing, RN Outcome: Progressing 07/03/2019 2059 by Mikey Bussing, RN Outcome: Progressing   Problem: Activity: Goal: Risk for activity intolerance will decrease 07/03/2019 2059 by Mikey Bussing, RN Outcome: Progressing 07/03/2019 2059 by Mikey Bussing, RN Outcome: Progressing   Problem: Nutrition: Goal: Adequate nutrition will be maintained 07/03/2019 2059 by Mikey Bussing, RN Outcome: Progressing 07/03/2019 2059 by Mikey Bussing, RN Outcome: Progressing   Problem: Coping: Goal: Level of anxiety will decrease 07/03/2019 2059 by Mikey Bussing, RN Outcome: Progressing 07/03/2019 2059 by Mikey Bussing, RN Outcome: Progressing    Problem: Elimination: Goal: Will not experience complications related to bowel motility 07/03/2019 2059 by Mikey Bussing, RN Outcome: Progressing 07/03/2019 2059 by Mikey Bussing, RN Outcome: Progressing Goal: Will not experience complications related to urinary retention 07/03/2019 2059 by Mikey Bussing, RN Outcome: Progressing 07/03/2019 2059 by Mikey Bussing, RN Outcome: Progressing   Problem: Pain Managment: Goal: General experience of comfort will improve 07/03/2019 2059 by Mikey Bussing, RN Outcome: Progressing 07/03/2019 2059 by Mikey Bussing, RN Outcome: Progressing   Problem: Safety: Goal: Ability to remain free from injury will improve 07/03/2019 2059 by Mikey Bussing, RN Outcome: Progressing 07/03/2019 2059 by Mikey Bussing, RN Outcome: Progressing   Problem: Skin Integrity: Goal: Risk for impaired skin integrity will decrease 07/03/2019 2059 by Mikey Bussing, RN Outcome: Progressing 07/03/2019 2059 by Mikey Bussing, RN Outcome: Progressing

## 2019-07-04 ENCOUNTER — Inpatient Hospital Stay (HOSPITAL_COMMUNITY): Payer: Medicare Other

## 2019-07-04 LAB — TYPE AND SCREEN
ABO/RH(D): O POS
Antibody Screen: NEGATIVE
Unit division: 0
Unit division: 0

## 2019-07-04 LAB — COOXEMETRY PANEL
Carboxyhemoglobin: 1.4 % (ref 0.5–1.5)
Carboxyhemoglobin: 1.6 % — ABNORMAL HIGH (ref 0.5–1.5)
Methemoglobin: 0.7 % (ref 0.0–1.5)
Methemoglobin: 0.6 % (ref 0.0–1.5)
O2 Saturation: 39.3 %
O2 Saturation: 46.7 %
Total hemoglobin: 10.5 g/dL — ABNORMAL LOW (ref 12.0–16.0)
Total hemoglobin: 12 g/dL (ref 12.0–16.0)

## 2019-07-04 LAB — GLUCOSE, CAPILLARY
Glucose-Capillary: 166 mg/dL — ABNORMAL HIGH (ref 70–99)
Glucose-Capillary: 133 mg/dL — ABNORMAL HIGH (ref 70–99)
Glucose-Capillary: 168 mg/dL — ABNORMAL HIGH (ref 70–99)
Glucose-Capillary: 153 mg/dL — ABNORMAL HIGH (ref 70–99)

## 2019-07-04 LAB — BASIC METABOLIC PANEL
Anion gap: 14 (ref 5–15)
BUN: 14 mg/dL (ref 8–23)
CO2: 28 mmol/L (ref 22–32)
Calcium: 10 mg/dL (ref 8.9–10.3)
Chloride: 91 mmol/L — ABNORMAL LOW (ref 98–111)
Creatinine, Ser: 1.02 mg/dL (ref 0.61–1.24)
GFR calc Af Amer: >60 mL/min (ref >60)
GFR calc non Af Amer: >60 mL/min (ref >60)
Glucose, Bld: 155 mg/dL — ABNORMAL HIGH (ref 70–99)
Potassium: 3.1 mmol/L — ABNORMAL LOW (ref 3.5–5.1)
Sodium: 133 mmol/L — ABNORMAL LOW (ref 135–145)

## 2019-07-04 LAB — BPAM RBC
Blood Product Expiration Date: 202107152359
Blood Product Expiration Date: 202107162359
ISSUE DATE / TIME: 202106221413
ISSUE DATE / TIME: 202106221413
Unit Type and Rh: 5100
Unit Type and Rh: 5100

## 2019-07-04 LAB — CBC
HCT: 28.7 % — ABNORMAL LOW (ref 39.0–52.0)
Hemoglobin: 10.3 g/dL — ABNORMAL LOW (ref 13.0–17.0)
MCH: 32.9 pg (ref 26.0–34.0)
MCHC: 35.9 g/dL (ref 30.0–36.0)
MCV: 91.7 fL (ref 80.0–100.0)
Platelets: 299 10*3/uL (ref 150–400)
RBC: 3.13 MIL/uL — ABNORMAL LOW (ref 4.22–5.81)
RDW: 12.3 % (ref 11.5–15.5)
WBC: 13.7 10*3/uL — ABNORMAL HIGH (ref 4.0–10.5)
nRBC: 0 % (ref 0.0–0.2)

## 2019-07-04 MED ORDER — POTASSIUM CHLORIDE CRYS ER 20 MEQ PO TBCR
40.0000 meq | EXTENDED_RELEASE_TABLET | Freq: Two times a day (BID) | ORAL | Status: DC
  Administered 2019-07-04 – 2019-07-06 (×4): 40 meq via ORAL
  Filled 2019-07-04 (×4): qty 2

## 2019-07-04 MED ORDER — FUROSEMIDE 40 MG PO TABS
40.0000 mg | ORAL_TABLET | Freq: Every day | ORAL | Status: DC
  Administered 2019-07-04: 40 mg via ORAL
  Filled 2019-07-04 (×2): qty 1

## 2019-07-04 MED ORDER — ALBUMIN HUMAN 5 % IV SOLN
12.5000 g | Freq: Once | INTRAVENOUS | Status: AC
  Administered 2019-07-05: 12.5 g via INTRAVENOUS
  Filled 2019-07-04: qty 250

## 2019-07-04 MED ORDER — MIDODRINE HCL 5 MG PO TABS
10.0000 mg | ORAL_TABLET | Freq: Once | ORAL | Status: AC
  Administered 2019-07-04: 10 mg via ORAL
  Filled 2019-07-04: qty 2

## 2019-07-04 NOTE — Progress Notes (Signed)
Paged E. Barrett PA regarding coox results, will keep on milrinone gtt.

## 2019-07-04 NOTE — Progress Notes (Signed)
      [address]       [place] [address]             [phone]      4 Days Post-Op Procedure(s) (LRB): CORONARY ARTERY BYPASS GRAFTING (CABG) x 4, LIMA TO DIAGONAL 1, SVG TO OML, SVG TO RAMUS, SVG TO PDA (N/A) TRANSESOPHAGEAL ECHOCARDIOGRAM (TEE) (N/A) ENDOVEIN HARVEST OF GREATER SAPHENOUS VEIN (Bilateral)   Subjective:  Didn't sleep well last night.  Otherwise doing pretty well.   Objective: Vital signs in last 24 hours: Temp:  [97.8 F (36.6 C)-98.7 F (37.1 C)] 98.7 F (37.1 C) (06/26 0735) Pulse Rate:  [98-107] 99 (06/26 0735) Cardiac Rhythm: Normal sinus rhythm (06/26 0735) Resp:  [15-30] 18 (06/26 0735) BP: (91-127)/(56-84) 101/56 (06/26 0735) SpO2:  [95 %-100 %] 95 % (06/26 0735) Weight:  [95.2 kg] 95.2 kg (06/26 0352)  Intake/Output from previous day: 06/25 0701 - 06/26 0700 In: 250.5 [I.V.:71.9; IV Piggyback:178.5] Out: 1696 [Urine:1675; Stool:1; Chest Tube:20] Intake/Output this shift: Total I/O In: 150.7 [P.O.:120; I.V.:30.7] Out: -   General appearance: alert, cooperative and no distress Heart: regular rate and rhythm Lungs: clear to auscultation bilaterally Abdomen: soft, non-tender; bowel sounds normal; no masses,  no organomegaly Extremities: edema trace Wound: clean and dry  Lab Results: Recent Labs    07/03/19 0433 07/04/19 0338  WBC 11.6* 13.7*  HGB 8.9* 10.3*  HCT 25.5* 28.7*  PLT 161 299   BMET:  Recent Labs    07/03/19 0433 07/04/19 0338  NA 135 133*  K 3.4* 3.1*  CL 93* 91*  CO2 29 28  GLUCOSE 154* 155*  BUN 10 14  CREATININE 0.87 1.02  CALCIUM 9.6 10.0    PT/INR: No results for input(s): LABPROT, INR in the last 72 hours. ABG    Component Value Date/Time   PHART 7.390 07/01/2019 0409   HCO3 25.0 07/01/2019 0409   TCO2 26 07/01/2019 0409   ACIDBASEDEF 2.0 06/30/2019 1939   O2SAT 39.3 07/04/2019 0358   CBG (last 3)  Recent Labs    07/03/19 1549 07/03/19 2129 07/04/19 0631  GLUCAP 147* 195*  166*    Assessment/Plan: S/P Procedure(s) (LRB): CORONARY ARTERY BYPASS GRAFTING (CABG) x 4, LIMA TO DIAGONAL 1, SVG TO OML, SVG TO RAMUS, SVG TO PDA (N/A) TRANSESOPHAGEAL ECHOCARDIOGRAM (TEE) (N/A) ENDOVEIN HARVEST OF GREATER SAPHENOUS VEIN (Bilateral)  1. CV- NSR, BP stable- on Milrinone at 0.125, Coreg, Amiodarone coox this morning dropped to 38% from 58.. I suspect this isn't accurate will repeat 2. Pulm- no acute issues, continue IS 3. Renal-creatinine has been stable, weight is trending down, transition to Lasix 40 mg daily 4. Hypokalemia- supplement K 5. DM- preoperative A1c was 8.0, will start Metformin at discharge, will need close follow up with PCP 6. Dispo- patient stable, recheck Co-ox level drop to 38% from 58%, continue diuretics, d/c EPW today, continue current care   LOS: 4 days    Ellwood Handler, PA-C 07/04/2019

## 2019-07-04 NOTE — Progress Notes (Addendum)
Pt became unsteady on feet while ambulating to bathroom. RN then guided pt to bathroom floor. Pt did not sustain any injuries or lose consciousness. Pt was then helped back to bed. Will continue to monitor.  Atkins, MD paged to notify about pt fall and low BP (81/57). RN instructed to hold milrinone and give albumin and midorine. Will continue to monitor.

## 2019-07-04 NOTE — Plan of Care (Signed)

## 2019-07-04 NOTE — Progress Notes (Signed)
1030-1050 Pt on bedrest for pacing wire removal. Education completed with pt and significant other. Reviewed sternal precautions and staying in the tube. Discussed walking for exercise. Gave diabetic and heart healthy diets and discussed carb counting. Discussed CRP 2 and referred to [facility] CRP 2. Will return to walk in an hour.  Encouraged IS. Graylon Good RN BSN 07/04/2019 10:56 AM

## 2019-07-04 NOTE — Progress Notes (Signed)
1133 Returned to walk. Pt still tired and wants to sleep.  Encouraged him to use IS and walk later with staff.Graylon Good RN BSN 07/04/2019 11:34 AM

## 2019-07-05 ENCOUNTER — Inpatient Hospital Stay (HOSPITAL_COMMUNITY): Payer: Medicare Other

## 2019-07-05 LAB — CBC
HCT: 25.8 % — ABNORMAL LOW (ref 39.0–52.0)
Hemoglobin: 9.1 g/dL — ABNORMAL LOW (ref 13.0–17.0)
MCH: 32.2 pg (ref 26.0–34.0)
MCHC: 35.3 g/dL (ref 30.0–36.0)
MCV: 91.2 fL (ref 80.0–100.0)
Platelets: 271 10*3/uL (ref 150–400)
RBC: 2.83 MIL/uL — ABNORMAL LOW (ref 4.22–5.81)
RDW: 12 % (ref 11.5–15.5)
WBC: 11.4 10*3/uL — ABNORMAL HIGH (ref 4.0–10.5)
nRBC: 0.2 % (ref 0.0–0.2)

## 2019-07-05 LAB — BASIC METABOLIC PANEL
Anion gap: 10 (ref 5–15)
BUN: 21 mg/dL (ref 8–23)
CO2: 34 mmol/L — ABNORMAL HIGH (ref 22–32)
Calcium: 9.3 mg/dL (ref 8.9–10.3)
Chloride: 92 mmol/L — ABNORMAL LOW (ref 98–111)
Creatinine, Ser: 1.2 mg/dL (ref 0.61–1.24)
GFR calc Af Amer: >60 mL/min (ref >60)
GFR calc non Af Amer: >60 mL/min (ref >60)
Glucose, Bld: 127 mg/dL — ABNORMAL HIGH (ref 70–99)
Potassium: 3 mmol/L — ABNORMAL LOW (ref 3.5–5.1)
Sodium: 136 mmol/L (ref 135–145)

## 2019-07-05 LAB — GLUCOSE, CAPILLARY
Glucose-Capillary: 121 mg/dL — ABNORMAL HIGH (ref 70–99)
Glucose-Capillary: 125 mg/dL — ABNORMAL HIGH (ref 70–99)
Glucose-Capillary: 131 mg/dL — ABNORMAL HIGH (ref 70–99)
Glucose-Capillary: 168 mg/dL — ABNORMAL HIGH (ref 70–99)

## 2019-07-05 LAB — COOXEMETRY PANEL
Carboxyhemoglobin: 2 % — ABNORMAL HIGH (ref 0.5–1.5)
Methemoglobin: 0.9 % (ref 0.0–1.5)
O2 Saturation: 55.7 %
Total hemoglobin: 16.8 g/dL — ABNORMAL HIGH (ref 12.0–16.0)

## 2019-07-05 MED ORDER — METFORMIN HCL 500 MG PO TABS
500.0000 mg | ORAL_TABLET | Freq: Two times a day (BID) | ORAL | Status: DC
  Administered 2019-07-05 – 2019-07-06 (×2): 500 mg via ORAL
  Filled 2019-07-05 (×2): qty 1

## 2019-07-05 MED ORDER — CARVEDILOL 6.25 MG PO TABS
6.2500 mg | ORAL_TABLET | Freq: Two times a day (BID) | ORAL | Status: DC
  Administered 2019-07-05 – 2019-07-06 (×2): 6.25 mg via ORAL
  Filled 2019-07-05 (×2): qty 1

## 2019-07-05 MED ORDER — AMIODARONE HCL 200 MG PO TABS
200.0000 mg | ORAL_TABLET | Freq: Two times a day (BID) | ORAL | Status: DC
  Administered 2019-07-05 – 2019-07-06 (×3): 200 mg via ORAL
  Filled 2019-07-05 (×2): qty 1

## 2019-07-05 NOTE — Plan of Care (Signed)

## 2019-07-05 NOTE — Progress Notes (Signed)
5 Days Post-Op Procedure(s) (LRB): CORONARY ARTERY BYPASS GRAFTING (CABG) x 4, LIMA TO DIAGONAL 1, SVG TO OML, SVG TO RAMUS, SVG TO PDA (N/A) TRANSESOPHAGEAL ECHOCARDIOGRAM (TEE) (N/A) ENDOVEIN HARVEST OF GREATER SAPHENOUS VEIN (Bilateral) Subjective: Had orthostatic controlled fall in bathroom last nite- probably dry Medications adjusted, lasix stopped[ below preop wt]  BP better this am Objective: Vital signs in last 24 hours: Temp:  [98.1 F (36.7 C)-98.7 F (37.1 C)] 98.7 F (37.1 C) (06/27 0750) Pulse Rate:  [78-100] 78 (06/27 0750) Cardiac Rhythm: Normal sinus rhythm (06/27 0701) Resp:  [16-20] 20 (06/27 0750) BP: (81-109)/(57-70) 103/70 (06/27 0750) SpO2:  [94 %-98 %] 94 % (06/27 0750) Weight:  [93.8 kg] 93.8 kg (06/27 0359)  Hemodynamic parameters for last 24 hours:  nsr  Intake/Output from previous day: 06/26 0701 - 06/27 0700 In: 992.4 [P.O.:720; I.V.:90.7; IV Piggyback:181.7] Out: 2250 [Urine:2250] Intake/Output this shift: Total I/O In: 240 [P.O.:240] Out: 350 [Urine:350]       Exam    General- alert and comfortable    Neck- no JVD, no cervical adenopathy palpable, no carotid bruit   Lungs- clear without rales, wheezes   Cor- regular rate and rhythm, no murmur , gallop   Abdomen- soft, non-tender   Extremities - warm, non-tender, minimal edema   Neuro- oriented, appropriate, no focal weakness   Lab Results: Recent Labs    07/04/19 0338 07/05/19 0254  WBC 13.7* 11.4*  HGB 10.3* 9.1*  HCT 28.7* 25.8*  PLT 299 271   BMET:  Recent Labs    07/04/19 0338 07/05/19 0254  NA 133* 136  K 3.1* 3.0*  CL 91* 92*  CO2 28 34*  GLUCOSE 155* 127*  BUN 14 21  CREATININE 1.02 1.20  CALCIUM 10.0 9.3    PT/INR: No results for input(s): LABPROT, INR in the last 72 hours. ABG    Component Value Date/Time   PHART 7.390 07/01/2019 0409   HCO3 25.0 07/01/2019 0409   TCO2 26 07/01/2019 0409   ACIDBASEDEF 2.0 06/30/2019 1939   O2SAT 55.7 07/05/2019 0245    CBG (last 3)  Recent Labs    07/04/19 1617 07/04/19 2111 07/05/19 0639  GLUCAP 168* 153* 121*    Assessment/Plan: S/P Procedure(s) (LRB): CORONARY ARTERY BYPASS GRAFTING (CABG) x 4, LIMA TO DIAGONAL 1, SVG TO OML, SVG TO RAMUS, SVG TO PDA (N/A) TRANSESOPHAGEAL ECHOCARDIOGRAM (TEE) (N/A) ENDOVEIN HARVEST OF GREATER SAPHENOUS VEIN (Bilateral) Keep in hospital today for obsewrvation Stop lasix, reduce coreg, amiodarone CXR today clear Check postop echo to assess pericardial effusion Ambulate with CR, home tomorrow pm  if stable  LOS: 5 days    Phillip [name] 07/05/2019

## 2019-07-06 ENCOUNTER — Inpatient Hospital Stay (HOSPITAL_COMMUNITY): Payer: Medicare Other

## 2019-07-06 DIAGNOSIS — I313 Pericardial effusion (noninflammatory): Secondary | ICD-10-CM

## 2019-07-06 LAB — CBC
HCT: 27.3 % — ABNORMAL LOW (ref 39.0–52.0)
Hemoglobin: 9.6 g/dL — ABNORMAL LOW (ref 13.0–17.0)
MCH: 32.2 pg (ref 26.0–34.0)
MCHC: 35.2 g/dL (ref 30.0–36.0)
MCV: 91.6 fL (ref 80.0–100.0)
Platelets: 294 10*3/uL (ref 150–400)
RBC: 2.98 MIL/uL — ABNORMAL LOW (ref 4.22–5.81)
RDW: 12.5 % (ref 11.5–15.5)
WBC: 12.3 10*3/uL — ABNORMAL HIGH (ref 4.0–10.5)
nRBC: 0.2 % (ref 0.0–0.2)

## 2019-07-06 LAB — BASIC METABOLIC PANEL
Anion gap: 10 (ref 5–15)
BUN: 21 mg/dL (ref 8–23)
CO2: 32 mmol/L (ref 22–32)
Calcium: 9.6 mg/dL (ref 8.9–10.3)
Chloride: 93 mmol/L — ABNORMAL LOW (ref 98–111)
Creatinine, Ser: 1.14 mg/dL (ref 0.61–1.24)
GFR calc Af Amer: >60 mL/min (ref >60)
GFR calc non Af Amer: >60 mL/min (ref >60)
Glucose, Bld: 153 mg/dL — ABNORMAL HIGH (ref 70–99)
Potassium: 3.2 mmol/L — ABNORMAL LOW (ref 3.5–5.1)
Sodium: 135 mmol/L (ref 135–145)

## 2019-07-06 LAB — COOXEMETRY PANEL
Carboxyhemoglobin: 2.1 % — ABNORMAL HIGH (ref 0.5–1.5)
Methemoglobin: 1.2 % (ref 0.0–1.5)
O2 Saturation: 53.1 %
Total hemoglobin: 9.7 g/dL — ABNORMAL LOW (ref 12.0–16.0)

## 2019-07-06 LAB — GLUCOSE, CAPILLARY: Glucose-Capillary: 140 mg/dL — ABNORMAL HIGH (ref 70–99)

## 2019-07-06 LAB — ECHOCARDIOGRAM LIMITED
Height: 67 in
Weight: 3276.92 oz

## 2019-07-06 MED ORDER — METFORMIN HCL 500 MG PO TABS: 500.0000 mg | ORAL_TABLET | Freq: Two times a day (BID) | ORAL | 1 refills | Status: DC

## 2019-07-06 MED ORDER — OXYCODONE HCL 5 MG PO TABS: 5.0000 mg | ORAL_TABLET | Freq: Four times a day (QID) | ORAL | 0 refills | Status: DC | PRN

## 2019-07-06 MED ORDER — AMIODARONE HCL 200 MG PO TABS: 200.0000 mg | ORAL_TABLET | Freq: Every day | ORAL | 1 refills | Status: DC

## 2019-07-06 MED ORDER — FUROSEMIDE 40 MG PO TABS
40.0000 mg | ORAL_TABLET | Freq: Every day | ORAL | 0 refills | Status: DC
Start: 2019-07-06 — End: 2019-07-17

## 2019-07-06 MED ORDER — ASPIRIN 325 MG PO TBEC: 325.0000 mg | DELAYED_RELEASE_TABLET | Freq: Every day | ORAL | 0 refills | Status: DC

## 2019-07-06 MED ORDER — CARVEDILOL 6.25 MG PO TABS: 6.2500 mg | ORAL_TABLET | Freq: Two times a day (BID) | ORAL | 1 refills | Status: DC

## 2019-07-06 MED ORDER — POTASSIUM CHLORIDE CRYS ER 20 MEQ PO TBCR: 20.0000 meq | EXTENDED_RELEASE_TABLET | Freq: Every day | ORAL | 0 refills | Status: DC

## 2019-07-06 NOTE — Plan of Care (Signed)

## 2019-07-06 NOTE — Progress Notes (Addendum)
      [address]       [place] [address]             [phone]      6 Days Post-Op Procedure(s) (LRB): CORONARY ARTERY BYPASS GRAFTING (CABG) x 4, LIMA TO DIAGONAL 1, SVG TO OML, SVG TO RAMUS, SVG TO PDA (N/A) TRANSESOPHAGEAL ECHOCARDIOGRAM (TEE) (N/A) ENDOVEIN HARVEST OF GREATER SAPHENOUS VEIN (Bilateral) Subjective: Feels good this morning and wants to go home  Objective: Vital signs in last 24 hours: Temp:  [98.1 F (36.7 C)-98.9 F (37.2 C)] 98.6 F (37 C) (06/28 0350) Pulse Rate:  [75-78] 77 (06/28 0350) Cardiac Rhythm: Normal sinus rhythm (06/28 0700) Resp:  [14-22] 14 (06/28 0350) BP: (91-108)/(56-74) 97/61 (06/28 0350) SpO2:  [94 %-98 %] 98 % (06/28 0350) Weight:  [92.9 kg] 92.9 kg (06/28 0350)     Intake/Output from previous day: 06/27 0701 - 06/28 0700 In: 720 [P.O.:720] Out: 900 [Urine:900] Intake/Output this shift: No intake/output data recorded.  General appearance: alert, cooperative and no distress Heart: regular rate and rhythm, S1, S2 normal, no murmur, click, rub or gallop Lungs: clear to auscultation bilaterally Abdomen: soft, non-tender; bowel sounds normal; no masses,  no organomegaly Extremities: 1+ pittiong edema in lower ext Wound: clean and dry  Lab Results: Recent Labs    07/05/19 0254 07/06/19 0419  WBC 11.4* 12.3*  HGB 9.1* 9.6*  HCT 25.8* 27.3*  PLT 271 294   BMET:  Recent Labs    07/05/19 0254 07/06/19 0419  NA 136 135  K 3.0* 3.2*  CL 92* 93*  CO2 34* 32  GLUCOSE 127* 153*  BUN 21 21  CREATININE 1.20 1.14  CALCIUM 9.3 9.6    PT/INR: No results for input(s): LABPROT, INR in the last 72 hours. ABG    Component Value Date/Time   PHART 7.390 07/01/2019 0409   HCO3 25.0 07/01/2019 0409   TCO2 26 07/01/2019 0409   ACIDBASEDEF 2.0 06/30/2019 1939   O2SAT 53.1 07/06/2019 0419   CBG (last 3)  Recent Labs    07/05/19 1655 07/05/19 2109 07/06/19 0552  GLUCAP 131* 168* 140*     Assessment/Plan: S/P Procedure(s) (LRB): CORONARY ARTERY BYPASS GRAFTING (CABG) x 4, LIMA TO DIAGONAL 1, SVG TO OML, SVG TO RAMUS, SVG TO PDA (N/A) TRANSESOPHAGEAL ECHOCARDIOGRAM (TEE) (N/A) ENDOVEIN HARVEST OF GREATER SAPHENOUS VEIN (Bilateral)  1. CV- NSR in the 70s, BP well controlled. Continue Amio 200mg  BID, Asa, statin and Coreg 2. Pulm- tolerating room air with good oxygen saturation. Small left pleural effusion on CXR today 3. Renal-hypokalemia-replacing with 40MEQ BID. Creatinine 1.14. Lasix stopped due to orthostatic fall 4. H and H 9.6/27.3, expected acute blood loss anemia 5. Endo-blood glucose well controlled  Plan: EPW out, walked several times yesterday without symptoms. He thinks his syncopal episode was due to a sudden drop in blood pressure. Will remove PICC line today and send home this afternoon as long as he remains stable.     LOS: 6 days    Phillip [name] 07/06/2019  DC instructions reviewed with patient Home after Echocardiogram reviewed patient examined and medical record reviewed,agree with above note. Phillip [name] 07/06/2019

## 2019-07-06 NOTE — Care Management Important Message (Signed)
Important Message  Patient Details  Name: Phillip [name] MRN: [id number] Date of Birth: February 21, 1950   Medicare Important Message Given:  Yes     Sylar Voong Montine Circle 07/06/2019, 3:31 PM

## 2019-07-06 NOTE — Progress Notes (Signed)
Echocardiogram 2D Echocardiogram has been performed.  Oneal Deputy Braden Deloach 07/06/2019, 9:08 AM

## 2019-07-06 NOTE — Progress Notes (Signed)
Patient discharge instructions reviewed and both patient and significant other verbalize understanding.  A written copy was given to patient as well.  Patient via wheelchair to waiting truck in stable condition

## 2019-07-06 NOTE — Progress Notes (Signed)
0820-0825 Checked with pt to see if any questions re ed done Saturday. Pt stated he will let me know after reading diet if any questions. Pt stated he walked this weekend and has already walked this morning.  Pt for ECHO and tentative d/c. Graylon Good RN BSN 07/06/2019 8:27 AM

## 2019-07-06 NOTE — Discharge Instructions (Signed)

## 2019-07-08 MED FILL — Heparin Sodium (Porcine) Inj 1000 Unit/ML: INTRAMUSCULAR | Qty: 20 | Status: AC

## 2019-07-08 MED FILL — Mannitol IV Soln 20%: INTRAVENOUS | Qty: 500 | Status: AC

## 2019-07-08 MED FILL — Sodium Bicarbonate IV Soln 8.4%: INTRAVENOUS | Qty: 50 | Status: AC

## 2019-07-08 MED FILL — Sodium Chloride IV Soln 0.9%: INTRAVENOUS | Qty: 5000 | Status: AC

## 2019-07-08 MED FILL — Lidocaine HCl Local Soln Prefilled Syringe 100 MG/5ML (2%): INTRAMUSCULAR | Qty: 10 | Status: AC

## 2019-07-08 MED FILL — Albumin, Human Inj 5%: INTRAVENOUS | Qty: 250 | Status: AC

## 2019-07-08 MED FILL — Electrolyte-R (PH 7.4) Solution: INTRAVENOUS | Qty: 6000 | Status: AC

## 2019-07-14 NOTE — Anesthesia Postprocedure Evaluation (Signed)
Anesthesia Post Note  Patient: Kelten Enochs  Procedure(s) Performed: CORONARY ARTERY BYPASS GRAFTING (CABG) x 4, LIMA TO DIAGONAL 1, SVG TO OML, SVG TO RAMUS, SVG TO PDA (N/A Chest) TRANSESOPHAGEAL ECHOCARDIOGRAM (TEE) (N/A ) ENDOVEIN HARVEST OF GREATER SAPHENOUS VEIN (Bilateral )     Patient location during evaluation: SICU Anesthesia Type: General Level of consciousness: sedated Pain management: pain level controlled Vital Signs Assessment: post-procedure vital signs reviewed and stable Respiratory status: patient remains intubated per anesthesia plan Cardiovascular status: stable Postop Assessment: no apparent nausea or vomiting Anesthetic complications: no   No complications documented.  Last Vitals:  Vitals:   07/06/19 0727 07/06/19 1040  BP: 101/63 102/75  Pulse: 84 (!) 29  Resp: 13 19  Temp:  36.8 C  SpO2: 100% 99%    Last Pain:  Vitals:   07/06/19 1040  TempSrc: Oral  PainSc: 0-No pain                 Zo Loudon

## 2019-07-15 ENCOUNTER — Encounter: Payer: Medicare Other | Attending: Cardiovascular Disease

## 2019-07-15 ENCOUNTER — Other Ambulatory Visit: Payer: Self-pay

## 2019-07-15 DIAGNOSIS — Z951 Presence of aortocoronary bypass graft: Secondary | ICD-10-CM | POA: Insufficient documentation

## 2019-07-15 DIAGNOSIS — Z7984 Long term (current) use of oral hypoglycemic drugs: Secondary | ICD-10-CM | POA: Insufficient documentation

## 2019-07-15 DIAGNOSIS — E119 Type 2 diabetes mellitus without complications: Secondary | ICD-10-CM | POA: Insufficient documentation

## 2019-07-15 DIAGNOSIS — E669 Obesity, unspecified: Secondary | ICD-10-CM | POA: Insufficient documentation

## 2019-07-15 DIAGNOSIS — E7800 Pure hypercholesterolemia, unspecified: Secondary | ICD-10-CM | POA: Insufficient documentation

## 2019-07-15 DIAGNOSIS — Z87891 Personal history of nicotine dependence: Secondary | ICD-10-CM | POA: Insufficient documentation

## 2019-07-15 DIAGNOSIS — Z6832 Body mass index (BMI) 32.0-32.9, adult: Secondary | ICD-10-CM | POA: Insufficient documentation

## 2019-07-15 DIAGNOSIS — Z79899 Other long term (current) drug therapy: Secondary | ICD-10-CM | POA: Insufficient documentation

## 2019-07-15 DIAGNOSIS — I350 Nonrheumatic aortic (valve) stenosis: Secondary | ICD-10-CM | POA: Insufficient documentation

## 2019-07-15 DIAGNOSIS — I10 Essential (primary) hypertension: Secondary | ICD-10-CM | POA: Insufficient documentation

## 2019-07-15 DIAGNOSIS — Z85820 Personal history of malignant melanoma of skin: Secondary | ICD-10-CM | POA: Insufficient documentation

## 2019-07-15 DIAGNOSIS — Z7982 Long term (current) use of aspirin: Secondary | ICD-10-CM | POA: Insufficient documentation

## 2019-07-15 DIAGNOSIS — I2510 Atherosclerotic heart disease of native coronary artery without angina pectoris: Secondary | ICD-10-CM | POA: Insufficient documentation

## 2019-07-15 NOTE — Progress Notes (Signed)
Virtual Visit completed. Patient informed on EP and RD appointment and 6 Minute walk test. Patient also informed of patient health questionnaires on My Chart. Patient Verbalizes understanding. Visit diagnosis can be found in CH[facility]6/22/2021.

## 2019-07-16 NOTE — Progress Notes (Signed)
Cardiology Clinic Note   Patient Name: Phillip [name] Date of Encounter: 07/17/2019  Primary Care Provider:  [name], MD Primary Cardiologist:  Skeet Latch, MD  Patient Profile    Phillip [name] 69 year old male presents to the clinic today for follow-up status post CABG x4 (LIMA to diagonal, SVG to OM, SVG to ramus, SVG to PDA) 06/30/2019  Past Medical History    Past Medical History:  Diagnosis Date  . Aortic stenosis    mild to moderate AS 05/2019 echo   . CAD in native artery 06/09/2019  . Diabetes mellitus type 2 in obese ([place]) 06/09/2019  . Essential hypertension 06/09/2019  . Melanoma ([place])    back  . Pure hypercholesterolemia 06/09/2019   Past Surgical History:  Procedure Laterality Date  . CORONARY ARTERY BYPASS GRAFT N/A 06/30/2019   Procedure: CORONARY ARTERY BYPASS GRAFTING (CABG) x 4, LIMA TO DIAGONAL 1, SVG TO OML, SVG TO RAMUS, SVG TO PDA;  Surgeon: Prescott Gum, Collier Salina, MD;  Location: [place];  Service: Open Heart Surgery;  Laterality: N/A;  . ENDOVEIN HARVEST OF GREATER SAPHENOUS VEIN Bilateral 06/30/2019   Procedure: ENDOVEIN HARVEST OF GREATER SAPHENOUS VEIN;  Surgeon: Ivin Poot, MD;  Location: [place];  Service: Open Heart Surgery;  Laterality: Bilateral;  . MELANOMA EXCISION     back  . TEE WITHOUT CARDIOVERSION N/A 06/30/2019   Procedure: TRANSESOPHAGEAL ECHOCARDIOGRAM (TEE);  Surgeon: Prescott Gum, Collier Salina, MD;  Location: [place];  Service: Open Heart Surgery;  Laterality: N/A;  . THYROGLOSSAL DUCT CYST      Allergies  No Known Allergies  History of Present Illness    Mr. Phillip [name] has a PMH of CAD, HTN, HLD, and diabetes.  He presented to see Dr. Oval Linsey for a second opinion on CAD.  Initially he had symptoms that he thought were attributed to hernia.  However his PCP was concerned that it may be ischemia.  He had an abnormal stress echo at [facility] 05/25/2019.  He was able to achieve 7 METS and had chest pain with exercise.  He developed  hypokinesis in the LAD region and LV dilation with stress.  He was referred to [facility] where his high-sensitivity troponins were mildly elevated and flat.  His LDL was 171 and hemoglobin A1c was 7.8.  He underwent cardiac catheterization which showed three-vessel obstructive CAD.  His catheterization also showed LAD occlusion with left to left collaterals.  He was noted to have occlusive disease in OM1, OM 2, mid to distal RCA and his PDA.  He also had collaterals right to left.  An echocardiogram showed an LVEF of 50%.  He indicated that his father had also need bypass surgery and a aortic valve replacement.  He was frustrated because he had remained active and was trying to have a healthier diet however, he had not been able to avoid  cardiac issues.  He left AMA from [facility] and was not started on any medications.  He had been taking aspirin and amlodipine.  When he saw Dr. Oval Linsey on 06/09/2019 he was feeling well.  He continued to have activity intolerance with activities such as bending over.  1 year ago he indicated that he was able to play tennis but was not able to play at that time.  He denied chest pain and pressure, lower extremity edema, orthopnea, and PND.  He was referred to CVTS and underwent CABG x4 on 06/30/2019.  He tolerated the procedure well.  He was extubated postop day 1 and transferred to telemetry on postop day 3.  On 6/27 he had a syncopal episode while using the bathroom.  Nursing was at his side and were able to catch him during this event.  He continued to do well and was discharged 07/06/2019.  He presents to the clinic today for follow-up and states he feels well.  States that he never had chest pain or EKG changes throughout his course of treatment.  He indicates that he still has his chest tube sutures in place.  He has not noticed any drainage, fever, or bleeding.  He comments that he is following his sternal precautions.  He is concerned about his  medications and states that he, until this point had not needed any prescription medications.  He states that his breathing is much better since his bypass and he has been monitoring his diet.  He asks what the best type of diet is to follow.  His medications were explained.  He was encouraged to continue to follow his sternal precautions, eat a Mediterranean-style low-sodium carb modified diet, slowly increase physical activity, and his chest tube sutures were removed.  Chest tube sites are healing well, no drainage.  He was instructed to maintain his clean dry dressings for the next week and then leave his chest tube sites open to air.  He is also concerned about not having a primary care provider.  He states that he was provided with a hotline to acquire a PCP and he will have a 90-day wait.  I have asked him to contact our office with any medical concerns that he has.  I will have him follow-up CVTS and Dr. Oval Linsey as scheduled.  Today he denies chest pain, shortness of breath, lower extremity edema, fatigue, palpitations, melena, hematuria, hemoptysis, diaphoresis, weakness, presyncope, syncope, orthopnea, and PND.   Home Medications    Prior to Admission medications   Medication Sig Start Date End Date Taking? Authorizing Provider  amiodarone (PACERONE) 200 MG tablet Take 1 tablet (200 mg total) by mouth daily. 07/06/19   Elgie Collard, PA-C  aspirin EC 325 MG EC tablet Take 1 tablet (325 mg total) by mouth daily. 07/06/19   Elgie Collard, PA-C  carvedilol (COREG) 6.25 MG tablet Take 1 tablet (6.25 mg total) by mouth 2 (two) times daily with a meal. 07/06/19   Elgie Collard, PA-C  furosemide (LASIX) 40 MG tablet Take 1 tablet (40 mg total) by mouth daily. Patient not taking: Reported on 07/15/2019 07/06/19   Elgie Collard, PA-C  metFORMIN (GLUCOPHAGE) 500 MG tablet Take 1 tablet (500 mg total) by mouth 2 (two) times daily with a meal. 07/06/19   Elgie Collard, PA-C  Multiple Vitamins-Minerals  (MULTIVITAMIN WITH MINERALS) tablet Take 1 tablet by mouth daily.    [provider]  oxyCODONE (OXY IR/ROXICODONE) 5 MG immediate release tablet Take 1 tablet (5 mg total) by mouth every 6 (six) hours as needed for severe pain. Patient not taking: Reported on 07/15/2019 07/06/19   Elgie Collard, PA-C  potassium chloride SA (KLOR-CON) 20 MEQ tablet Take 1 tablet (20 mEq total) by mouth daily. Patient not taking: Reported on 07/15/2019 07/06/19   Elgie Collard, PA-C  rosuvastatin (CRESTOR) 40 MG tablet Take 1 tablet (40 mg total) by mouth at bedtime. 06/09/19 09/07/19  Skeet Latch, MD    Family History    Family History  Problem Relation Age of Onset  . CAD Father   .  Valvular heart disease Father   . Heart disease Maternal Uncle   . Heart disease Maternal Grandfather    He indicated that the status of his father is unknown. He indicated that the status of his maternal grandfather is unknown. He indicated that the status of his maternal uncle is unknown.  Social History    Social History   Socioeconomic History  . Marital status: Single    Spouse name: Not on file  . Number of children: Not on file  . Years of education: Not on file  . Highest education level: Not on file  Occupational History  . Not on file  Tobacco Use  . Smoking status: Former Smoker    Packs/day: 2.00    Years: 15.00    Pack years: 30.00    Types: Cigarettes    Quit date: 1979    Years since quitting: 42.5  . Smokeless tobacco: Never Used  Vaping Use  . Vaping Use: Never used  Substance and Sexual Activity  . Alcohol use: Yes    Alcohol/week: 6.0 - 8.0 standard drinks    Types: 6 - 8 Glasses of wine per week    Comment: wine   . Drug use: Not on file  . Sexual activity: Not on file  Other Topics Concern  . Not on file  Social History Narrative  . Not on file   Social Determinants of Health   Financial Resource Strain:   . Difficulty of Paying Living Expenses:   Food Insecurity:    . Worried About Charity fundraiser in the Last Year:   . Arboriculturist in the Last Year:   Transportation Needs:   . Film/video editor (Medical):   Marland Kitchen Lack of Transportation (Non-Medical):   Physical Activity:   . Days of Exercise per Week:   . Minutes of Exercise per Session:   Stress:   . Feeling of Stress :   Social Connections:   . Frequency of Communication with Friends and Family:   . Frequency of Social Gatherings with Friends and Family:   . Attends Religious Services:   . Active Member of Clubs or Organizations:   . Attends Archivist Meetings:   Marland Kitchen Marital Status:   Intimate Partner Violence:   . Fear of Current or Ex-Partner:   . Emotionally Abused:   Marland Kitchen Physically Abused:   . Sexually Abused:      Review of Systems    General:  No chills, fever, night sweats or weight changes.  Cardiovascular:  No chest pain, dyspnea on exertion, edema, orthopnea, palpitations, paroxysmal nocturnal dyspnea. Dermatological: No rash, lesions/masses Respiratory: No cough, dyspnea Urologic: No hematuria, dysuria Abdominal:   No nausea, vomiting, diarrhea, bright red blood per rectum, melena, or hematemesis Neurologic:  No visual changes, wkns, changes in mental status. All other systems reviewed and are otherwise negative except as noted above.  Physical Exam    VS:  BP 126/74   Pulse 74   Ht 5\' 7"  (1.702 m)   Wt 210 lb 3.2 oz (95.3 kg)   SpO2 98%   BMI 32.92 kg/m  , BMI Body mass index is 32.92 kg/m. GEN: Well nourished, well developed, in no acute distress. HEENT: normal. Neck: Supple, no JVD, carotid bruits, or masses. Cardiac: RRR, no murmurs, rubs, or gallops. No clubbing, cyanosis, edema.  Radials/DP/PT 2+ and equal bilaterally.  Respiratory:  Respirations regular and unlabored, clear to auscultation bilaterally. GI: Soft, nontender, nondistended, BS +  x 4. MS: no deformity or atrophy. Skin: warm and dry, no rash. Neuro:  Strength and sensation are  intact. Psych: Normal affect.  Accessory Clinical Findings    ECG personally reviewed by me today-normal sinus rhythm right bundle branch block left anterior fascicular block 74 bpm anterior septal infarct undetermined age- No acute changes  EKG 07/01/2019 Sinus tachycardia anterior septal infarct undetermined age 80 bpm  Echocardiogram 07/06/2019 IMPRESSIONS    1. Trivial pericardial effusion.   FINDINGS  Left Ventricle: The left ventricle has grossly normal function.   Pericardium: Trivial pericardial effusion. Trivial pericardial effusion is  present.   Mitral Valve: The mitral valve is grossly normal.   Tricuspid Valve: The tricuspid valve is grossly normal.   Aortic Valve: AV is thickened, calcified with restricted motion. Echo  limited.   Venous: IVC is small at 9 mm. The inferior vena cava is normal in size  with greater than 50% respiratory variability, suggesting right atrial  pressure of 3 mmHg.   Cardiac catheterization 05/26/2019 Coronary angiogram via right radial for high risk stress test. LM  engaged, mild disease. LAD has severe calcium and occluded ostial  segment. LCx gives off high OM1 (obstructive) with obstructive disease  mid and obstructive OM2. RCA engaged, obstructive prox, mid and distal disease. L-L and R-L collaterals present. AV not crossed.  Assessment & Plan   1.  Coronary artery disease/status post CABG x4-no chest pain today.  Sternal incision/chest tube sites clean dry intact and healing well.  Underwent CABG x4 06/30/2019 (LIMA to diagonal, SVG to OM, SVG to ramus, SVG to PDA) tolerated well and was discharged on 07/06/2019 Continue amiodarone, aspirin, carvedilol, metformin, rosuvastatin Sternal precautions Heart healthy low-sodium diet-salty 6 given Increase physical activity as instructed by CVTS  Hyperlipidemia-on statin Continue rosuvastatin Heart healthy low-sodium high-fiber diet Increase physical activity as instructed  by CVTS  Aortic stenosis-patient reported mild-moderate.  Postoperative limited echo showed AV is thickened, calcified with restricted motion. Echo  limited.  We will request outside echo results.  Essential hypertension-BP today 126/74.  Was noted to have a syncopal episode in hospital post CABG.  His diuretic was discontinued at that time. Continue  carvedilol Heart healthy low-sodium diet-salty 6 given Increase physical activity as tolerated  Diabetes mellitus-A1c 7.8  Continue Metformin Followed by PCP  Disposition: Follow-up with Dr. Oval Linsey in 3 months.  Jossie Ng. Mane Consolo NP-C    07/17/2019, 3:30 PM [address] Office [phone] Fax [phone]

## 2019-07-17 ENCOUNTER — Other Ambulatory Visit: Payer: Self-pay

## 2019-07-17 ENCOUNTER — Encounter: Payer: Self-pay | Admitting: General Practice

## 2019-07-17 ENCOUNTER — Ambulatory Visit (INDEPENDENT_AMBULATORY_CARE_PROVIDER_SITE_OTHER): Payer: Medicare Other | Admitting: General Practice

## 2019-07-17 VITALS — BP 126/74 | HR 74 | Ht 67.0 in | Wt 210.2 lb

## 2019-07-17 DIAGNOSIS — E7800 Pure hypercholesterolemia, unspecified: Secondary | ICD-10-CM | POA: Diagnosis not present

## 2019-07-17 DIAGNOSIS — I2510 Atherosclerotic heart disease of native coronary artery without angina pectoris: Secondary | ICD-10-CM | POA: Diagnosis not present

## 2019-07-17 DIAGNOSIS — E669 Obesity, unspecified: Secondary | ICD-10-CM

## 2019-07-17 DIAGNOSIS — I350 Nonrheumatic aortic (valve) stenosis: Secondary | ICD-10-CM | POA: Diagnosis not present

## 2019-07-17 DIAGNOSIS — E1169 Type 2 diabetes mellitus with other specified complication: Secondary | ICD-10-CM

## 2019-07-17 DIAGNOSIS — I10 Essential (primary) hypertension: Secondary | ICD-10-CM | POA: Diagnosis not present

## 2019-07-17 NOTE — Patient Instructions (Addendum)
Medication Instructions:  The current medical regimen is effective;  continue present plan and medications as directed. Please refer to the Current Medication list given to you today. *If you need a refill on your cardiac medications before your next appointment, please call your pharmacy*  Special Instructions KEEP DRESSING CLEAN AND DRY. NEW DRESSING DAILY. CHANGE WHEN SOILED AND AFTER SHOWERING.  PLEASE READ AND FOLLOW SALTY 6-ATTACHED  FOLLOW STERNAL PRECAUTIONS  Follow-Up: Your next appointment:  [place] [facility] AND DR [place]    At [facility], you and your health needs are our priority.  As part of our continuing mission to provide you with exceptional heart care, we have created designated Provider Care Teams.  These Care Teams include your primary Cardiologist (physician) and Advanced Practice Providers (APPs -  Physician Assistants and Nurse Practitioners) who all work together to provide you with the care you need, when you need it.

## 2019-07-23 LAB — ECHO INTRAOPERATIVE TEE
AV Mean grad: 15 mmHg
Ao-asc: 3.4 cm
Height: 67 in
LVOT diameter: 23 mm
Mean grad: 0 mmHg
STJ: 2.9 cm
Sinus: 3.5 cm
Weight: 3425.07 oz

## 2019-08-03 ENCOUNTER — Encounter: Payer: Medicare Other | Admitting: *Deleted

## 2019-08-03 ENCOUNTER — Other Ambulatory Visit: Payer: Self-pay

## 2019-08-03 VITALS — Ht 68.2 in | Wt 212.4 lb

## 2019-08-03 DIAGNOSIS — I2510 Atherosclerotic heart disease of native coronary artery without angina pectoris: Secondary | ICD-10-CM | POA: Diagnosis not present

## 2019-08-03 DIAGNOSIS — E119 Type 2 diabetes mellitus without complications: Secondary | ICD-10-CM | POA: Diagnosis not present

## 2019-08-03 DIAGNOSIS — Z87891 Personal history of nicotine dependence: Secondary | ICD-10-CM | POA: Diagnosis not present

## 2019-08-03 DIAGNOSIS — Z951 Presence of aortocoronary bypass graft: Secondary | ICD-10-CM

## 2019-08-03 DIAGNOSIS — Z85820 Personal history of malignant melanoma of skin: Secondary | ICD-10-CM | POA: Diagnosis not present

## 2019-08-03 DIAGNOSIS — Z7982 Long term (current) use of aspirin: Secondary | ICD-10-CM | POA: Diagnosis not present

## 2019-08-03 DIAGNOSIS — E7800 Pure hypercholesterolemia, unspecified: Secondary | ICD-10-CM | POA: Diagnosis not present

## 2019-08-03 DIAGNOSIS — I350 Nonrheumatic aortic (valve) stenosis: Secondary | ICD-10-CM | POA: Diagnosis not present

## 2019-08-03 DIAGNOSIS — I10 Essential (primary) hypertension: Secondary | ICD-10-CM | POA: Diagnosis not present

## 2019-08-03 DIAGNOSIS — E669 Obesity, unspecified: Secondary | ICD-10-CM | POA: Diagnosis not present

## 2019-08-03 DIAGNOSIS — Z79899 Other long term (current) drug therapy: Secondary | ICD-10-CM | POA: Diagnosis not present

## 2019-08-03 DIAGNOSIS — Z6832 Body mass index (BMI) 32.0-32.9, adult: Secondary | ICD-10-CM | POA: Diagnosis not present

## 2019-08-03 DIAGNOSIS — Z7984 Long term (current) use of oral hypoglycemic drugs: Secondary | ICD-10-CM | POA: Diagnosis not present

## 2019-08-03 NOTE — Patient Instructions (Signed)
Patient Instructions  Patient Details  Name: Phillip [name] MRN: [id number] Date of Birth: [birth year] Referring Provider:  Skeet Latch, MD  Below are your personal goals for exercise, nutrition, and risk factors. Our goal is to help you stay on track towards obtaining and maintaining these goals. We will be discussing your progress on these goals with you throughout the program.  Initial Exercise Prescription:  Initial Exercise Prescription - 08/03/19 0900      Date of Initial Exercise RX and Referring Provider   Date 08/03/19    Referring Provider Skeet Latch MD      Treadmill   MPH 2.5    Grade 1    Minutes 15    METs 3.26      Recumbant Bike   Level 3    RPM 50    Watts 30    Minutes 15    METs 3      NuStep   Level 3    SPM 80    Minutes 15    METs 3      REL-XR   Level 2    Speed 50    Minutes 15    METs 3      Prescription Details   Frequency (times per week) 2    Duration Progress to 30 minutes of continuous aerobic without signs/symptoms of physical distress      Intensity   THRR 40-80% of Max Heartrate 102-135    Ratings of Perceived Exertion 11-13    Perceived Dyspnea 0-4      Progression   Progression Continue to progress workloads to maintain intensity without signs/symptoms of physical distress.      Resistance Training   Training Prescription Yes    Weight 5 lb    Reps 10-15           Exercise Goals: Frequency: Be able to perform aerobic exercise two to three times per week in program working toward 2-5 days per week of home exercise.  Intensity: Work with a perceived exertion of 11 (fairly light) - 15 (hard) while following your exercise prescription.  We will make changes to your prescription with you as you progress through the program.   Duration: Be able to do 30 to 45 minutes of continuous aerobic exercise in addition to a 5 minute warm-up and a 5 minute cool-down routine.   Nutrition Goals: Your personal nutrition  goals will be established when you do your nutrition analysis with the dietician.  The following are general nutrition guidelines to follow: Cholesterol < 200mg /day Sodium < 1500mg /day Fiber: Men over 50 yrs - 30 grams per day  Personal Goals:  Personal Goals and Risk Factors at Admission - 08/03/19 0942      Core Components/Risk Factors/Patient Goals on Admission    Weight Management Yes;Weight Loss;Obesity    Intervention Weight Management: Develop a combined nutrition and exercise program designed to reach desired caloric intake, while maintaining appropriate intake of nutrient and fiber, sodium and fats, and appropriate energy expenditure required for the weight goal.;Weight Management: Provide education and appropriate resources to help participant work on and attain dietary goals.;Weight Management/Obesity: Establish reasonable short term and long term weight goals.;Obesity: Provide education and appropriate resources to help participant work on and attain dietary goals.    Admit Weight 212 lb 6.4 oz (96.3 kg)    Goal Weight: Short Term 207 lb (93.9 kg)    Goal Weight: Long Term 200 lb (90.7 kg)    Expected Outcomes Short  Term: Continue to assess and modify interventions until short term weight is achieved;Long Term: Adherence to nutrition and physical activity/exercise program aimed toward attainment of established weight goal;Weight Loss: Understanding of general recommendations for a balanced deficit meal plan, which promotes 1-2 lb weight loss per week and includes a negative energy balance of [phone] kcal/d;Understanding recommendations for meals to include 15-35% energy as protein, 25-35% energy from fat, 35-60% energy from carbohydrates, less than 200mg  of dietary cholesterol, 20-35 gm of total fiber daily    Diabetes Yes    Intervention Provide education about signs/symptoms and action to take for hypo/hyperglycemia.;Provide education about proper nutrition, including hydration, and  aerobic/resistive exercise prescription along with prescribed medications to achieve blood glucose in normal ranges: Fasting glucose 65-99 mg/dL    Expected Outcomes Long Term: Attainment of HbA1C < 7%.;Short Term: Participant verbalizes understanding of the signs/symptoms and immediate care of hyper/hypoglycemia, proper foot care and importance of medication, aerobic/resistive exercise and nutrition plan for blood glucose control.    Hypertension Yes    Intervention Provide education on lifestyle modifcations including regular physical activity/exercise, weight management, moderate sodium restriction and increased consumption of fresh fruit, vegetables, and low fat dairy, alcohol moderation, and smoking cessation.;Monitor prescription use compliance.    Expected Outcomes Short Term: Continued assessment and intervention until BP is < 140/42mm HG in hypertensive participants. < 130/13mm HG in hypertensive participants with diabetes, heart failure or chronic kidney disease.;Long Term: Maintenance of blood pressure at goal levels.    Lipids Yes    Intervention Provide education and support for participant on nutrition & aerobic/resistive exercise along with prescribed medications to achieve LDL 70mg , HDL >40mg .    Expected Outcomes Short Term: Participant states understanding of desired cholesterol values and is compliant with medications prescribed. Participant is following exercise prescription and nutrition guidelines.;Long Term: Cholesterol controlled with medications as prescribed, with individualized exercise RX and with personalized nutrition plan. Value goals: LDL < 70mg , HDL > 40 mg.           Tobacco Use Initial Evaluation: Social History   Tobacco Use  Smoking Status Former Smoker  . Packs/day: 2.00  . Years: 15.00  . Pack years: 30.00  . Types: Cigarettes  . Quit date: 14  . Years since quitting: 42.5  Smokeless Tobacco Never Used    Exercise Goals and Review:  Exercise Goals     Row Name 08/03/19 [phone]             Exercise Goals   Increase Physical Activity Yes       Intervention Develop an individualized exercise prescription for aerobic and resistive training based on initial evaluation findings, risk stratification, comorbidities and participant's personal goals.;Provide advice, education, support and counseling about physical activity/exercise needs.       Expected Outcomes Short Term: Attend rehab on a regular basis to increase amount of physical activity.;Long Term: Add in home exercise to make exercise part of routine and to increase amount of physical activity.;Long Term: Exercising regularly at least 3-5 days a week.       Increase Strength and Stamina Yes       Intervention Provide advice, education, support and counseling about physical activity/exercise needs.;Develop an individualized exercise prescription for aerobic and resistive training based on initial evaluation findings, risk stratification, comorbidities and participant's personal goals.       Expected Outcomes Short Term: Increase workloads from initial exercise prescription for resistance, speed, and METs.;Short Term: Perform resistance training exercises routinely during rehab and add  in resistance training at home;Long Term: Improve cardiorespiratory fitness, muscular endurance and strength as measured by increased METs and functional capacity (6MWT)       Able to understand and use rate of perceived exertion (RPE) scale Yes       Intervention Provide education and explanation on how to use RPE scale       Expected Outcomes Short Term: Able to use RPE daily in rehab to express subjective intensity level;Long Term:  Able to use RPE to guide intensity level when exercising independently       Able to understand and use Dyspnea scale Yes       Intervention Provide education and explanation on how to use Dyspnea scale       Expected Outcomes Short Term: Able to use Dyspnea scale daily in rehab to  express subjective sense of shortness of breath during exertion;Long Term: Able to use Dyspnea scale to guide intensity level when exercising independently       Knowledge and understanding of Target Heart Rate Range (THRR) Yes       Intervention Provide education and explanation of THRR including how the numbers were predicted and where they are located for reference       Expected Outcomes Short Term: Able to state/look up THRR;Short Term: Able to use daily as guideline for intensity in rehab;Long Term: Able to use THRR to govern intensity when exercising independently       Able to check pulse independently Yes       Intervention Provide education and demonstration on how to check pulse in carotid and radial arteries.       Expected Outcomes Short Term: Able to explain why pulse checking is important during independent exercise;Long Term: Able to check pulse independently and accurately       Understanding of Exercise Prescription Yes       Intervention Provide education, explanation, and written materials on patient's individual exercise prescription       Expected Outcomes Short Term: Able to explain program exercise prescription;Long Term: Able to explain home exercise prescription to exercise independently              Copy of goals given to participant.

## 2019-08-03 NOTE — Progress Notes (Signed)
Cardiac Individual Treatment Plan  Patient Details  Name: Phillip [name] MRN: [id number] Date of Birth: 10-Mar-1950 Referring Provider:     Cardiac Rehab from 08/03/2019 in [facility] Cardiac and Pulmonary Rehab  Referring Provider Skeet Latch MD      Initial Encounter Date:    Cardiac Rehab from 08/03/2019 in [facility] Cardiac and Pulmonary Rehab  Date 08/03/19      Visit Diagnosis: S/P CABG x 4  Patient's Home Medications on Admission:  Current Outpatient Medications:  .  amiodarone (PACERONE) 200 MG tablet, Take 1 tablet (200 mg total) by mouth daily., Disp: 60 tablet, Rfl: 1 .  aspirin EC 325 MG EC tablet, Take 1 tablet (325 mg total) by mouth daily., Disp: 30 tablet, Rfl: 0 .  carvedilol (COREG) 6.25 MG tablet, Take 1 tablet (6.25 mg total) by mouth 2 (two) times daily with a meal., Disp: 60 tablet, Rfl: 1 .  metFORMIN (GLUCOPHAGE) 500 MG tablet, Take 1 tablet (500 mg total) by mouth 2 (two) times daily with a meal., Disp: 60 tablet, Rfl: 1 .  Multiple Vitamins-Minerals (MULTIVITAMIN WITH MINERALS) tablet, Take 1 tablet by mouth daily., Disp: , Rfl:  .  rosuvastatin (CRESTOR) 40 MG tablet, Take 1 tablet (40 mg total) by mouth at bedtime., Disp: 90 tablet, Rfl: 1  Past Medical History: Past Medical History:  Diagnosis Date  . Aortic stenosis    mild to moderate AS 05/2019 echo   . CAD in native artery 06/09/2019  . Diabetes mellitus type 2 in obese ([place]) 06/09/2019  . Essential hypertension 06/09/2019  . Melanoma ([place])    back  . Pure hypercholesterolemia 06/09/2019    Tobacco Use: Social History   Tobacco Use  Smoking Status Former Smoker  . Packs/day: 2.00  . Years: 15.00  . Pack years: 30.00  . Types: Cigarettes  . Quit date: 14  . Years since quitting: 42.5  Smokeless Tobacco Never Used    Labs: Recent Chemical engineer    Labs for ITP Cardiac and Pulmonary Rehab Latest Ref Rng & Units 07/03/2019 07/04/2019 07/04/2019 07/05/2019 07/06/2019   Hemoglobin A1c 4.8 - 5.6  % - - - - -   PHART 7.35 - 7.45 - - - - -   PCO2ART 32 - 48 mmHg - - - - -   HCO3 20.0 - 28.0 mmol/L - - - - -   TCO2 22 - 32 mmol/L - - - - -   ACIDBASEDEF 0.0 - 2.0 mmol/L - - - - -   O2SAT % 58.4 39.3 46.7 55.7 53.1       Exercise Target Goals: Exercise Program Goal: Individual exercise prescription set using results from initial 6 min walk test and THRR while considering  patient's activity barriers and safety.   Exercise Prescription Goal: Initial exercise prescription builds to 30-45 minutes a day of aerobic activity, 2-3 days per week.  Home exercise guidelines will be given to patient during program as part of exercise prescription that the participant will acknowledge.   Education: Aerobic Exercise & Resistance Training: - Gives group verbal and written instruction on the various components of exercise. Focuses on aerobic and resistive training programs and the benefits of this training and how to safely progress through these programs..   Education: Exercise & Equipment Safety: - Individual verbal instruction and demonstration of equipment use and safety with use of the equipment.   Cardiac Rehab from 08/03/2019 in [facility] Cardiac and Pulmonary Rehab  Date 08/03/19  Educator [facility]  Instruction Review Code 1- Verbalizes Understanding      Education: Exercise Physiology & General Exercise Guidelines: - Group verbal and written instruction with models to review the exercise physiology of the cardiovascular system and associated critical values. Provides general exercise guidelines with specific guidelines to those with heart or lung disease.    Education: Flexibility, Balance, Mind/Body Relaxation: Provides group verbal/written instruction on the benefits of flexibility and balance training, including mind/body exercise modes such as yoga, pilates and tai chi.  Demonstration and skill practice provided.   Activity Barriers & Risk Stratification:  Activity Barriers & Cardiac  Risk Stratification - 08/03/19 0937      Activity Barriers & Cardiac Risk Stratification   Activity Barriers Other (comment)    Comments sternal restrictions only    Cardiac Risk Stratification High           6 Minute Walk:  6 Minute Walk    Row Name 08/03/19 0937         6 Minute Walk   Phase Initial     Distance 1395 feet     Walk Time 6 minutes     # of Rest Breaks 0     MPH 2.64     METS 2.94     RPE 9     VO2 Peak 10.28     Symptoms No     Resting HR 68 bpm     Resting BP 124/66     Resting Oxygen Saturation  96 %     Exercise Oxygen Saturation  during 6 min walk 96 %     Max Ex. HR 103 bpm     Max Ex. BP 130/68     2 Minute Post BP 126/64            Oxygen Initial Assessment:   Oxygen Re-Evaluation:   Oxygen Discharge (Final Oxygen Re-Evaluation):   Initial Exercise Prescription:  Initial Exercise Prescription - 08/03/19 0900      Date of Initial Exercise RX and Referring Provider   Date 08/03/19    Referring Provider Skeet Latch MD      Treadmill   MPH 2.5    Grade 1    Minutes 15    METs 3.26      Recumbant Bike   Level 3    RPM 50    Watts 30    Minutes 15    METs 3      NuStep   Level 3    SPM 80    Minutes 15    METs 3      REL-XR   Level 2    Speed 50    Minutes 15    METs 3      Prescription Details   Frequency (times per week) 2    Duration Progress to 30 minutes of continuous aerobic without signs/symptoms of physical distress      Intensity   THRR 40-80% of Max Heartrate 102-135    Ratings of Perceived Exertion 11-13    Perceived Dyspnea 0-4      Progression   Progression Continue to progress workloads to maintain intensity without signs/symptoms of physical distress.      Resistance Training   Training Prescription Yes    Weight 5 lb    Reps 10-15           Perform Capillary Blood Glucose checks as needed.  Exercise Prescription Changes:  Exercise Prescription Changes    Row Name 08/03/19  0900             Response to Exercise   Blood Pressure (Admit) 124/66       Blood Pressure (Exercise) 130/68       Blood Pressure (Exit) 126/64       Heart Rate (Admit) 68 bpm       Heart Rate (Exercise) 103 bpm       Heart Rate (Exit) 66 bpm       Oxygen Saturation (Admit) 96 %       Oxygen Saturation (Exercise) 96 %       Rating of Perceived Exertion (Exercise) 9       Symptoms none       Comments walk test results              Exercise Comments:   Exercise Goals and Review:  Exercise Goals    Row Name 08/03/19 0939             Exercise Goals   Increase Physical Activity Yes       Intervention Develop an individualized exercise prescription for aerobic and resistive training based on initial evaluation findings, risk stratification, comorbidities and participant's personal goals.;Provide advice, education, support and counseling about physical activity/exercise needs.       Expected Outcomes Short Term: Attend rehab on a regular basis to increase amount of physical activity.;Long Term: Add in home exercise to make exercise part of routine and to increase amount of physical activity.;Long Term: Exercising regularly at least 3-5 days a week.       Increase Strength and Stamina Yes       Intervention Provide advice, education, support and counseling about physical activity/exercise needs.;Develop an individualized exercise prescription for aerobic and resistive training based on initial evaluation findings, risk stratification, comorbidities and participant's personal goals.       Expected Outcomes Short Term: Increase workloads from initial exercise prescription for resistance, speed, and METs.;Short Term: Perform resistance training exercises routinely during rehab and add in resistance training at home;Long Term: Improve cardiorespiratory fitness, muscular endurance and strength as measured by increased METs and functional capacity (6MWT)       Able to understand and use  rate of perceived exertion (RPE) scale Yes       Intervention Provide education and explanation on how to use RPE scale       Expected Outcomes Short Term: Able to use RPE daily in rehab to express subjective intensity level;Long Term:  Able to use RPE to guide intensity level when exercising independently       Able to understand and use Dyspnea scale Yes       Intervention Provide education and explanation on how to use Dyspnea scale       Expected Outcomes Short Term: Able to use Dyspnea scale daily in rehab to express subjective sense of shortness of breath during exertion;Long Term: Able to use Dyspnea scale to guide intensity level when exercising independently       Knowledge and understanding of Target Heart Rate Range (THRR) Yes       Intervention Provide education and explanation of THRR including how the numbers were predicted and where they are located for reference       Expected Outcomes Short Term: Able to state/look up THRR;Short Term: Able to use daily as guideline for intensity in rehab;Long Term: Able to use THRR to govern intensity when exercising independently       Able to check pulse independently  Yes       Intervention Provide education and demonstration on how to check pulse in carotid and radial arteries.       Expected Outcomes Short Term: Able to explain why pulse checking is important during independent exercise;Long Term: Able to check pulse independently and accurately       Understanding of Exercise Prescription Yes       Intervention Provide education, explanation, and written materials on patient's individual exercise prescription       Expected Outcomes Short Term: Able to explain program exercise prescription;Long Term: Able to explain home exercise prescription to exercise independently              Exercise Goals Re-Evaluation :   Discharge Exercise Prescription (Final Exercise Prescription Changes):  Exercise Prescription Changes - 08/03/19 0900       Response to Exercise   Blood Pressure (Admit) 124/66    Blood Pressure (Exercise) 130/68    Blood Pressure (Exit) 126/64    Heart Rate (Admit) 68 bpm    Heart Rate (Exercise) 103 bpm    Heart Rate (Exit) 66 bpm    Oxygen Saturation (Admit) 96 %    Oxygen Saturation (Exercise) 96 %    Rating of Perceived Exertion (Exercise) 9    Symptoms none    Comments walk test results           Nutrition:  Target Goals: Understanding of nutrition guidelines, daily intake of sodium <1532m, cholesterol <2064m calories 30% from fat and 7% or less from saturated fats, daily to have 5 or more servings of fruits and vegetables.  Education: Controlling Sodium/Reading Food Labels -Group verbal and written material supporting the discussion of sodium use in heart healthy nutrition. Review and explanation with models, verbal and written materials for utilization of the food label.   Education: General Nutrition Guidelines/Fats and Fiber: -Group instruction provided by verbal, written material, models and posters to present the general guidelines for heart healthy nutrition. Gives an explanation and review of dietary fats and fiber.   Biometrics:  Pre Biometrics - 08/03/19 0940      Pre Biometrics   Height 5' 8.2" (1.732 m)    Weight 212 lb 6.4 oz (96.3 kg)    BMI (Calculated) 32.12    Single Leg Stand 29.25 seconds            Nutrition Therapy Plan and Nutrition Goals:   Nutrition Assessments:  Nutrition Assessments - 08/03/19 0941      MEDFICTS Scores   Pre Score 3           MEDIFICTS Score Key:          ?70 Need to make dietary changes          40-70 Heart Healthy Diet         ? 40 Therapeutic Level Cholesterol Diet  Nutrition Goals Re-Evaluation:   Nutrition Goals Discharge (Final Nutrition Goals Re-Evaluation):   Psychosocial: Target Goals: Acknowledge presence or absence of significant depression and/or stress, maximize coping skills, provide positive support system.  Participant is able to verbalize types and ability to use techniques and skills needed for reducing stress and depression.   Education: Depression - Provides group verbal and written instruction on the correlation between heart/lung disease and depressed mood, treatment options, and the stigmas associated with seeking treatment.   Education: Sleep Hygiene -Provides group verbal and written instruction about how sleep can affect your health.  Define sleep hygiene, discuss sleep cycles and impact  of sleep habits. Review good sleep hygiene tips.     Education: Stress and Anxiety: - Provides group verbal and written instruction about the health risks of elevated stress and causes of high stress.  Discuss the correlation between heart/lung disease and anxiety and treatment options. Review healthy ways to manage with stress and anxiety.    Initial Review & Psychosocial Screening:  Initial Psych Review & Screening - 07/15/19 1117      Initial Review   Current issues with Current Sleep Concerns      Family Dynamics   Good Support System? Yes    Comments He is not sleeping well due to having some pain from his procedure. He can look to his girlfriend, children and grand children. He runs a custom swimming pool company.      Barriers   Psychosocial barriers to participate in program There are no identifiable barriers or psychosocial needs.;The patient should benefit from training in stress management and relaxation.      Screening Interventions   Interventions Encouraged to exercise;Provide feedback about the scores to participant;Program counselor consult;To provide support and resources with identified psychosocial needs    Expected Outcomes Short Term goal: Utilizing psychosocial counselor, staff and physician to assist with identification of specific Stressors or current issues interfering with healing process. Setting desired goal for each stressor or current issue identified.;Long Term  Goal: Stressors or current issues are controlled or eliminated.;Short Term goal: Identification and review with participant of any Quality of Life or Depression concerns found by scoring the questionnaire.;Long Term goal: The participant improves quality of Life and PHQ9 Scores as seen by post scores and/or verbalization of changes           Quality of Life Scores:   Quality of Life - 08/03/19 0940      Quality of Life   Select Quality of Life      Quality of Life Scores   Health/Function Pre 25.63 %    Socioeconomic Pre 23.62 %    Psych/Spiritual Pre 27.85 %    Family Pre 18.6 %    GLOBAL Pre 24.61 %          Scores of 19 and below usually indicate a poorer quality of life in these areas.  A difference of  2-3 points is a clinically meaningful difference.  A difference of 2-3 points in the total score of the Quality of Life Index has been associated with significant improvement in overall quality of life, self-image, physical symptoms, and general health in studies assessing change in quality of life.  PHQ-9: Recent Review Flowsheet Data    Depression screen [facility] 2/9 08/03/2019   Decreased Interest 0   Down, Depressed, Hopeless 0   PHQ - 2 Score 0   Altered sleeping 2   Tired, decreased energy 0   Change in appetite 0   Feeling bad or failure about yourself  0   Trouble concentrating 0   Moving slowly or fidgety/restless 0   Suicidal thoughts 0   PHQ-9 Score 2   Difficult doing work/chores Not difficult at all     Interpretation of Total Score  Total Score Depression Severity:  1-4 = Minimal depression, 5-9 = Mild depression, 10-14 = Moderate depression, 15-19 = Moderately severe depression, 20-27 = Severe depression   Psychosocial Evaluation and Intervention:  Psychosocial Evaluation - 07/15/19 1119      Psychosocial Evaluation & Interventions   Interventions Encouraged to exercise with the program and follow exercise prescription  Comments He is not sleeping well  due to having some pain from his procedure. He can look to his girlfriend, children and grand children. He runs a custom swimming pool company.    Expected Outcomes Short: Exercise regularly to support mental health and notify staff of any changes. Long: maintain mental health and well being through teaching of rehab or prescribed medications independently.    Continue Psychosocial Services  Follow up required by staff           Psychosocial Re-Evaluation:   Psychosocial Discharge (Final Psychosocial Re-Evaluation):   Vocational Rehabilitation: Provide vocational rehab assistance to qualifying candidates.   Vocational Rehab Evaluation & Intervention:   Education: Education Goals: Education classes will be provided on a variety of topics geared toward better understanding of heart health and risk factor modification. Participant will state understanding/return demonstration of topics presented as noted by education test scores.  Learning Barriers/Preferences:  Learning Barriers/Preferences - 07/15/19 1113      Learning Barriers/Preferences   Learning Barriers None    Learning Preferences None           General Cardiac Education Topics:  AED/CPR: - Group verbal and written instruction with the use of models to demonstrate the basic use of the AED with the basic ABC's of resuscitation.   Anatomy & Physiology of the Heart: - Group verbal and written instruction and models provide basic cardiac anatomy and physiology, with the coronary electrical and arterial systems. Review of Valvular disease and Heart Failure   Cardiac Rehab from 08/03/2019 in [facility] Cardiac and Pulmonary Rehab  Date 08/03/19  Instruction Review Code 3- Needs Reinforcement  [need identified]      Cardiac Procedures: - Group verbal and written instruction to review commonly prescribed medications for heart disease. Reviews the medication, class of the drug, and side effects. Includes the steps to properly  store meds and maintain the prescription regimen. (beta blockers and nitrates)   Cardiac Medications I: - Group verbal and written instruction to review commonly prescribed medications for heart disease. Reviews the medication, class of the drug, and side effects. Includes the steps to properly store meds and maintain the prescription regimen.   Cardiac Medications II: -Group verbal and written instruction to review commonly prescribed medications for heart disease. Reviews the medication, class of the drug, and side effects. (all other drug classes)    Go Sex-Intimacy & Heart Disease, Get SMART - Goal Setting: - Group verbal and written instruction through game format to discuss heart disease and the return to sexual intimacy. Provides group verbal and written material to discuss and apply goal setting through the application of the S.M.A.R.T. Method.   Other Matters of the Heart: - Provides group verbal, written materials and models to describe Stable Angina and Peripheral Artery. Includes description of the disease process and treatment options available to the cardiac patient.   Infection Prevention: - Provides verbal and written material to individual with discussion of infection control including proper hand washing and proper equipment cleaning during exercise session.   Cardiac Rehab from 08/03/2019 in [facility] Cardiac and Pulmonary Rehab  Date 08/03/19  Educator [facility]  Instruction Review Code 1- Verbalizes Understanding      Falls Prevention: - Provides verbal and written material to individual with discussion of falls prevention and safety.   Cardiac Rehab from 08/03/2019 in [facility] Cardiac and Pulmonary Rehab  Date 08/03/19  Educator [facility]  Instruction Review Code 1- Verbalizes Understanding      Other: -Provides group and  verbal instruction on various topics (see comments)   Knowledge Questionnaire Score:  Knowledge Questionnaire Score - 08/03/19 0941      Knowledge  Questionnaire Score   Pre Score 24/26 Education Focus: Angina, [place]           Core Components/Risk Factors/Patient Goals at Admission:  Personal Goals and Risk Factors at Admission - 08/03/19 0942      Core Components/Risk Factors/Patient Goals on Admission    Weight Management Yes;Weight Loss;Obesity    Intervention Weight Management: Develop a combined nutrition and exercise program designed to reach desired caloric intake, while maintaining appropriate intake of nutrient and fiber, sodium and fats, and appropriate energy expenditure required for the weight goal.;Weight Management: Provide education and appropriate resources to help participant work on and attain dietary goals.;Weight Management/Obesity: Establish reasonable short term and long term weight goals.;Obesity: Provide education and appropriate resources to help participant work on and attain dietary goals.    Admit Weight 212 lb 6.4 oz (96.3 kg)    Goal Weight: Short Term 207 lb (93.9 kg)    Goal Weight: Long Term 200 lb (90.7 kg)    Expected Outcomes Short Term: Continue to assess and modify interventions until short term weight is achieved;Long Term: Adherence to nutrition and physical activity/exercise program aimed toward attainment of established weight goal;Weight Loss: Understanding of general recommendations for a balanced deficit meal plan, which promotes 1-2 lb weight loss per week and includes a negative energy balance of [phone] kcal/d;Understanding recommendations for meals to include 15-35% energy as protein, 25-35% energy from fat, 35-60% energy from carbohydrates, less than 228m of dietary cholesterol, 20-35 gm of total fiber daily    Diabetes Yes    Intervention Provide education about signs/symptoms and action to take for hypo/hyperglycemia.;Provide education about proper nutrition, including hydration, and aerobic/resistive exercise prescription along with prescribed medications to achieve blood glucose in normal  ranges: Fasting glucose 65-99 mg/dL    Expected Outcomes Long Term: Attainment of HbA1C < 7%.;Short Term: Participant verbalizes understanding of the signs/symptoms and immediate care of hyper/hypoglycemia, proper foot care and importance of medication, aerobic/resistive exercise and nutrition plan for blood glucose control.    Hypertension Yes    Intervention Provide education on lifestyle modifcations including regular physical activity/exercise, weight management, moderate sodium restriction and increased consumption of fresh fruit, vegetables, and low fat dairy, alcohol moderation, and smoking cessation.;Monitor prescription use compliance.    Expected Outcomes Short Term: Continued assessment and intervention until BP is < 140/931mHG in hypertensive participants. < 130/8014mG in hypertensive participants with diabetes, heart failure or chronic kidney disease.;Long Term: Maintenance of blood pressure at goal levels.    Lipids Yes    Intervention Provide education and support for participant on nutrition & aerobic/resistive exercise along with prescribed medications to achieve LDL <79m56mDL >40mg63m Expected Outcomes Short Term: Participant states understanding of desired cholesterol values and is compliant with medications prescribed. Participant is following exercise prescription and nutrition guidelines.;Long Term: Cholesterol controlled with medications as prescribed, with individualized exercise RX and with personalized nutrition plan. Value goals: LDL < 79mg,88m > 40 mg.           Education:Diabetes - Individual verbal and written instruction to review signs/symptoms of diabetes, desired ranges of glucose level fasting, after meals and with exercise. Acknowledge that pre and post exercise glucose checks will be done for 3 sessions at entry of program.   [facility]  Date 08/03/19  Educator [facility]  Instruction Review Code 1- Verbalizes  Understanding      Education: Know Your Numbers and Risk Factors: -Group verbal and written instruction about important numbers in your health.  Discussion of what are risk factors and how they play a role in the disease process.  Review of Cholesterol, Blood Pressure, Diabetes, and BMI and the role they play in your overall health.   Core Components/Risk Factors/Patient Goals Review:    Core Components/Risk Factors/Patient Goals at Discharge (Final Review):    ITP Comments:  ITP Comments    Row Name 07/15/19 1122 08/03/19 0936         ITP Comments Virtual Visit completed. Patient informed on EP and RD appointment and 6 Minute walk test. Patient also informed of patient health questionnaires on My Chart. Patient Verbalizes understanding. Visit diagnosis can be found in CHL6/22/2021. Completed 6MWT and gym orientation. Initial ITP created and sent for review to Dr. Emily Filbert, Medical Director.             Comments: Initial ITP

## 2019-08-04 ENCOUNTER — Other Ambulatory Visit: Payer: Self-pay | Admitting: Cardiothoracic Surgery

## 2019-08-04 DIAGNOSIS — Z951 Presence of aortocoronary bypass graft: Secondary | ICD-10-CM

## 2019-08-05 ENCOUNTER — Other Ambulatory Visit: Payer: Self-pay

## 2019-08-05 ENCOUNTER — Encounter: Payer: Self-pay | Admitting: Cardiothoracic Surgery

## 2019-08-05 ENCOUNTER — Ambulatory Visit
Admission: RE | Admit: 2019-08-05 | Discharge: 2019-08-05 | Disposition: A | Discharge status: Home / Self Care | Payer: Medicare Other | Source: Ambulatory Visit | Attending: Cardiothoracic Surgery | Admitting: Cardiothoracic Surgery

## 2019-08-05 ENCOUNTER — Ambulatory Visit (INDEPENDENT_AMBULATORY_CARE_PROVIDER_SITE_OTHER): Payer: Self-pay | Admitting: Cardiothoracic Surgery

## 2019-08-05 VITALS — BP 116/75 | HR 74 | Temp 97.0°F | Resp 16 | Ht 67.0 in | Wt 211.2 lb

## 2019-08-05 DIAGNOSIS — I2510 Atherosclerotic heart disease of native coronary artery without angina pectoris: Secondary | ICD-10-CM

## 2019-08-05 DIAGNOSIS — Z951 Presence of aortocoronary bypass graft: Secondary | ICD-10-CM

## 2019-08-05 NOTE — Progress Notes (Signed)
PCP is Vertell Limber, [place] Chard, MD Referring Provider is [name],*  Chief Complaint  Patient presents with  . Routine Post Op    s/p CABG X 4 6/22//21 with a CXR    HPI: 1 month after CABG x4 with left IMA to LAD.  Doing very well and ready to start cardiac rehab at [facility].  Surgical incisions well-healed.  No symptoms of angina or CHF.  No change in medications.  Maintaining sinus rhythm so he will stop his amiodarone once he finishes his current supply.  Chest x-ray performed today personally reviewed showing sternal wires intact, stable mediastinal silhouette, no significant pleural effusion.   Past Medical History:  Diagnosis Date  . Aortic stenosis    mild to moderate AS 05/2019 echo   . CAD in native artery 06/09/2019  . Diabetes mellitus type 2 in obese ([place]) 06/09/2019  . Essential hypertension 06/09/2019  . Melanoma ([place])    back  . Pure hypercholesterolemia 06/09/2019    Past Surgical History:  Procedure Laterality Date  . CORONARY ARTERY BYPASS GRAFT N/A 06/30/2019   Procedure: CORONARY ARTERY BYPASS GRAFTING (CABG) x 4, LIMA TO DIAGONAL 1, SVG TO OML, SVG TO RAMUS, SVG TO PDA;  Surgeon: Prescott Gum, Collier Salina, MD;  Location: [place];  Service: Open Heart Surgery;  Laterality: N/A;  . ENDOVEIN HARVEST OF GREATER SAPHENOUS VEIN Bilateral 06/30/2019   Procedure: ENDOVEIN HARVEST OF GREATER SAPHENOUS VEIN;  Surgeon: Ivin Poot, MD;  Location: [place];  Service: Open Heart Surgery;  Laterality: Bilateral;  . MELANOMA EXCISION     back  . TEE WITHOUT CARDIOVERSION N/A 06/30/2019   Procedure: TRANSESOPHAGEAL ECHOCARDIOGRAM (TEE);  Surgeon: Prescott Gum, Collier Salina, MD;  Location: [place];  Service: Open Heart Surgery;  Laterality: N/A;  . THYROGLOSSAL DUCT CYST      Family History  Problem Relation Age of Onset  . CAD Father   . Valvular heart disease Father   . Heart disease Maternal Uncle   . Heart disease Maternal Grandfather     Social History Social History    Tobacco Use  . Smoking status: Former Smoker    Packs/day: 2.00    Years: 15.00    Pack years: 30.00    Types: Cigarettes    Quit date: 1979    Years since quitting: 42.6  . Smokeless tobacco: Never Used  Vaping Use  . Vaping Use: Never used  Substance Use Topics  . Alcohol use: Yes    Alcohol/week: 6.0 - 8.0 standard drinks    Types: 6 - 8 Glasses of wine per week    Comment: wine   . Drug use: Not on file    Current Outpatient Medications  Medication Sig Dispense Refill  . amiodarone (PACERONE) 200 MG tablet Take 1 tablet (200 mg total) by mouth daily. 60 tablet 1  . aspirin EC 325 MG EC tablet Take 1 tablet (325 mg total) by mouth daily. 30 tablet 0  . carvedilol (COREG) 6.25 MG tablet Take 1 tablet (6.25 mg total) by mouth 2 (two) times daily with a meal. 60 tablet 1  . metFORMIN (GLUCOPHAGE) 500 MG tablet Take 1 tablet (500 mg total) by mouth 2 (two) times daily with a meal. 60 tablet 1  . Multiple Vitamins-Minerals (MULTIVITAMIN WITH MINERALS) tablet Take 1 tablet by mouth daily.    . rosuvastatin (CRESTOR) 40 MG tablet Take 1 tablet (40 mg total) by mouth at bedtime. 90 tablet 1   No current facility-administered  medications for this visit.    No Known Allergies  Review of Systems  Sleep improved Appetite improved Energy improved Anxious to start walking on his treadmill  BP 116/75 (BP Location: Left Arm, Patient Position: Sitting, Cuff Size: Normal)   Pulse 74   Temp (!) 97 F (36.1 C)   Resp 16   Ht 5\' 7"  (1.702 m)   Wt (!) 211 lb 3.2 oz (95.8 kg)   SpO2 95% Comment: RA  BMI 33.08 kg/m  Physical Exam      Exam    General- alert and comfortable.  Sternal and leg incisions healing well.    Neck- no JVD, no cervical adenopathy palpable, no carotid bruit   Lungs- clear without rales, wheezes   Cor- regular rate and rhythm, no murmur , gallop   Abdomen- soft, non-tender   Extremities - warm, non-tender, minimal edema   Neuro- oriented, appropriate,  no focal weakness   Diagnostic Tests: Chest x-ray personally reviewed results as noted above  Impression: Excellent recovery 5 weeks after multivessel CABG. Continue current medications except the amiodarone will be stopped He may drive and lift up to 10 pounds maximum. He can apply skin lotion to the incisions.  Plan: Return in 8 weeks for final exam and review of progress and to discuss lifting restrictions on activity.  Len Childs, MD [facility] [phone]

## 2019-08-11 ENCOUNTER — Other Ambulatory Visit: Payer: Self-pay

## 2019-08-11 ENCOUNTER — Encounter: Payer: Medicare Other | Attending: Cardiovascular Disease | Admitting: *Deleted

## 2019-08-11 DIAGNOSIS — Z79899 Other long term (current) drug therapy: Secondary | ICD-10-CM | POA: Diagnosis not present

## 2019-08-11 DIAGNOSIS — Z87891 Personal history of nicotine dependence: Secondary | ICD-10-CM | POA: Insufficient documentation

## 2019-08-11 DIAGNOSIS — Z6832 Body mass index (BMI) 32.0-32.9, adult: Secondary | ICD-10-CM | POA: Diagnosis not present

## 2019-08-11 DIAGNOSIS — Z7982 Long term (current) use of aspirin: Secondary | ICD-10-CM | POA: Insufficient documentation

## 2019-08-11 DIAGNOSIS — E119 Type 2 diabetes mellitus without complications: Secondary | ICD-10-CM | POA: Insufficient documentation

## 2019-08-11 DIAGNOSIS — I350 Nonrheumatic aortic (valve) stenosis: Secondary | ICD-10-CM | POA: Diagnosis not present

## 2019-08-11 DIAGNOSIS — E7800 Pure hypercholesterolemia, unspecified: Secondary | ICD-10-CM | POA: Diagnosis not present

## 2019-08-11 DIAGNOSIS — E669 Obesity, unspecified: Secondary | ICD-10-CM | POA: Insufficient documentation

## 2019-08-11 DIAGNOSIS — Z85820 Personal history of malignant melanoma of skin: Secondary | ICD-10-CM | POA: Diagnosis not present

## 2019-08-11 DIAGNOSIS — Z7984 Long term (current) use of oral hypoglycemic drugs: Secondary | ICD-10-CM | POA: Diagnosis not present

## 2019-08-11 DIAGNOSIS — I10 Essential (primary) hypertension: Secondary | ICD-10-CM | POA: Insufficient documentation

## 2019-08-11 DIAGNOSIS — I2510 Atherosclerotic heart disease of native coronary artery without angina pectoris: Secondary | ICD-10-CM | POA: Diagnosis not present

## 2019-08-11 DIAGNOSIS — Z951 Presence of aortocoronary bypass graft: Secondary | ICD-10-CM | POA: Insufficient documentation

## 2019-08-11 LAB — GLUCOSE, CAPILLARY
Glucose-Capillary: 152 mg/dL — ABNORMAL HIGH (ref 70–99)
Glucose-Capillary: 152 mg/dL — ABNORMAL HIGH (ref 70–99)
Glucose-Capillary: 148 mg/dL — ABNORMAL HIGH (ref 70–99)
Glucose-Capillary: 148 mg/dL — ABNORMAL HIGH (ref 70–99)

## 2019-08-11 NOTE — Progress Notes (Signed)
Daily Session Note  Patient Details  Name: Phillip [name] MRN: [id number] Date of Birth: 05-04-1950 Referring Provider:     Cardiac Rehab from 08/03/2019 in [facility] Cardiac and Pulmonary Rehab  Referring Provider Skeet Latch MD      Encounter Date: 08/11/2019  Check In:  Session Check In - 08/11/19 0817      Check-In   Supervising physician immediately available to respond to emergencies See telemetry face sheet for immediately available ER MD    Location [facility]-Cardiac & Pulmonary Rehab    Staff Present Heath Lark, RN, BSN, Jacklynn Bue, MS Exercise Physiologist;Amanda Oletta Darter, [place], ACSM CEP, Exercise Physiologist    Virtual Visit No    Medication changes reported     No    Fall or balance concerns reported    No    Warm-up and Cool-down Performed on first and last piece of equipment    Resistance Training Performed Yes    VAD Patient? No    PAD/SET Patient? No      Pain Assessment   Currently in Pain? No/denies              Social History   Tobacco Use  Smoking Status Former Smoker  . Packs/day: 2.00  . Years: 15.00  . Pack years: 30.00  . Types: Cigarettes  . Quit date: 81  . Years since quitting: 42.6  Smokeless Tobacco Never Used    Goals Met:  Exercise tolerated well Personal goals reviewed No report of cardiac concerns or symptoms  Goals Unmet:  Not Applicable  Comments: First full day of exercise!  Patient was oriented to gym and equipment including functions, settings, policies, and procedures.  Patient's individual exercise prescription and treatment plan were reviewed.  All starting workloads were established based on the results of the 6 minute walk test done at initial orientation visit.  The plan for exercise progression was also introduced and progression will be customized based on patient's performance and goals.    Dr. Emily Filbert is Medical Director for [place] [facility].

## 2019-08-12 ENCOUNTER — Ambulatory Visit: Payer: Medicare Other

## 2019-08-18 ENCOUNTER — Encounter: Payer: Self-pay | Admitting: Cardiovascular Disease

## 2019-08-18 ENCOUNTER — Other Ambulatory Visit: Payer: Self-pay

## 2019-08-18 ENCOUNTER — Ambulatory Visit (INDEPENDENT_AMBULATORY_CARE_PROVIDER_SITE_OTHER): Payer: Medicare Other | Admitting: Cardiovascular Disease

## 2019-08-18 VITALS — BP 126/76 | HR 71 | Ht 67.0 in | Wt 213.6 lb

## 2019-08-18 DIAGNOSIS — Z5181 Encounter for therapeutic drug level monitoring: Secondary | ICD-10-CM | POA: Diagnosis not present

## 2019-08-18 DIAGNOSIS — I10 Essential (primary) hypertension: Secondary | ICD-10-CM

## 2019-08-18 DIAGNOSIS — E7800 Pure hypercholesterolemia, unspecified: Secondary | ICD-10-CM | POA: Diagnosis not present

## 2019-08-18 DIAGNOSIS — I2510 Atherosclerotic heart disease of native coronary artery without angina pectoris: Secondary | ICD-10-CM

## 2019-08-18 DIAGNOSIS — I209 Angina pectoris, unspecified: Secondary | ICD-10-CM | POA: Diagnosis not present

## 2019-08-18 DIAGNOSIS — I350 Nonrheumatic aortic (valve) stenosis: Secondary | ICD-10-CM

## 2019-08-18 DIAGNOSIS — Z951 Presence of aortocoronary bypass graft: Secondary | ICD-10-CM

## 2019-08-18 LAB — COMPREHENSIVE METABOLIC PANEL
ALT: 35 IU/L (ref 0–44)
AST: 41 IU/L — ABNORMAL HIGH (ref 0–40)
Albumin/Globulin Ratio: 2 (ref 1.2–2.2)
Albumin: 4.7 g/dL (ref 3.8–4.8)
Alkaline Phosphatase: 79 IU/L (ref 48–121)
BUN/Creatinine Ratio: 15 (ref 10–24)
BUN: 15 mg/dL (ref 8–27)
Bilirubin Total: 0.5 mg/dL (ref 0.0–1.2)
CO2: 23 mmol/L (ref 20–29)
Calcium: 10 mg/dL (ref 8.6–10.2)
Chloride: 101 mmol/L (ref 96–106)
Creatinine, Ser: 0.97 mg/dL (ref 0.76–1.27)
GFR calc Af Amer: 92 mL/min/{1.73_m2} (ref >59)
GFR calc non Af Amer: 80 mL/min/{1.73_m2} (ref >59)
Globulin, Total: 2.4 g/dL (ref 1.5–4.5)
Glucose: 141 mg/dL — ABNORMAL HIGH (ref 65–99)
Potassium: 5 mmol/L (ref 3.5–5.2)
Sodium: 139 mmol/L (ref 134–144)
Total Protein: 7.1 g/dL (ref 6.0–8.5)

## 2019-08-18 LAB — LIPID PANEL
Chol/HDL Ratio: 2.1 ratio (ref 0.0–5.0)
Cholesterol, Total: 112 mg/dL (ref 100–199)
HDL: 54 mg/dL (ref >39)
LDL Chol Calc (NIH): 41 mg/dL (ref 0–99)
Triglycerides: 87 mg/dL (ref 0–149)
VLDL Cholesterol Cal: 17 mg/dL (ref 5–40)

## 2019-08-18 NOTE — Progress Notes (Signed)
Cardiology Office Note .  Echo Date:  08/18/2019   ID:  Phillip [name], DOB 30-Sep-1950, MRN [id number]  PCP:  [name], MD  Cardiologist:   Skeet Latch, MD   No chief complaint on file.    History of Present Illness: Phillip [name] is a 69 y.o. male with CAD s/p CABG (LIMA-->LAD, SVG to OM, RI, PDA), mild to moderate aortic stenosis, hypertension, hyperlipidemia, mild ascending aortic aneurysm, and diabetes who presents for follow-up.  He was initially seen 04/2019 for a second opinion on coronary artery disease.   He initially had symptoms that he thought were attributable to hernia.  However his primary care provider was concerned that it may be ischemia.  He had an abnormal stress echo at [facility] on 05/25/19.  He achieved 7 METS and had chest pain with exercise.  There was 1 mm ST depression inferolaterally.  He developed hypokinesis in the LAD region and LV dilatation with stress.  He was referred to [facility] where high-sensitivity troponin was mildly elevated and flat (32, 36, 24).  LDL was 171 and hemoglobin A1c was 7.8%.  He underwent LHC, where he was found to have 3 vessel obstructive CAD, including proximal LAD occlusion with left to left collaterals.  He had occlusive disease in OM 2, OM1, mid to distal RCA, and PDA.  He also had right to left collaterals.  Echo revealed LVEF 50% with mild global hypokinesis and normal right ventricular function.  There is aortic valve thickening with restricted movement and mild to moderate aortic stenosis.  Dimensionless index 0.58 cm.  Mean gradient was not listed in the report.  The ascending aorta was mildly dilated at 4.1 cm.    [name] was referred to Dr. Prescott Gum and underwent CABG 06/2019.  He had an episode of syncope in the hospital postoperatively.  Otherwise his postoperative course was unremarkable.  He last saw Dr. Prescott Gum on 07/2019 and was cleared for cardiac rehab.  Amiodarone was discontinued at that  time.  This was for prophylaxis and he did not have any perioperative atrial fibrillation.  He continues to feel well.  He exercises on the treadmill and recumbent bike for 30-60 minutes at least four days per week.  He has no chest pain and his breathing is stable.  He has no lower extremity edema, orthopnea or PND.  He gets some chest muscle discomfort with certain movements.  He has been working on his diet.  He struggles mostly with portions but eats a very healthy diet.  He cooks at home and limit salt and carbohydrates.  He has an appointment with a new PCP in October.  He is otherwise well and without complaint.  He does have colonoscopy and cataract surgery coming up soon.   Past Medical History:  Diagnosis Date  . Aortic stenosis    mild to moderate AS 05/2019 echo   . CAD in native artery 06/09/2019  . Diabetes mellitus type 2 in obese ([place]) 06/09/2019  . Essential hypertension 06/09/2019  . Melanoma ([place])    back  . Pure hypercholesterolemia 06/09/2019    Past Surgical History:  Procedure Laterality Date  . CORONARY ARTERY BYPASS GRAFT N/A 06/30/2019   Procedure: CORONARY ARTERY BYPASS GRAFTING (CABG) x 4, LIMA TO DIAGONAL 1, SVG TO OML, SVG TO RAMUS, SVG TO PDA;  Surgeon: Prescott Gum, Collier Salina, MD;  Location: [place];  Service: Open Heart Surgery;  Laterality: N/A;  . ENDOVEIN HARVEST OF GREATER  SAPHENOUS VEIN Bilateral 06/30/2019   Procedure: ENDOVEIN HARVEST OF GREATER SAPHENOUS VEIN;  Surgeon: Ivin Poot, MD;  Location: [place];  Service: Open Heart Surgery;  Laterality: Bilateral;  . MELANOMA EXCISION     back  . TEE WITHOUT CARDIOVERSION N/A 06/30/2019   Procedure: TRANSESOPHAGEAL ECHOCARDIOGRAM (TEE);  Surgeon: Prescott Gum, Collier Salina, MD;  Location: [place];  Service: Open Heart Surgery;  Laterality: N/A;  . THYROGLOSSAL DUCT CYST       Current Outpatient Medications  Medication Sig Dispense Refill  . aspirin EC 81 MG tablet Take 81 mg by mouth daily. Swallow whole.    . carvedilol (COREG)  6.25 MG tablet Take 1 tablet (6.25 mg total) by mouth 2 (two) times daily with a meal. 60 tablet 1  . metFORMIN (GLUCOPHAGE) 500 MG tablet Take 1 tablet (500 mg total) by mouth 2 (two) times daily with a meal. 60 tablet 1  . Multiple Vitamins-Minerals (MULTIVITAMIN WITH MINERALS) tablet Take 1 tablet by mouth daily.    . rosuvastatin (CRESTOR) 40 MG tablet Take 1 tablet (40 mg total) by mouth at bedtime. 90 tablet 1   No current facility-administered medications for this visit.    Allergies:   Patient has no known allergies.    Social History:  The patient  reports that he quit smoking about 42 years ago. His smoking use included cigarettes. He has a 30.00 pack-year smoking history. He has never used smokeless tobacco. He reports current alcohol use of about 6.0 - 8.0 standard drinks of alcohol per week.   Family History:  The patient's family history includes CAD in his father; Heart disease in his maternal grandfather and maternal uncle; Valvular heart disease in his father.    ROS:  Please see the history of present illness.   Otherwise, review of systems are positive for none.   All other systems are reviewed and negative.    PHYSICAL EXAM: VS:  BP 126/76   Pulse 71   Ht 5\' 7"  (1.702 m)   Wt 213 lb 9.6 oz (96.9 kg)   SpO2 97%   BMI 33.45 kg/m  , BMI Body mass index is 33.45 kg/m. GENERAL:  Well appearing HEENT:  Pupils equal round and reactive, fundi not visualized, oral mucosa unremarkable NECK:  No jugular venous distention, waveform within normal limits, carotid upstroke brisk and symmetric, no bruits, no thyromegaly LYMPHATICS:  No cervical adenopathy LUNGS:  Clear to auscultation bilaterally HEART:  RRR.  PMI not displaced or sustained,S1 and S2 within normal limits, no S3, no S4, no clicks, no rubs, II/VI mid-peaking systolic murmur at the LUSB ABD:  Flat, positive bowel sounds normal in frequency in pitch, no bruits, no rebound, no guarding, no midline pulsatile mass, no  hepatomegaly, no splenomegaly EXT:  2 plus pulses throughout, no edema, no cyanosis no clubbing SKIN:  No rashes no nodules NEURO:  Cranial nerves II through XII grossly intact, motor grossly intact throughout PSYCH:  Cognitively intact, oriented to person place and time   EKG:  EKG is not ordered today. The ekg ordered 06/09/19 demonstrates sinus rhythm.  Rate 82 bpm.  LAD.    Echo 05/26/19: SUMMARY Mild left ventricular hypertrophy The left ventricle is mildly dilated. Left ventricular systolic function is mildly reduced. There is mild global hypokinesis of the left ventricle. The right ventricle is normal in size and function. The left atrial size is normal. Right atrial size is normal. Diffuse thickening of the aortic valve with restricted cusp opening.  Diffuse calcification of the aortic valve. There is mild to moderate aortic stenosis. By visual inspection of the leaflets the stenosis appears moderate but Doppler gradients indicate mild. Clinical correlation suggested. There is mild mitral regurgitation. There is mild tricuspid regurgitation. Borderline dilated ascending aorta. There is no pericardial effusion.  LHC 05/26/19: Coronary angiogram via right radial for high risk stress test. LM  engaged, mild disease. LAD has severe calcium and occluded ostial  segment. LCx gives off high OM1 (obstructive) with obstructive disease  mid and obstructive OM2. RCA engaged, obstructive prox, mid and distal  disease. L-L and R-L collaterals present. AV not crossed. EBL < 5 cc,  prelude sync for hemostasis, no specimens.   Recent Labs: 06/26/2019: ALT 56 07/03/2019: Magnesium 1.8 07/06/2019: BUN 21; Creatinine, Ser 1.14; Hemoglobin 9.6; Platelets 294; Potassium 3.2; Sodium 135    Lipid Panel No results found for: CHOL, TRIG, HDL, CHOLHDL, VLDL, LDLCALC, LDLDIRECT    Wt Readings from Last 3 Encounters:  08/18/19 213 lb 9.6 oz (96.9 kg)  08/05/19 (!) 211 lb 3.2 oz (95.8 kg)   08/03/19 (!) 212 lb 6.4 oz (96.3 kg)     ASSESSMENT AND PLAN:  # CAD s/p CABG:  # Hyperlipidemia:  Mr. Haskell is doing well post CABG. he is exercising regularly and has no exertional symptoms.  Continue aspirin, carvedilol, and rosuvastatin.  Rosuvastatin was a new medication.  We will check lipids and a CMP today.  # Aortic stenosis:  Mild-moderate by report.  There is no evidence of heart failure on exam or by report.  We will get an echocardiogram in 1 year and see him after that time.  # DM:  Mr. Riera will be seeing a new PCP in October.  Continue Metformin for now.  He continues to work on his diet and limiting carbohydrate intake.  # Hypertension:  Blood pressure well-controlled on carvedilol.  Continue diet and exercise.  # Per-operative risk assessment:  He has upcoming colonoscopy and cataract surgery appointments.  He is low risk for both of these procedures and may proceed.  Current medicines are reviewed at length with the patient today.  The patient does not have concerns regarding medicines.  The following changes have been made: Start rosuvastatin and metoprolol.  Labs/ tests ordered today include:   Orders Placed This Encounter  Procedures  . Lipid panel  . Comprehensive metabolic panel  . ECHOCARDIOGRAM COMPLETE     Disposition:   FU with Sami Froh C. Oval Linsey, MD, [facility] in 1 year   Signed, Aries Townley C. Oval Linsey, MD, [facility]  08/18/2019 9:34 AM    [place]

## 2019-08-18 NOTE — Patient Instructions (Addendum)
Medication Instructions:  Your physician recommends that you continue on your current medications as directed. Please refer to the Current Medication list given to you today.  *If you need a refill on your cardiac medications before your next appointment, please call your pharmacy*  Lab Work: LP/CMET TODAY   If you have labs (blood work) drawn today and your tests are completely normal, you will receive your results only by: Marland Kitchen MyChart Message (if you have MyChart) OR . A paper copy in the mail If you have any lab test that is abnormal or we need to change your treatment, we will call you to review the results.  Testing/Procedures: Your physician has requested that you have an echocardiogram. Echocardiography is a painless test that uses sound waves to create images of your heart. It provides your doctor with information about the size and shape of your heart and how well your heart's chambers and valves are working. This procedure takes approximately one hour. There are no restrictions for this procedure. 1 YEAR PRIOR TO FOLLOW UP   Follow-Up: At [facility], you and your health needs are our priority.  As part of our continuing mission to provide you with exceptional heart care, we have created designated Provider Care Teams.  These Care Teams include your primary Cardiologist (physician) and Advanced Practice Providers (APPs -  Physician Assistants and Nurse Practitioners) who all work together to provide you with the care you need, when you need it.  We recommend signing up for the patient portal called "MyChart".  Sign up information is provided on this After Visit Summary.  MyChart is used to connect with patients for Virtual Visits (Telemedicine).  Patients are able to view lab/test results, encounter notes, upcoming appointments, etc.  Non-urgent messages can be sent to your provider as well.   To learn more about what you can do with MyChart, go to [email or web address].    Your  next appointment:   12 month(s)  The format for your next appointment:   In Person  Provider:   You may see Skeet Latch, MD or one of the following Advanced Practice Providers on your designated Care Team:    Kerin Ransom, PA-C  Fernando Salinas, [place]  Coletta Memos, [place]

## 2019-08-19 ENCOUNTER — Telehealth: Payer: Self-pay

## 2019-08-19 ENCOUNTER — Encounter: Payer: Self-pay | Admitting: *Deleted

## 2019-08-19 DIAGNOSIS — Z951 Presence of aortocoronary bypass graft: Secondary | ICD-10-CM

## 2019-08-19 NOTE — Progress Notes (Signed)
Discharge Progress Report  Patient Details  Name: Phillip [name] MRN: [id number] Date of Birth: 06/14/1950 Referring Provider:     Cardiac Rehab from 08/03/2019 in [facility] Cardiac and Pulmonary Rehab  Referring Provider Skeet Latch MD       Number of Visits: 2  Reason for Discharge:  Early Exit:  Personal due to COVID-19.  Smoking History:  Social History   Tobacco Use  Smoking Status Former Smoker  . Packs/day: 2.00  . Years: 15.00  . Pack years: 30.00  . Types: Cigarettes  . Quit date: 8  . Years since quitting: 42.6  Smokeless Tobacco Never Used    Diagnosis:  S/P CABG x 4  ADL UCSD:   Initial Exercise Prescription:  Initial Exercise Prescription - 08/03/19 0900      Date of Initial Exercise RX and Referring Provider   Date 08/03/19    Referring Provider Skeet Latch MD      Treadmill   MPH 2.5    Grade 1    Minutes 15    METs 3.26      Recumbant Bike   Level 3    RPM 50    Watts 30    Minutes 15    METs 3      NuStep   Level 3    SPM 80    Minutes 15    METs 3      REL-XR   Level 2    Speed 50    Minutes 15    METs 3      Prescription Details   Frequency (times per week) 2    Duration Progress to 30 minutes of continuous aerobic without signs/symptoms of physical distress      Intensity   THRR 40-80% of Max Heartrate 102-135    Ratings of Perceived Exertion 11-13    Perceived Dyspnea 0-4      Progression   Progression Continue to progress workloads to maintain intensity without signs/symptoms of physical distress.      Resistance Training   Training Prescription Yes    Weight 5 lb    Reps 10-15           Discharge Exercise Prescription (Final Exercise Prescription Changes):  Exercise Prescription Changes - 08/18/19 1300      Response to Exercise   Blood Pressure (Admit) 108/66    Blood Pressure (Exercise) 144/78    Blood Pressure (Exit) 122/66    Heart Rate (Admit) 91 bpm    Heart Rate (Exercise) 104 bpm      Heart Rate (Exit) 79 bpm    Rating of Perceived Exertion (Exercise) 10    Symptoms none    Comments first day      Progression   Progression Continue to progress workloads to maintain intensity without signs/symptoms of physical distress.    Average METs 2.5      Resistance Training   Training Prescription Yes    Weight 5 lb    Reps 10-15      Interval Training   Interval Training No      Recumbant Bike   Level 3    RPM 50    Watts 19    Minutes 15    METs 2.6      NuStep   Level 3    SPM 80    Minutes 15    METs 2.5           Functional Capacity:  6 Minute Walk  [place] Name 08/03/19 0937         6 Minute Walk   Phase Initial     Distance 1395 feet     Walk Time 6 minutes     # of Rest Breaks 0     MPH 2.64     METS 2.94     RPE 9     VO2 Peak 10.28     Symptoms No     Resting HR 68 bpm     Resting BP 124/66     Resting Oxygen Saturation  96 %     Exercise Oxygen Saturation  during 6 min walk 96 %     Max Ex. HR 103 bpm     Max Ex. BP 130/68     2 Minute Post BP 126/64            Psychological, QOL, Others - Outcomes: PHQ 2/9: Depression screen PHQ 2/9 08/03/2019  Decreased Interest 0  Down, Depressed, Hopeless 0  PHQ - 2 Score 0  Altered sleeping 2  Tired, decreased energy 0  Change in appetite 0  Feeling bad or failure about yourself  0  Trouble concentrating 0  Moving slowly or fidgety/restless 0  Suicidal thoughts 0  PHQ-9 Score 2  Difficult doing work/chores Not difficult at all    Quality of Life:  Quality of Life - 08/03/19 0940      Quality of Life   Select Quality of Life      Quality of Life Scores   Health/Function Pre 25.63 %    Socioeconomic Pre 23.62 %    Psych/Spiritual Pre 27.85 %    Family Pre 18.6 %    GLOBAL Pre 24.61 %              Nutrition & Weight - Outcomes:  Pre Biometrics - 08/03/19 0940      Pre Biometrics   Height 5' 8.2" (1.732 m)    Weight 212 lb 6.4 oz (96.3 kg)    BMI (Calculated)  32.12    Single Leg Stand 29.25 seconds            Nutrition:   Nutrition Discharge:  Nutrition Assessments - 08/03/19 0941      MEDFICTS Scores   Pre Score 3           Education Questionnaire Score:  Knowledge Questionnaire Score - 08/03/19 0941      Knowledge Questionnaire Score   Pre Score 24/26 Education Focus: [place], [place]           Goals reviewed with patient; copy given to patient.

## 2019-08-19 NOTE — Progress Notes (Signed)
Cardiac Individual Treatment Plan  Patient Details  Name: Phillip [name] MRN: [id number] Date of Birth: 1950/03/19 Referring Provider:     Cardiac Rehab from 08/03/2019 in [facility] Cardiac and Pulmonary Rehab  Referring Provider Skeet Latch MD      Initial Encounter Date:    Cardiac Rehab from 08/03/2019 in [facility] Cardiac and Pulmonary Rehab  Date 08/03/19      Visit Diagnosis: S/P CABG x 4  Patient's Home Medications on Admission:  Current Outpatient Medications:  .  aspirin EC 81 MG tablet, Take 81 mg by mouth daily. Swallow whole., Disp: , Rfl:  .  carvedilol (COREG) 6.25 MG tablet, Take 1 tablet (6.25 mg total) by mouth 2 (two) times daily with a meal., Disp: 60 tablet, Rfl: 1 .  metFORMIN (GLUCOPHAGE) 500 MG tablet, Take 1 tablet (500 mg total) by mouth 2 (two) times daily with a meal., Disp: 60 tablet, Rfl: 1 .  Multiple Vitamins-Minerals (MULTIVITAMIN WITH MINERALS) tablet, Take 1 tablet by mouth daily., Disp: , Rfl:  .  rosuvastatin (CRESTOR) 40 MG tablet, Take 1 tablet (40 mg total) by mouth at bedtime., Disp: 90 tablet, Rfl: 1  Past Medical History: Past Medical History:  Diagnosis Date  . Aortic stenosis    mild to moderate AS 05/2019 echo   . CAD in native artery 06/09/2019  . Diabetes mellitus type 2 in obese ([place]) 06/09/2019  . Essential hypertension 06/09/2019  . Melanoma ([place])    back  . Pure hypercholesterolemia 06/09/2019    Tobacco Use: Social History   Tobacco Use  Smoking Status Former Smoker  . Packs/day: 2.00  . Years: 15.00  . Pack years: 30.00  . Types: Cigarettes  . Quit date: 34  . Years since quitting: 42.6  Smokeless Tobacco Never Used    Labs: Recent Chemical engineer    Labs for ITP Cardiac and Pulmonary Rehab Latest Ref Rng & Units 07/04/2019 07/04/2019 07/05/2019 07/06/2019 08/18/2019   Cholestrol 100 - 199 mg/dL - - - - 112   LDLCALC 0 - 99 mg/dL - - - - 41   HDL >39 mg/dL - - - - 54   Trlycerides 0 - 149 mg/dL - - - - 87    Hemoglobin A1c 4.8 - 5.6 % - - - - -   PHART 7.35 - 7.45 - - - - -   PCO2ART 32 - 48 mmHg - - - - -   HCO3 20.0 - 28.0 mmol/L - - - - -   TCO2 22 - 32 mmol/L - - - - -   ACIDBASEDEF 0.0 - 2.0 mmol/L - - - - -   O2SAT % 39.3 46.7 55.7 53.1 -       Exercise Target Goals: Exercise Program Goal: Individual exercise prescription set using results from initial 6 min walk test and THRR while considering  patient's activity barriers and safety.   Exercise Prescription Goal: Initial exercise prescription builds to 30-45 minutes a day of aerobic activity, 2-3 days per week.  Home exercise guidelines will be given to patient during program as part of exercise prescription that the participant will acknowledge.   Education: Aerobic Exercise & Resistance Training: - Gives group verbal and written instruction on the various components of exercise. Focuses on aerobic and resistive training programs and the benefits of this training and how to safely progress through these programs..   Education: Exercise & Equipment Safety: - Individual verbal instruction and demonstration of equipment use and safety with use  of the equipment.   Cardiac Rehab from 08/03/2019 in [facility] Cardiac and Pulmonary Rehab  Date 08/03/19  Educator [facility]  Instruction Review Code 1- Verbalizes Understanding      Education: Exercise Physiology & General Exercise Guidelines: - Group verbal and written instruction with models to review the exercise physiology of the cardiovascular system and associated critical values. Provides general exercise guidelines with specific guidelines to those with heart or lung disease.    Education: Flexibility, Balance, Mind/Body Relaxation: Provides group verbal/written instruction on the benefits of flexibility and balance training, including mind/body exercise modes such as yoga, pilates and tai chi.  Demonstration and skill practice provided.   Activity Barriers & Risk Stratification:   Activity Barriers & Cardiac Risk Stratification - 08/03/19 0937      Activity Barriers & Cardiac Risk Stratification   Activity Barriers Other (comment)    Comments sternal restrictions only    Cardiac Risk Stratification High           6 Minute Walk:  6 Minute Walk    Row Name 08/03/19 0937         6 Minute Walk   Phase Initial     Distance 1395 feet     Walk Time 6 minutes     # of Rest Breaks 0     MPH 2.64     METS 2.94     RPE 9     VO2 Peak 10.28     Symptoms No     Resting HR 68 bpm     Resting BP 124/66     Resting Oxygen Saturation  96 %     Exercise Oxygen Saturation  during 6 min walk 96 %     Max Ex. HR 103 bpm     Max Ex. BP 130/68     2 Minute Post BP 126/64            Oxygen Initial Assessment:   Oxygen Re-Evaluation:   Oxygen Discharge (Final Oxygen Re-Evaluation):   Initial Exercise Prescription:  Initial Exercise Prescription - 08/03/19 0900      Date of Initial Exercise RX and Referring Provider   Date 08/03/19    Referring Provider Skeet Latch MD      Treadmill   MPH 2.5    Grade 1    Minutes 15    METs 3.26      Recumbant Bike   Level 3    RPM 50    Watts 30    Minutes 15    METs 3      NuStep   Level 3    SPM 80    Minutes 15    METs 3      REL-XR   Level 2    Speed 50    Minutes 15    METs 3      Prescription Details   Frequency (times per week) 2    Duration Progress to 30 minutes of continuous aerobic without signs/symptoms of physical distress      Intensity   THRR 40-80% of Max Heartrate 102-135    Ratings of Perceived Exertion 11-13    Perceived Dyspnea 0-4      Progression   Progression Continue to progress workloads to maintain intensity without signs/symptoms of physical distress.      Resistance Training   Training Prescription Yes    Weight 5 lb    Reps 10-15  Perform Capillary Blood Glucose checks as needed.  Exercise Prescription Changes:  Exercise Prescription  Changes    Row Name 08/03/19 0900 08/18/19 1300           Response to Exercise   Blood Pressure (Admit) 124/66 108/66      Blood Pressure (Exercise) 130/68 144/78      Blood Pressure (Exit) 126/64 122/66      Heart Rate (Admit) 68 bpm 91 bpm      Heart Rate (Exercise) 103 bpm 104 bpm      Heart Rate (Exit) 66 bpm 79 bpm      Oxygen Saturation (Admit) 96 % --      Oxygen Saturation (Exercise) 96 % --      Rating of Perceived Exertion (Exercise) 9 10      Symptoms none none      Comments walk test results first day        Progression   Progression -- Continue to progress workloads to maintain intensity without signs/symptoms of physical distress.      Average METs -- 2.5        Resistance Training   Training Prescription -- Yes      Weight -- 5 lb      Reps -- 10-15        Interval Training   Interval Training -- No        Recumbant Bike   Level -- 3      RPM -- 50      Watts -- 19      Minutes -- 15      METs -- 2.6        NuStep   Level -- 3      SPM -- 80      Minutes -- 15      METs -- 2.5             Exercise Comments:  Exercise Comments    Row Name 08/11/19 0818           Exercise Comments First full day of exercise!  Patient was oriented to gym and equipment including functions, settings, policies, and procedures.  Patient's individual exercise prescription and treatment plan were reviewed.  All starting workloads were established based on the results of the 6 minute walk test done at initial orientation visit.  The plan for exercise progression was also introduced and progression will be customized based on patient's performance and goals.              Exercise Goals and Review:  Exercise Goals    Row Name 08/03/19 0939             Exercise Goals   Increase Physical Activity Yes       Intervention Develop an individualized exercise prescription for aerobic and resistive training based on initial evaluation findings, risk stratification,  comorbidities and participant's personal goals.;Provide advice, education, support and counseling about physical activity/exercise needs.       Expected Outcomes Short Term: Attend rehab on a regular basis to increase amount of physical activity.;Long Term: Add in home exercise to make exercise part of routine and to increase amount of physical activity.;Long Term: Exercising regularly at least 3-5 days a week.       Increase Strength and Stamina Yes       Intervention Provide advice, education, support and counseling about physical activity/exercise needs.;Develop an individualized exercise prescription for aerobic and resistive training based on initial evaluation findings,  risk stratification, comorbidities and participant's personal goals.       Expected Outcomes Short Term: Increase workloads from initial exercise prescription for resistance, speed, and METs.;Short Term: Perform resistance training exercises routinely during rehab and add in resistance training at home;Long Term: Improve cardiorespiratory fitness, muscular endurance and strength as measured by increased METs and functional capacity (6MWT)       Able to understand and use rate of perceived exertion (RPE) scale Yes       Intervention Provide education and explanation on how to use RPE scale       Expected Outcomes Short Term: Able to use RPE daily in rehab to express subjective intensity level;Long Term:  Able to use RPE to guide intensity level when exercising independently       Able to understand and use Dyspnea scale Yes       Intervention Provide education and explanation on how to use Dyspnea scale       Expected Outcomes Short Term: Able to use Dyspnea scale daily in rehab to express subjective sense of shortness of breath during exertion;Long Term: Able to use Dyspnea scale to guide intensity level when exercising independently       Knowledge and understanding of Target Heart Rate Range (THRR) Yes       Intervention Provide  education and explanation of THRR including how the numbers were predicted and where they are located for reference       Expected Outcomes Short Term: Able to state/look up THRR;Short Term: Able to use daily as guideline for intensity in rehab;Long Term: Able to use THRR to govern intensity when exercising independently       Able to check pulse independently Yes       Intervention Provide education and demonstration on how to check pulse in carotid and radial arteries.       Expected Outcomes Short Term: Able to explain why pulse checking is important during independent exercise;Long Term: Able to check pulse independently and accurately       Understanding of Exercise Prescription Yes       Intervention Provide education, explanation, and written materials on patient's individual exercise prescription       Expected Outcomes Short Term: Able to explain program exercise prescription;Long Term: Able to explain home exercise prescription to exercise independently              Exercise Goals Re-Evaluation :  Exercise Goals Re-Evaluation    Row Name 08/11/19 0818             Exercise Goal Re-Evaluation   Exercise Goals Review Able to understand and use rate of perceived exertion (RPE) scale;Knowledge and understanding of Target Heart Rate Range (THRR);Understanding of Exercise Prescription       Comments Reviewed RPE and dyspnea scales, THR and program prescription with pt today.  Pt voiced understanding and was given a copy of goals to take home.       Expected Outcomes Short: Use RPE daily to regulate intensity. Long: Follow program prescription in THR.              Discharge Exercise Prescription (Final Exercise Prescription Changes):  Exercise Prescription Changes - 08/18/19 1300      Response to Exercise   Blood Pressure (Admit) 108/66    Blood Pressure (Exercise) 144/78    Blood Pressure (Exit) 122/66    Heart Rate (Admit) 91 bpm    Heart Rate (Exercise) 104 bpm    Heart  Rate (  Exit) 79 bpm    Rating of Perceived Exertion (Exercise) 10    Symptoms none    Comments first day      Progression   Progression Continue to progress workloads to maintain intensity without signs/symptoms of physical distress.    Average METs 2.5      Resistance Training   Training Prescription Yes    Weight 5 lb    Reps 10-15      Interval Training   Interval Training No      Recumbant Bike   Level 3    RPM 50    Watts 19    Minutes 15    METs 2.6      NuStep   Level 3    SPM 80    Minutes 15    METs 2.5           Nutrition:  Target Goals: Understanding of nutrition guidelines, daily intake of sodium <1555m, cholesterol <2029m calories 30% from fat and 7% or less from saturated fats, daily to have 5 or more servings of fruits and vegetables.  Education: Controlling Sodium/Reading Food Labels -Group verbal and written material supporting the discussion of sodium use in heart healthy nutrition. Review and explanation with models, verbal and written materials for utilization of the food label.   Education: General Nutrition Guidelines/Fats and Fiber: -Group instruction provided by verbal, written material, models and posters to present the general guidelines for heart healthy nutrition. Gives an explanation and review of dietary fats and fiber.   Biometrics:  Pre Biometrics - 08/03/19 0940      Pre Biometrics   Height 5' 8.2" (1.732 m)    Weight 212 lb 6.4 oz (96.3 kg)    BMI (Calculated) 32.12    Single Leg Stand 29.25 seconds            Nutrition Therapy Plan and Nutrition Goals:   Nutrition Assessments:  Nutrition Assessments - 08/03/19 0941      MEDFICTS Scores   Pre Score 3           MEDIFICTS Score Key:          ?70 Need to make dietary changes          40-70 Heart Healthy Diet         ? 40 Therapeutic Level Cholesterol Diet  Nutrition Goals Re-Evaluation:   Nutrition Goals Discharge (Final Nutrition Goals  Re-Evaluation):   Psychosocial: Target Goals: Acknowledge presence or absence of significant depression and/or stress, maximize coping skills, provide positive support system. Participant is able to verbalize types and ability to use techniques and skills needed for reducing stress and depression.   Education: Depression - Provides group verbal and written instruction on the correlation between heart/lung disease and depressed mood, treatment options, and the stigmas associated with seeking treatment.   Education: Sleep Hygiene -Provides group verbal and written instruction about how sleep can affect your health.  Define sleep hygiene, discuss sleep cycles and impact of sleep habits. Review good sleep hygiene tips.     Education: Stress and Anxiety: - Provides group verbal and written instruction about the health risks of elevated stress and causes of high stress.  Discuss the correlation between heart/lung disease and anxiety and treatment options. Review healthy ways to manage with stress and anxiety.    Initial Review & Psychosocial Screening:  Initial Psych Review & Screening - 07/15/19 1117      Initial Review   Current issues with Current Sleep Concerns  Family Dynamics   Good Support System? Yes    Comments He is not sleeping well due to having some pain from his procedure. He can look to his girlfriend, children and grand children. He runs a custom swimming pool company.      Barriers   Psychosocial barriers to participate in program There are no identifiable barriers or psychosocial needs.;The patient should benefit from training in stress management and relaxation.      Screening Interventions   Interventions Encouraged to exercise;Provide feedback about the scores to participant;Program counselor consult;To provide support and resources with identified psychosocial needs    Expected Outcomes Short Term goal: Utilizing psychosocial counselor, staff and physician to  assist with identification of specific Stressors or current issues interfering with healing process. Setting desired goal for each stressor or current issue identified.;Long Term Goal: Stressors or current issues are controlled or eliminated.;Short Term goal: Identification and review with participant of any Quality of Life or Depression concerns found by scoring the questionnaire.;Long Term goal: The participant improves quality of Life and PHQ9 Scores as seen by post scores and/or verbalization of changes           Quality of Life Scores:   Quality of Life - 08/03/19 0940      Quality of Life   Select Quality of Life      Quality of Life Scores   Health/Function Pre 25.63 %    Socioeconomic Pre 23.62 %    Psych/Spiritual Pre 27.85 %    Family Pre 18.6 %    GLOBAL Pre 24.61 %          Scores of 19 and below usually indicate a poorer quality of life in these areas.  A difference of  2-3 points is a clinically meaningful difference.  A difference of 2-3 points in the total score of the Quality of Life Index has been associated with significant improvement in overall quality of life, self-image, physical symptoms, and general health in studies assessing change in quality of life.  PHQ-9: Recent Review Flowsheet Data    Depression screen [facility] 2/9 08/03/2019   Decreased Interest 0   Down, Depressed, Hopeless 0   PHQ - 2 Score 0   Altered sleeping 2   Tired, decreased energy 0   Change in appetite 0   Feeling bad or failure about yourself  0   Trouble concentrating 0   Moving slowly or fidgety/restless 0   Suicidal thoughts 0   PHQ-9 Score 2   Difficult doing work/chores Not difficult at all     Interpretation of Total Score  Total Score Depression Severity:  1-4 = Minimal depression, 5-9 = Mild depression, 10-14 = Moderate depression, 15-19 = Moderately severe depression, 20-27 = Severe depression   Psychosocial Evaluation and Intervention:  Psychosocial Evaluation - 07/15/19  1119      Psychosocial Evaluation & Interventions   Interventions Encouraged to exercise with the program and follow exercise prescription    Comments He is not sleeping well due to having some pain from his procedure. He can look to his girlfriend, children and grand children. He runs a custom swimming pool company.    Expected Outcomes Short: Exercise regularly to support mental health and notify staff of any changes. Long: maintain mental health and well being through teaching of rehab or prescribed medications independently.    Continue Psychosocial Services  Follow up required by staff           Psychosocial Re-Evaluation:  Psychosocial Discharge (Final Psychosocial Re-Evaluation):   Vocational Rehabilitation: Provide vocational rehab assistance to qualifying candidates.   Vocational Rehab Evaluation & Intervention:   Education: Education Goals: Education classes will be provided on a variety of topics geared toward better understanding of heart health and risk factor modification. Participant will state understanding/return demonstration of topics presented as noted by education test scores.  Learning Barriers/Preferences:  Learning Barriers/Preferences - 07/15/19 1113      Learning Barriers/Preferences   Learning Barriers None    Learning Preferences None           General Cardiac Education Topics:  AED/CPR: - Group verbal and written instruction with the use of models to demonstrate the basic use of the AED with the basic ABC's of resuscitation.   Anatomy & Physiology of the Heart: - Group verbal and written instruction and models provide basic cardiac anatomy and physiology, with the coronary electrical and arterial systems. Review of Valvular disease and Heart Failure   Cardiac Rehab from 08/03/2019 in [facility] Cardiac and Pulmonary Rehab  Date 08/03/19  Instruction Review Code 3- Needs Reinforcement  [need identified]      Cardiac Procedures: - Group verbal  and written instruction to review commonly prescribed medications for heart disease. Reviews the medication, class of the drug, and side effects. Includes the steps to properly store meds and maintain the prescription regimen. (beta blockers and nitrates)   Cardiac Medications I: - Group verbal and written instruction to review commonly prescribed medications for heart disease. Reviews the medication, class of the drug, and side effects. Includes the steps to properly store meds and maintain the prescription regimen.   Cardiac Medications II: -Group verbal and written instruction to review commonly prescribed medications for heart disease. Reviews the medication, class of the drug, and side effects. (all other drug classes)    Go Sex-Intimacy & Heart Disease, Get SMART - Goal Setting: - Group verbal and written instruction through game format to discuss heart disease and the return to sexual intimacy. Provides group verbal and written material to discuss and apply goal setting through the application of the S.M.A.R.T. Method.   Other Matters of the Heart: - Provides group verbal, written materials and models to describe Stable Angina and Peripheral Artery. Includes description of the disease process and treatment options available to the cardiac patient.   Infection Prevention: - Provides verbal and written material to individual with discussion of infection control including proper hand washing and proper equipment cleaning during exercise session.   Cardiac Rehab from 08/03/2019 in [facility] Cardiac and Pulmonary Rehab  Date 08/03/19  Educator [facility]  Instruction Review Code 1- Verbalizes Understanding      Falls Prevention: - Provides verbal and written material to individual with discussion of falls prevention and safety.   Cardiac Rehab from 08/03/2019 in [facility] Cardiac and Pulmonary Rehab  Date 08/03/19  Educator [facility]  Instruction Review Code 1- Verbalizes Understanding       Other: -Provides group and verbal instruction on various topics (see comments)   Knowledge Questionnaire Score:  Knowledge Questionnaire Score - 08/03/19 0941      Knowledge Questionnaire Score   Pre Score 24/26 Education Focus: Angina, [place]           Core Components/Risk Factors/Patient Goals at Admission:  Personal Goals and Risk Factors at Admission - 08/03/19 0942      Core Components/Risk Factors/Patient Goals on Admission    Weight Management Yes;Weight Loss;Obesity    Intervention Weight Management: Develop a combined  nutrition and exercise program designed to reach desired caloric intake, while maintaining appropriate intake of nutrient and fiber, sodium and fats, and appropriate energy expenditure required for the weight goal.;Weight Management: Provide education and appropriate resources to help participant work on and attain dietary goals.;Weight Management/Obesity: Establish reasonable short term and long term weight goals.;Obesity: Provide education and appropriate resources to help participant work on and attain dietary goals.    Admit Weight 212 lb 6.4 oz (96.3 kg)    Goal Weight: Short Term 207 lb (93.9 kg)    Goal Weight: Long Term 200 lb (90.7 kg)    Expected Outcomes Short Term: Continue to assess and modify interventions until short term weight is achieved;Long Term: Adherence to nutrition and physical activity/exercise program aimed toward attainment of established weight goal;Weight Loss: Understanding of general recommendations for a balanced deficit meal plan, which promotes 1-2 lb weight loss per week and includes a negative energy balance of [phone] kcal/d;Understanding recommendations for meals to include 15-35% energy as protein, 25-35% energy from fat, 35-60% energy from carbohydrates, less than 214m of dietary cholesterol, 20-35 gm of total fiber daily    Diabetes Yes    Intervention Provide education about signs/symptoms and action to take for  hypo/hyperglycemia.;Provide education about proper nutrition, including hydration, and aerobic/resistive exercise prescription along with prescribed medications to achieve blood glucose in normal ranges: Fasting glucose 65-99 mg/dL    Expected Outcomes Long Term: Attainment of HbA1C < 7%.;Short Term: Participant verbalizes understanding of the signs/symptoms and immediate care of hyper/hypoglycemia, proper foot care and importance of medication, aerobic/resistive exercise and nutrition plan for blood glucose control.    Hypertension Yes    Intervention Provide education on lifestyle modifcations including regular physical activity/exercise, weight management, moderate sodium restriction and increased consumption of fresh fruit, vegetables, and low fat dairy, alcohol moderation, and smoking cessation.;Monitor prescription use compliance.    Expected Outcomes Short Term: Continued assessment and intervention until BP is < 140/948mHG in hypertensive participants. < 130/8074mG in hypertensive participants with diabetes, heart failure or chronic kidney disease.;Long Term: Maintenance of blood pressure at goal levels.    Lipids Yes    Intervention Provide education and support for participant on nutrition & aerobic/resistive exercise along with prescribed medications to achieve LDL <63m21mDL >40mg7m Expected Outcomes Short Term: Participant states understanding of desired cholesterol values and is compliant with medications prescribed. Participant is following exercise prescription and nutrition guidelines.;Long Term: Cholesterol controlled with medications as prescribed, with individualized exercise RX and with personalized nutrition plan. Value goals: LDL < 63mg,58m > 40 mg.           Education:Diabetes - Individual verbal and written instruction to review signs/symptoms of diabetes, desired ranges of glucose level fasting, after meals and with exercise. Acknowledge that pre and post exercise glucose  checks will be done for 3 sessions at entry of program.   [facility]  Date 08/03/19  Educator JH  [facility] 1- Verbalizes Understanding      Education: Know Your Numbers and Risk Factors: -Group verbal and written instruction about important numbers in your health.  Discussion of what are risk factors and how they play a role in the disease process.  Review of Cholesterol, Blood Pressure, Diabetes, and BMI and the role they play in your overall health.   Core Components/Risk Factors/Patient Goals Review:    Core Components/Risk Factors/Patient Goals at Discharge (Final Review):  ITP Comments:  ITP Comments    Row Name 07/15/19 1122 08/03/19 0936 08/11/19 0818 08/19/19 0626 08/19/19 1142   ITP Comments Virtual Visit completed. Patient informed on EP and RD appointment and 6 Minute walk test. Patient also informed of patient health questionnaires on My Chart. Patient Verbalizes understanding. Visit diagnosis can be found in [facility]6/22/2021. Completed 6MWT and gym orientation. Initial ITP created and sent for review to Dr. Emily Filbert, Medical Director. First full day of exercise!  Patient was oriented to gym and equipment including functions, settings, policies, and procedures.  Patient's individual exercise prescription and treatment plan were reviewed.  All starting workloads were established based on the results of the 6 minute walk test done at initial orientation visit.  The plan for exercise progression was also introduced and progression will be customized based on patient's performance and goals. 30 Day review completed. Medical Director ITP review done, changes made as directed, and signed approval by Medical Director. Patient called and stated he is not comfortable coming to cardiac rehab anymore due to increase in COVID-19 cases. Patient would like to be discharged at this time and will call back if he would like to  re-start.          Comments: Discharge ITP

## 2019-08-19 NOTE — Telephone Encounter (Signed)
Patient called and stated he is not comfortable coming to cardiac rehab anymore due to increase in COVID-19 cases. Patient would like to be discharged at this time and will call back if he would like to re-start.

## 2019-08-19 NOTE — Progress Notes (Signed)
Cardiac Individual Treatment Plan  Patient Details  Name: Loel Betancur MRN: [id number] Date of Birth: 11-26-1950 Referring Provider:     Cardiac Rehab from 08/03/2019 in [facility] Cardiac and Pulmonary Rehab  Referring Provider Skeet Latch MD      Initial Encounter Date:    Cardiac Rehab from 08/03/2019 in [facility] Cardiac and Pulmonary Rehab  Date 08/03/19      Visit Diagnosis: S/P CABG x 4  Patient's Home Medications on Admission:  Current Outpatient Medications:  .  aspirin EC 81 MG tablet, Take 81 mg by mouth daily. Swallow whole., Disp: , Rfl:  .  carvedilol (COREG) 6.25 MG tablet, Take 1 tablet (6.25 mg total) by mouth 2 (two) times daily with a meal., Disp: 60 tablet, Rfl: 1 .  metFORMIN (GLUCOPHAGE) 500 MG tablet, Take 1 tablet (500 mg total) by mouth 2 (two) times daily with a meal., Disp: 60 tablet, Rfl: 1 .  Multiple Vitamins-Minerals (MULTIVITAMIN WITH MINERALS) tablet, Take 1 tablet by mouth daily., Disp: , Rfl:  .  rosuvastatin (CRESTOR) 40 MG tablet, Take 1 tablet (40 mg total) by mouth at bedtime., Disp: 90 tablet, Rfl: 1  Past Medical History: Past Medical History:  Diagnosis Date  . Aortic stenosis    mild to moderate AS 05/2019 echo   . CAD in native artery 06/09/2019  . Diabetes mellitus type 2 in obese ([place]) 06/09/2019  . Essential hypertension 06/09/2019  . Melanoma ([place])    back  . Pure hypercholesterolemia 06/09/2019    Tobacco Use: Social History   Tobacco Use  Smoking Status Former Smoker  . Packs/day: 2.00  . Years: 15.00  . Pack years: 30.00  . Types: Cigarettes  . Quit date: 67  . Years since quitting: 42.6  Smokeless Tobacco Never Used    Labs: Recent Chemical engineer    Labs for ITP Cardiac and Pulmonary Rehab Latest Ref Rng & Units 07/04/2019 07/04/2019 07/05/2019 07/06/2019 08/18/2019   Cholestrol 100 - 199 mg/dL - - - - 112   LDLCALC 0 - 99 mg/dL - - - - 41   HDL >39 mg/dL - - - - 54   Trlycerides 0 - 149 mg/dL - - - - 87    Hemoglobin A1c 4.8 - 5.6 % - - - - -   PHART 7.35 - 7.45 - - - - -   PCO2ART 32 - 48 mmHg - - - - -   HCO3 20.0 - 28.0 mmol/L - - - - -   TCO2 22 - 32 mmol/L - - - - -   ACIDBASEDEF 0.0 - 2.0 mmol/L - - - - -   O2SAT % 39.3 46.7 55.7 53.1 -       Exercise Target Goals: Exercise Program Goal: Individual exercise prescription set using results from initial 6 min walk test and THRR while considering  patient's activity barriers and safety.   Exercise Prescription Goal: Initial exercise prescription builds to 30-45 minutes a day of aerobic activity, 2-3 days per week.  Home exercise guidelines will be given to patient during program as part of exercise prescription that the participant will acknowledge.   Education: Aerobic Exercise & Resistance Training: - Gives group verbal and written instruction on the various components of exercise. Focuses on aerobic and resistive training programs and the benefits of this training and how to safely progress through these programs..   Education: Exercise & Equipment Safety: - Individual verbal instruction and demonstration of equipment use and safety with use  of the equipment.   Cardiac Rehab from 08/03/2019 in [facility] Cardiac and Pulmonary Rehab  Date 08/03/19  Educator [facility]  Instruction Review Code 1- Verbalizes Understanding      Education: Exercise Physiology & General Exercise Guidelines: - Group verbal and written instruction with models to review the exercise physiology of the cardiovascular system and associated critical values. Provides general exercise guidelines with specific guidelines to those with heart or lung disease.    Education: Flexibility, Balance, Mind/Body Relaxation: Provides group verbal/written instruction on the benefits of flexibility and balance training, including mind/body exercise modes such as yoga, pilates and tai chi.  Demonstration and skill practice provided.   Activity Barriers & Risk Stratification:   Activity Barriers & Cardiac Risk Stratification - 08/03/19 0937      Activity Barriers & Cardiac Risk Stratification   Activity Barriers Other (comment)    Comments sternal restrictions only    Cardiac Risk Stratification High           6 Minute Walk:  6 Minute Walk    Row Name 08/03/19 0937         6 Minute Walk   Phase Initial     Distance 1395 feet     Walk Time 6 minutes     # of Rest Breaks 0     MPH 2.64     METS 2.94     RPE 9     VO2 Peak 10.28     Symptoms No     Resting HR 68 bpm     Resting BP 124/66     Resting Oxygen Saturation  96 %     Exercise Oxygen Saturation  during 6 min walk 96 %     Max Ex. HR 103 bpm     Max Ex. BP 130/68     2 Minute Post BP 126/64            Oxygen Initial Assessment:   Oxygen Re-Evaluation:   Oxygen Discharge (Final Oxygen Re-Evaluation):   Initial Exercise Prescription:  Initial Exercise Prescription - 08/03/19 0900      Date of Initial Exercise RX and Referring Provider   Date 08/03/19    Referring Provider Skeet Latch MD      Treadmill   MPH 2.5    Grade 1    Minutes 15    METs 3.26      Recumbant Bike   Level 3    RPM 50    Watts 30    Minutes 15    METs 3      NuStep   Level 3    SPM 80    Minutes 15    METs 3      REL-XR   Level 2    Speed 50    Minutes 15    METs 3      Prescription Details   Frequency (times per week) 2    Duration Progress to 30 minutes of continuous aerobic without signs/symptoms of physical distress      Intensity   THRR 40-80% of Max Heartrate 102-135    Ratings of Perceived Exertion 11-13    Perceived Dyspnea 0-4      Progression   Progression Continue to progress workloads to maintain intensity without signs/symptoms of physical distress.      Resistance Training   Training Prescription Yes    Weight 5 lb    Reps 10-15  Perform Capillary Blood Glucose checks as needed.  Exercise Prescription Changes:  Exercise Prescription  Changes    Row Name 08/03/19 0900 08/18/19 1300           Response to Exercise   Blood Pressure (Admit) 124/66 108/66      Blood Pressure (Exercise) 130/68 144/78      Blood Pressure (Exit) 126/64 122/66      Heart Rate (Admit) 68 bpm 91 bpm      Heart Rate (Exercise) 103 bpm 104 bpm      Heart Rate (Exit) 66 bpm 79 bpm      Oxygen Saturation (Admit) 96 % --      Oxygen Saturation (Exercise) 96 % --      Rating of Perceived Exertion (Exercise) 9 10      Symptoms none none      Comments walk test results first day        Progression   Progression -- Continue to progress workloads to maintain intensity without signs/symptoms of physical distress.      Average METs -- 2.5        Resistance Training   Training Prescription -- Yes      Weight -- 5 lb      Reps -- 10-15        Interval Training   Interval Training -- No        Recumbant Bike   Level -- 3      RPM -- 50      Watts -- 19      Minutes -- 15      METs -- 2.6        NuStep   Level -- 3      SPM -- 80      Minutes -- 15      METs -- 2.5             Exercise Comments:  Exercise Comments    Row Name 08/11/19 0818           Exercise Comments First full day of exercise!  Patient was oriented to gym and equipment including functions, settings, policies, and procedures.  Patient's individual exercise prescription and treatment plan were reviewed.  All starting workloads were established based on the results of the 6 minute walk test done at initial orientation visit.  The plan for exercise progression was also introduced and progression will be customized based on patient's performance and goals.              Exercise Goals and Review:  Exercise Goals    Row Name 08/03/19 0939             Exercise Goals   Increase Physical Activity Yes       Intervention Develop an individualized exercise prescription for aerobic and resistive training based on initial evaluation findings, risk stratification,  comorbidities and participant's personal goals.;Provide advice, education, support and counseling about physical activity/exercise needs.       Expected Outcomes Short Term: Attend rehab on a regular basis to increase amount of physical activity.;Long Term: Add in home exercise to make exercise part of routine and to increase amount of physical activity.;Long Term: Exercising regularly at least 3-5 days a week.       Increase Strength and Stamina Yes       Intervention Provide advice, education, support and counseling about physical activity/exercise needs.;Develop an individualized exercise prescription for aerobic and resistive training based on initial evaluation findings,  risk stratification, comorbidities and participant's personal goals.       Expected Outcomes Short Term: Increase workloads from initial exercise prescription for resistance, speed, and METs.;Short Term: Perform resistance training exercises routinely during rehab and add in resistance training at home;Long Term: Improve cardiorespiratory fitness, muscular endurance and strength as measured by increased METs and functional capacity (6MWT)       Able to understand and use rate of perceived exertion (RPE) scale Yes       Intervention Provide education and explanation on how to use RPE scale       Expected Outcomes Short Term: Able to use RPE daily in rehab to express subjective intensity level;Long Term:  Able to use RPE to guide intensity level when exercising independently       Able to understand and use Dyspnea scale Yes       Intervention Provide education and explanation on how to use Dyspnea scale       Expected Outcomes Short Term: Able to use Dyspnea scale daily in rehab to express subjective sense of shortness of breath during exertion;Long Term: Able to use Dyspnea scale to guide intensity level when exercising independently       Knowledge and understanding of Target Heart Rate Range (THRR) Yes       Intervention Provide  education and explanation of THRR including how the numbers were predicted and where they are located for reference       Expected Outcomes Short Term: Able to state/look up THRR;Short Term: Able to use daily as guideline for intensity in rehab;Long Term: Able to use THRR to govern intensity when exercising independently       Able to check pulse independently Yes       Intervention Provide education and demonstration on how to check pulse in carotid and radial arteries.       Expected Outcomes Short Term: Able to explain why pulse checking is important during independent exercise;Long Term: Able to check pulse independently and accurately       Understanding of Exercise Prescription Yes       Intervention Provide education, explanation, and written materials on patient's individual exercise prescription       Expected Outcomes Short Term: Able to explain program exercise prescription;Long Term: Able to explain home exercise prescription to exercise independently              Exercise Goals Re-Evaluation :  Exercise Goals Re-Evaluation    Row Name 08/11/19 0818             Exercise Goal Re-Evaluation   Exercise Goals Review Able to understand and use rate of perceived exertion (RPE) scale;Knowledge and understanding of Target Heart Rate Range (THRR);Understanding of Exercise Prescription       Comments Reviewed RPE and dyspnea scales, THR and program prescription with pt today.  Pt voiced understanding and was given a copy of goals to take home.       Expected Outcomes Short: Use RPE daily to regulate intensity. Long: Follow program prescription in THR.              Discharge Exercise Prescription (Final Exercise Prescription Changes):  Exercise Prescription Changes - 08/18/19 1300      Response to Exercise   Blood Pressure (Admit) 108/66    Blood Pressure (Exercise) 144/78    Blood Pressure (Exit) 122/66    Heart Rate (Admit) 91 bpm    Heart Rate (Exercise) 104 bpm    Heart  Rate (  Exit) 79 bpm    Rating of Perceived Exertion (Exercise) 10    Symptoms none    Comments first day      Progression   Progression Continue to progress workloads to maintain intensity without signs/symptoms of physical distress.    Average METs 2.5      Resistance Training   Training Prescription Yes    Weight 5 lb    Reps 10-15      Interval Training   Interval Training No      Recumbant Bike   Level 3    RPM 50    Watts 19    Minutes 15    METs 2.6      NuStep   Level 3    SPM 80    Minutes 15    METs 2.5           Nutrition:  Target Goals: Understanding of nutrition guidelines, daily intake of sodium <1516m, cholesterol <2040m calories 30% from fat and 7% or less from saturated fats, daily to have 5 or more servings of fruits and vegetables.  Education: Controlling Sodium/Reading Food Labels -Group verbal and written material supporting the discussion of sodium use in heart healthy nutrition. Review and explanation with models, verbal and written materials for utilization of the food label.   Education: General Nutrition Guidelines/Fats and Fiber: -Group instruction provided by verbal, written material, models and posters to present the general guidelines for heart healthy nutrition. Gives an explanation and review of dietary fats and fiber.   Biometrics:  Pre Biometrics - 08/03/19 0940      Pre Biometrics   Height 5' 8.2" (1.732 m)    Weight 212 lb 6.4 oz (96.3 kg)    BMI (Calculated) 32.12    Single Leg Stand 29.25 seconds            Nutrition Therapy Plan and Nutrition Goals:   Nutrition Assessments:  Nutrition Assessments - 08/03/19 0941      MEDFICTS Scores   Pre Score 3           MEDIFICTS Score Key:          ?70 Need to make dietary changes          40-70 Heart Healthy Diet         ? 40 Therapeutic Level Cholesterol Diet  Nutrition Goals Re-Evaluation:   Nutrition Goals Discharge (Final Nutrition Goals  Re-Evaluation):   Psychosocial: Target Goals: Acknowledge presence or absence of significant depression and/or stress, maximize coping skills, provide positive support system. Participant is able to verbalize types and ability to use techniques and skills needed for reducing stress and depression.   Education: Depression - Provides group verbal and written instruction on the correlation between heart/lung disease and depressed mood, treatment options, and the stigmas associated with seeking treatment.   Education: Sleep Hygiene -Provides group verbal and written instruction about how sleep can affect your health.  Define sleep hygiene, discuss sleep cycles and impact of sleep habits. Review good sleep hygiene tips.     Education: Stress and Anxiety: - Provides group verbal and written instruction about the health risks of elevated stress and causes of high stress.  Discuss the correlation between heart/lung disease and anxiety and treatment options. Review healthy ways to manage with stress and anxiety.    Initial Review & Psychosocial Screening:  Initial Psych Review & Screening - 07/15/19 1117      Initial Review   Current issues with Current Sleep Concerns  Family Dynamics   Good Support System? Yes    Comments He is not sleeping well due to having some pain from his procedure. He can look to his girlfriend, children and grand children. He runs a custom swimming pool company.      Barriers   Psychosocial barriers to participate in program There are no identifiable barriers or psychosocial needs.;The patient should benefit from training in stress management and relaxation.      Screening Interventions   Interventions Encouraged to exercise;Provide feedback about the scores to participant;Program counselor consult;To provide support and resources with identified psychosocial needs    Expected Outcomes Short Term goal: Utilizing psychosocial counselor, staff and physician to  assist with identification of specific Stressors or current issues interfering with healing process. Setting desired goal for each stressor or current issue identified.;Long Term Goal: Stressors or current issues are controlled or eliminated.;Short Term goal: Identification and review with participant of any Quality of Life or Depression concerns found by scoring the questionnaire.;Long Term goal: The participant improves quality of Life and PHQ9 Scores as seen by post scores and/or verbalization of changes           Quality of Life Scores:   Quality of Life - 08/03/19 0940      Quality of Life   Select Quality of Life      Quality of Life Scores   Health/Function Pre 25.63 %    Socioeconomic Pre 23.62 %    Psych/Spiritual Pre 27.85 %    Family Pre 18.6 %    GLOBAL Pre 24.61 %          Scores of 19 and below usually indicate a poorer quality of life in these areas.  A difference of  2-3 points is a clinically meaningful difference.  A difference of 2-3 points in the total score of the Quality of Life Index has been associated with significant improvement in overall quality of life, self-image, physical symptoms, and general health in studies assessing change in quality of life.  PHQ-9: Recent Review Flowsheet Data    Depression screen [facility] 2/9 08/03/2019   Decreased Interest 0   Down, Depressed, Hopeless 0   PHQ - 2 Score 0   Altered sleeping 2   Tired, decreased energy 0   Change in appetite 0   Feeling bad or failure about yourself  0   Trouble concentrating 0   Moving slowly or fidgety/restless 0   Suicidal thoughts 0   PHQ-9 Score 2   Difficult doing work/chores Not difficult at all     Interpretation of Total Score  Total Score Depression Severity:  1-4 = Minimal depression, 5-9 = Mild depression, 10-14 = Moderate depression, 15-19 = Moderately severe depression, 20-27 = Severe depression   Psychosocial Evaluation and Intervention:  Psychosocial Evaluation - 07/15/19  1119      Psychosocial Evaluation & Interventions   Interventions Encouraged to exercise with the program and follow exercise prescription    Comments He is not sleeping well due to having some pain from his procedure. He can look to his girlfriend, children and grand children. He runs a custom swimming pool company.    Expected Outcomes Short: Exercise regularly to support mental health and notify staff of any changes. Long: maintain mental health and well being through teaching of rehab or prescribed medications independently.    Continue Psychosocial Services  Follow up required by staff           Psychosocial Re-Evaluation:  Psychosocial Discharge (Final Psychosocial Re-Evaluation):   Vocational Rehabilitation: Provide vocational rehab assistance to qualifying candidates.   Vocational Rehab Evaluation & Intervention:   Education: Education Goals: Education classes will be provided on a variety of topics geared toward better understanding of heart health and risk factor modification. Participant will state understanding/return demonstration of topics presented as noted by education test scores.  Learning Barriers/Preferences:  Learning Barriers/Preferences - 07/15/19 1113      Learning Barriers/Preferences   Learning Barriers None    Learning Preferences None           General Cardiac Education Topics:  AED/CPR: - Group verbal and written instruction with the use of models to demonstrate the basic use of the AED with the basic ABC's of resuscitation.   Anatomy & Physiology of the Heart: - Group verbal and written instruction and models provide basic cardiac anatomy and physiology, with the coronary electrical and arterial systems. Review of Valvular disease and Heart Failure   Cardiac Rehab from 08/03/2019 in [facility] Cardiac and Pulmonary Rehab  Date 08/03/19  Instruction Review Code 3- Needs Reinforcement  [need identified]      Cardiac Procedures: - Group verbal  and written instruction to review commonly prescribed medications for heart disease. Reviews the medication, class of the drug, and side effects. Includes the steps to properly store meds and maintain the prescription regimen. (beta blockers and nitrates)   Cardiac Medications I: - Group verbal and written instruction to review commonly prescribed medications for heart disease. Reviews the medication, class of the drug, and side effects. Includes the steps to properly store meds and maintain the prescription regimen.   Cardiac Medications II: -Group verbal and written instruction to review commonly prescribed medications for heart disease. Reviews the medication, class of the drug, and side effects. (all other drug classes)    Go Sex-Intimacy & Heart Disease, Get SMART - Goal Setting: - Group verbal and written instruction through game format to discuss heart disease and the return to sexual intimacy. Provides group verbal and written material to discuss and apply goal setting through the application of the S.M.A.R.T. Method.   Other Matters of the Heart: - Provides group verbal, written materials and models to describe Stable Angina and Peripheral Artery. Includes description of the disease process and treatment options available to the cardiac patient.   Infection Prevention: - Provides verbal and written material to individual with discussion of infection control including proper hand washing and proper equipment cleaning during exercise session.   Cardiac Rehab from 08/03/2019 in [facility] Cardiac and Pulmonary Rehab  Date 08/03/19  Educator [facility]  Instruction Review Code 1- Verbalizes Understanding      Falls Prevention: - Provides verbal and written material to individual with discussion of falls prevention and safety.   Cardiac Rehab from 08/03/2019 in [facility] Cardiac and Pulmonary Rehab  Date 08/03/19  Educator [facility]  Instruction Review Code 1- Verbalizes Understanding       Other: -Provides group and verbal instruction on various topics (see comments)   Knowledge Questionnaire Score:  Knowledge Questionnaire Score - 08/03/19 0941      Knowledge Questionnaire Score   Pre Score 24/26 Education Focus: Angina, [place]           Core Components/Risk Factors/Patient Goals at Admission:  Personal Goals and Risk Factors at Admission - 08/03/19 0942      Core Components/Risk Factors/Patient Goals on Admission    Weight Management Yes;Weight Loss;Obesity    Intervention Weight Management: Develop a combined  nutrition and exercise program designed to reach desired caloric intake, while maintaining appropriate intake of nutrient and fiber, sodium and fats, and appropriate energy expenditure required for the weight goal.;Weight Management: Provide education and appropriate resources to help participant work on and attain dietary goals.;Weight Management/Obesity: Establish reasonable short term and long term weight goals.;Obesity: Provide education and appropriate resources to help participant work on and attain dietary goals.    Admit Weight 212 lb 6.4 oz (96.3 kg)    Goal Weight: Short Term 207 lb (93.9 kg)    Goal Weight: Long Term 200 lb (90.7 kg)    Expected Outcomes Short Term: Continue to assess and modify interventions until short term weight is achieved;Long Term: Adherence to nutrition and physical activity/exercise program aimed toward attainment of established weight goal;Weight Loss: Understanding of general recommendations for a balanced deficit meal plan, which promotes 1-2 lb weight loss per week and includes a negative energy balance of [phone] kcal/d;Understanding recommendations for meals to include 15-35% energy as protein, 25-35% energy from fat, 35-60% energy from carbohydrates, less than 227m of dietary cholesterol, 20-35 gm of total fiber daily    Diabetes Yes    Intervention Provide education about signs/symptoms and action to take for  hypo/hyperglycemia.;Provide education about proper nutrition, including hydration, and aerobic/resistive exercise prescription along with prescribed medications to achieve blood glucose in normal ranges: Fasting glucose 65-99 mg/dL    Expected Outcomes Long Term: Attainment of HbA1C < 7%.;Short Term: Participant verbalizes understanding of the signs/symptoms and immediate care of hyper/hypoglycemia, proper foot care and importance of medication, aerobic/resistive exercise and nutrition plan for blood glucose control.    Hypertension Yes    Intervention Provide education on lifestyle modifcations including regular physical activity/exercise, weight management, moderate sodium restriction and increased consumption of fresh fruit, vegetables, and low fat dairy, alcohol moderation, and smoking cessation.;Monitor prescription use compliance.    Expected Outcomes Short Term: Continued assessment and intervention until BP is < 140/912mHG in hypertensive participants. < 130/8010mG in hypertensive participants with diabetes, heart failure or chronic kidney disease.;Long Term: Maintenance of blood pressure at goal levels.    Lipids Yes    Intervention Provide education and support for participant on nutrition & aerobic/resistive exercise along with prescribed medications to achieve LDL <15m55mDL >40mg69m Expected Outcomes Short Term: Participant states understanding of desired cholesterol values and is compliant with medications prescribed. Participant is following exercise prescription and nutrition guidelines.;Long Term: Cholesterol controlled with medications as prescribed, with individualized exercise RX and with personalized nutrition plan. Value goals: LDL < 15mg,39m > 40 mg.           Education:Diabetes - Individual verbal and written instruction to review signs/symptoms of diabetes, desired ranges of glucose level fasting, after meals and with exercise. Acknowledge that pre and post exercise glucose  checks will be done for 3 sessions at entry of program.   [facility]  Date 08/03/19  [facility]- Verbalizes Understanding      Education: Know Your Numbers and Risk Factors: -Group verbal and written instruction about important numbers in your health.  Discussion of what are risk factors and how they play a role in the disease process.  Review of Cholesterol, Blood Pressure, Diabetes, and BMI and the role they play in your overall health.   Core Components/Risk Factors/Patient Goals Review:    Core Components/Risk Factors/Patient Goals at Discharge (Final Review):  ITP Comments:  ITP Comments    Row Name 07/15/19 1122 08/03/19 0936 08/11/19 0818 08/19/19 0626     ITP Comments Virtual Visit completed. Patient informed on EP and RD appointment and 6 Minute walk test. Patient also informed of patient health questionnaires on My Chart. Patient Verbalizes understanding. Visit diagnosis can be found in [facility]6/22/2021. Completed 6MWT and gym orientation. Initial ITP created and sent for review to Dr. Emily Filbert, Medical Director. First full day of exercise!  Patient was oriented to gym and equipment including functions, settings, policies, and procedures.  Patient's individual exercise prescription and treatment plan were reviewed.  All starting workloads were established based on the results of the 6 minute walk test done at initial orientation visit.  The plan for exercise progression was also introduced and progression will be customized based on patient's performance and goals. 30 Day review completed. Medical Director ITP review done, changes made as directed, and signed approval by Medical Director.           Comments:

## 2019-08-24 ENCOUNTER — Encounter: Payer: Self-pay | Admitting: Ophthalmology

## 2019-08-28 ENCOUNTER — Other Ambulatory Visit: Payer: Self-pay

## 2019-08-28 ENCOUNTER — Other Ambulatory Visit
Admission: RE | Admit: 2019-08-28 | Discharge: 2019-08-28 | Disposition: A | Discharge status: Home / Self Care | Payer: Medicare Other | Source: Ambulatory Visit | Attending: Ophthalmology | Admitting: Ophthalmology

## 2019-08-28 DIAGNOSIS — Z01812 Encounter for preprocedural laboratory examination: Secondary | ICD-10-CM | POA: Diagnosis present

## 2019-08-28 DIAGNOSIS — Z20822 Contact with and (suspected) exposure to covid-19: Secondary | ICD-10-CM | POA: Insufficient documentation

## 2019-08-28 LAB — SARS CORONAVIRUS 2 (TAT 6-24 HRS): SARS Coronavirus 2: NEGATIVE

## 2019-08-28 NOTE — Discharge Instructions (Signed)

## 2019-08-31 ENCOUNTER — Other Ambulatory Visit: Payer: Self-pay | Admitting: Physician Assistant

## 2019-09-01 ENCOUNTER — Ambulatory Visit
Admission: RE | Admit: 2019-09-01 | Discharge: 2019-09-01 | Disposition: A | Discharge status: Home / Self Care | Payer: Medicare Other | Attending: Ophthalmology | Admitting: Ophthalmology

## 2019-09-01 ENCOUNTER — Other Ambulatory Visit: Payer: Self-pay

## 2019-09-01 ENCOUNTER — Encounter: Payer: Self-pay | Admitting: Ophthalmology

## 2019-09-01 ENCOUNTER — Ambulatory Visit: Payer: Medicare Other | Admitting: Anesthesiology

## 2019-09-01 ENCOUNTER — Encounter
Admission: RE | Disposition: A | Discharge status: Home / Self Care | Payer: Self-pay | Source: Home / Self Care | Attending: Ophthalmology

## 2019-09-01 DIAGNOSIS — Z87891 Personal history of nicotine dependence: Secondary | ICD-10-CM | POA: Insufficient documentation

## 2019-09-01 DIAGNOSIS — Z7984 Long term (current) use of oral hypoglycemic drugs: Secondary | ICD-10-CM | POA: Diagnosis not present

## 2019-09-01 DIAGNOSIS — E119 Type 2 diabetes mellitus without complications: Secondary | ICD-10-CM | POA: Diagnosis not present

## 2019-09-01 DIAGNOSIS — H2512 Age-related nuclear cataract, left eye: Secondary | ICD-10-CM | POA: Diagnosis present

## 2019-09-01 DIAGNOSIS — Z85820 Personal history of malignant melanoma of skin: Secondary | ICD-10-CM | POA: Insufficient documentation

## 2019-09-01 DIAGNOSIS — Z79899 Other long term (current) drug therapy: Secondary | ICD-10-CM | POA: Diagnosis not present

## 2019-09-01 DIAGNOSIS — Z85038 Personal history of other malignant neoplasm of large intestine: Secondary | ICD-10-CM | POA: Insufficient documentation

## 2019-09-01 DIAGNOSIS — I2510 Atherosclerotic heart disease of native coronary artery without angina pectoris: Secondary | ICD-10-CM | POA: Insufficient documentation

## 2019-09-01 DIAGNOSIS — Z951 Presence of aortocoronary bypass graft: Secondary | ICD-10-CM | POA: Insufficient documentation

## 2019-09-01 DIAGNOSIS — I10 Essential (primary) hypertension: Secondary | ICD-10-CM | POA: Diagnosis not present

## 2019-09-01 HISTORY — PX: CATARACT EXTRACTION W/PHACO: SHX586

## 2019-09-01 LAB — GLUCOSE, CAPILLARY
Glucose-Capillary: 153 mg/dL — ABNORMAL HIGH (ref 70–99)
Glucose-Capillary: 158 mg/dL — ABNORMAL HIGH (ref 70–99)

## 2019-09-01 SURGERY — PHACOEMULSIFICATION, CATARACT, WITH IOL INSERTION
Anesthesia: Monitor Anesthesia Care | Site: Eye | Laterality: Left

## 2019-09-01 MED ORDER — BRIMONIDINE TARTRATE-TIMOLOL 0.2-0.5 % OP SOLN
OPHTHALMIC | Status: DC | PRN
  Administered 2019-09-01: 1 [drp] via OPHTHALMIC

## 2019-09-01 MED ORDER — MIDAZOLAM HCL 2 MG/2ML IJ SOLN
INTRAMUSCULAR | Status: DC | PRN
  Administered 2019-09-01: 1 mg via INTRAVENOUS

## 2019-09-01 MED ORDER — MOXIFLOXACIN HCL 0.5 % OP SOLN
OPHTHALMIC | Status: DC | PRN
  Administered 2019-09-01: 0.2 mL via OPHTHALMIC

## 2019-09-01 MED ORDER — FENTANYL CITRATE (PF) 100 MCG/2ML IJ SOLN
INTRAMUSCULAR | Status: DC | PRN
Start: 2019-09-01 — End: 2019-09-01
  Administered 2019-09-01: 50 ug via INTRAVENOUS

## 2019-09-01 MED ORDER — EPINEPHRINE PF 1 MG/ML IJ SOLN
INTRAOCULAR | Status: DC | PRN
  Administered 2019-09-01: 52 mL via OPHTHALMIC

## 2019-09-01 MED ORDER — NA CHONDROIT SULF-NA HYALURON 40-17 MG/ML IO SOLN
INTRAOCULAR | Status: DC | PRN
  Administered 2019-09-01: 1 mL via INTRAOCULAR

## 2019-09-01 MED ORDER — TETRACAINE HCL 0.5 % OP SOLN
1.0000 [drp] | OPHTHALMIC | Status: DC | PRN
  Administered 2019-09-01 (×2): 1 [drp] via OPHTHALMIC

## 2019-09-01 MED ORDER — ARMC OPHTHALMIC DILATING DROPS
1.0000 "application " | OPHTHALMIC | Status: DC | PRN
  Administered 2019-09-01 (×3): 1 via OPHTHALMIC

## 2019-09-01 MED ORDER — ACETAMINOPHEN 160 MG/5ML PO SOLN: 325.0000 mg | Freq: Once | ORAL | Status: DC

## 2019-09-01 MED ORDER — LACTATED RINGERS IV SOLN: INTRAVENOUS | Status: DC

## 2019-09-01 MED ORDER — LIDOCAINE HCL (PF) 2 % IJ SOLN
INTRAOCULAR | Status: DC | PRN
  Administered 2019-09-01: 1 mL

## 2019-09-01 MED ORDER — ACETAMINOPHEN 325 MG PO TABS: 325.0000 mg | ORAL_TABLET | Freq: Once | ORAL | Status: DC

## 2019-09-01 SURGICAL SUPPLY — 19 items
CANNULA ANT/CHMB 27GA (MISCELLANEOUS) ×6 IMPLANT
GLOVE SURG LX 8.0 MICRO (GLOVE) ×4
GLOVE SURG LX STRL 8.0 MICRO (GLOVE) ×2 IMPLANT
GLOVE SURG TRIUMPH 8.0 PF LTX (GLOVE) ×3 IMPLANT
GOWN STRL REUS W/ TWL LRG LVL3 (GOWN DISPOSABLE) ×2 IMPLANT
GOWN STRL REUS W/TWL LRG LVL3 (GOWN DISPOSABLE) ×6
LENS IOL TECNIS EYHANCE 16.0 ×3 IMPLANT
MARKER SKIN DUAL TIP RULER LAB (MISCELLANEOUS) ×3 IMPLANT
NEEDLE FILTER BLUNT 18X 1/2SAF (NEEDLE) ×2
NEEDLE FILTER BLUNT 18X1 1/2 (NEEDLE) ×1 IMPLANT
PACK EYE AFTER SURG (MISCELLANEOUS) ×3 IMPLANT
PACK OPTHALMIC (MISCELLANEOUS) ×3 IMPLANT
PACK PORFILIO (MISCELLANEOUS) ×3 IMPLANT
SUT ETHILON 10-0 CS-B-6CS-B-6 (SUTURE)
SUTURE EHLN 10-0 CS-B-6CS-B-6 (SUTURE) IMPLANT
SYR 3ML LL SCALE MARK (SYRINGE) ×3 IMPLANT
SYR TB 1ML LUER SLIP (SYRINGE) ×3 IMPLANT
WATER STERILE IRR 250ML POUR (IV SOLUTION) ×3 IMPLANT
WIPE NON LINTING 3.25X3.25 (MISCELLANEOUS) ×3 IMPLANT

## 2019-09-01 NOTE — Anesthesia Preprocedure Evaluation (Signed)
Anesthesia Evaluation  Patient identified by MRN, date of birth, ID band Patient awake    Reviewed: Allergy & Precautions, H&P , NPO status , Patient's Chart, lab work & pertinent test results  Airway Mallampati: III  TM Distance: >3 FB Neck ROM: full    Dental no notable dental hx.    Pulmonary former smoker,    Pulmonary exam normal breath sounds clear to auscultation       Cardiovascular hypertension, + CAD and + CABG  + Valvular Problems/Murmurs AS  Rhythm:regular Rate:Normal + Systolic murmurs    Neuro/Psych    GI/Hepatic   Endo/Other  diabetes  Renal/GU      Musculoskeletal   Abdominal   Peds  Hematology   Anesthesia Other Findings   Reproductive/Obstetrics                             Anesthesia Physical Anesthesia Plan  ASA: III  Anesthesia Plan: MAC   Post-op Pain Management:    Induction:   PONV Risk Score and Plan: 1 and Treatment may vary due to age or medical condition, TIVA and Midazolam  Airway Management Planned:   Additional Equipment:   Intra-op Plan:   Post-operative Plan:   Informed Consent: I have reviewed the patients History and Physical, chart, labs and discussed the procedure including the risks, benefits and alternatives for the proposed anesthesia with the patient or authorized representative who has indicated his/her understanding and acceptance.     Dental Advisory Given  Plan Discussed with: CRNA  Anesthesia Plan Comments:         Anesthesia Quick Evaluation

## 2019-09-01 NOTE — Anesthesia Postprocedure Evaluation (Signed)
Anesthesia Post Note  Patient: Phillip [name]  Procedure(s) Performed: CATARACT EXTRACTION PHACO AND INTRAOCULAR LENS PLACEMENT (IOC) LEFT 6.30  00:36.6 (Left Eye)     Patient location during evaluation: PACU Anesthesia Type: MAC Level of consciousness: awake and alert and oriented Pain management: satisfactory to patient Vital Signs Assessment: post-procedure vital signs reviewed and stable Respiratory status: spontaneous breathing, nonlabored ventilation and respiratory function stable Cardiovascular status: blood pressure returned to baseline and stable Postop Assessment: Adequate PO intake and No signs of nausea or vomiting Anesthetic complications: no   No complications documented.  Raliegh Ip

## 2019-09-01 NOTE — Op Note (Signed)
PREOPERATIVE DIAGNOSIS:  Nuclear sclerotic cataract of the left eye.   POSTOPERATIVE DIAGNOSIS:  Nuclear sclerotic cataract of the left eye.   OPERATIVE PROCEDURE:@   SURGEON:  Birder Robson, MD.   ANESTHESIA:  Anesthesiologist: Ronelle Nigh, MD CRNA: Vanetta Shawl, CRNA; Cameron Ali, CRNA  1.      Managed anesthesia care. 2.     0.93ml of Shugarcaine was instilled following the paracentesis   COMPLICATIONS:  None.   TECHNIQUE:   Stop and chop   DESCRIPTION OF PROCEDURE:  The patient was examined and consented in the preoperative holding area where the aforementioned topical anesthesia was applied to the left eye and then brought back to the Operating Room where the left eye was prepped and draped in the usual sterile ophthalmic fashion and a lid speculum was placed. A paracentesis was created with the side port blade and the anterior chamber was filled with viscoelastic. A near clear corneal incision was performed with the steel keratome. A continuous curvilinear capsulorrhexis was performed with a cystotome followed by the capsulorrhexis forceps. Hydrodissection and hydrodelineation were carried out with BSS on a blunt cannula. The lens was removed in a stop and chop  technique and the remaining cortical material was removed with the irrigation-aspiration handpiece. The capsular bag was inflated with viscoelastic and the Technis [id number] lens was placed in the capsular bag without complication. The remaining viscoelastic was removed from the eye with the irrigation-aspiration handpiece. The wounds were hydrated. The anterior chamber was flushed with BSS and the eye was inflated to physiologic pressure. 0.32ml Vigamox was placed in the anterior chamber. The wounds were found to be water tight. The eye was dressed with Combigan. The patient was given protective glasses to wear throughout the day and a shield with which to sleep tonight. The patient was also given drops with which to begin a  drop regimen today and will follow-up with me in one day. Implant Name Type Inv. Item Serial No. Manufacturer Lot No. LRB No. Used Action  LENS II EYHANCE 16.0 - [id number]  LENS II EYHANCE 16.0 [id number] [facility]   Left 1 Wasted  LENS II EYHANCE 16.0 - [id number]  LENS II EYHANCE 16.0 [id number] [employer]   Left 1 Implanted    Procedure(s): CATARACT EXTRACTION PHACO AND INTRAOCULAR LENS PLACEMENT (IOC) LEFT 6.30  00:36.6 (Left)  Electronically signed: Birder Robson 09/01/2019 9:55 AM

## 2019-09-01 NOTE — Anesthesia Procedure Notes (Signed)
Procedure Name: MAC Date/Time: 09/01/2019 9:35 AM Performed by: Cameron Ali, CRNA Pre-anesthesia Checklist: Patient identified, Emergency Drugs available, Suction available, Timeout performed and Patient being monitored Patient Re-evaluated:Patient Re-evaluated prior to induction Oxygen Delivery Method: Nasal cannula Placement Confirmation: positive ETCO2

## 2019-09-01 NOTE — H&P (Signed)
All labs reviewed. Abnormal studies sent to patients PCP when indicated.  Previous H&P reviewed, patient examined, there are NO CHANGES.  Phillip Matusik Porfilio8/24/20219:26 AM

## 2019-09-01 NOTE — Transfer of Care (Signed)
Immediate Anesthesia Transfer of Care Note  Patient: Phillip [name]  Procedure(s) Performed: CATARACT EXTRACTION PHACO AND INTRAOCULAR LENS PLACEMENT (IOC) LEFT 6.30  00:36.6 (Left Eye)  Patient Location: PACU  Anesthesia Type: MAC  Level of Consciousness: awake, alert  and patient cooperative  Airway and Oxygen Therapy: Patient Spontanous Breathing  Post-op Assessment: Post-op Vital signs reviewed, Patient's Cardiovascular Status Stable, Respiratory Function Stable, Patent Airway and No signs of Nausea or vomiting  Post-op Vital Signs: Reviewed and stable  Complications: No complications documented.

## 2019-09-02 ENCOUNTER — Encounter: Payer: Self-pay | Admitting: Ophthalmology

## 2019-09-03 ENCOUNTER — Other Ambulatory Visit: Payer: Self-pay | Admitting: Cardiovascular Disease

## 2019-09-03 MED ORDER — METFORMIN HCL 500 MG PO TABS
500.0000 mg | ORAL_TABLET | Freq: Two times a day (BID) | ORAL | 1 refills | Status: DC
Start: 2019-09-03 — End: 2020-02-29

## 2019-09-03 MED ORDER — CARVEDILOL 6.25 MG PO TABS
6.2500 mg | ORAL_TABLET | Freq: Two times a day (BID) | ORAL | 2 refills | Status: DC
Start: 2019-09-03 — End: 2019-11-02

## 2019-09-03 NOTE — Telephone Encounter (Signed)
*  STAT* If patient is at the pharmacy, call can be transferred to refill team.   1. Which medications need to be refilled? (please list name of each medication and dose if known)  metFORMIN (GLUCOPHAGE) 500 MG tablet carvedilol (COREG) 6.25 MG tablet  2. Which pharmacy/location (including street and city if local pharmacy) is medication to be sent to? [place], [place]  3. Do they need a 30 day or 90 day supply? 30 with refills   Pt has appt with his new PCP in October and will not have enough medication to last him until he sees his new PCP

## 2019-09-03 NOTE — Telephone Encounter (Signed)
Rx has been sent to the pharmacy electronically. ° °

## 2019-09-08 ENCOUNTER — Other Ambulatory Visit (HOSPITAL_COMMUNITY): Payer: Medicare Other

## 2019-09-22 ENCOUNTER — Encounter: Payer: Self-pay | Admitting: Ophthalmology

## 2019-09-22 ENCOUNTER — Other Ambulatory Visit: Payer: Self-pay

## 2019-09-25 ENCOUNTER — Other Ambulatory Visit
Admission: RE | Admit: 2019-09-25 | Discharge: 2019-09-25 | Disposition: A | Discharge status: Home / Self Care | Payer: Medicare Other | Source: Ambulatory Visit | Attending: Ophthalmology | Admitting: Ophthalmology

## 2019-09-25 ENCOUNTER — Other Ambulatory Visit: Payer: Self-pay

## 2019-09-25 DIAGNOSIS — Z01812 Encounter for preprocedural laboratory examination: Secondary | ICD-10-CM | POA: Insufficient documentation

## 2019-09-25 DIAGNOSIS — Z20822 Contact with and (suspected) exposure to covid-19: Secondary | ICD-10-CM | POA: Diagnosis not present

## 2019-09-26 LAB — SARS CORONAVIRUS 2 (TAT 6-24 HRS): SARS Coronavirus 2: NEGATIVE

## 2019-09-28 NOTE — Discharge Instructions (Signed)

## 2019-09-29 ENCOUNTER — Encounter
Admission: RE | Disposition: A | Discharge status: Home / Self Care | Payer: Self-pay | Source: Home / Self Care | Attending: Ophthalmology

## 2019-09-29 ENCOUNTER — Encounter: Payer: Self-pay | Admitting: Ophthalmology

## 2019-09-29 ENCOUNTER — Ambulatory Visit: Payer: Medicare Other | Admitting: Anesthesiology

## 2019-09-29 ENCOUNTER — Ambulatory Visit
Admission: RE | Admit: 2019-09-29 | Discharge: 2019-09-29 | Disposition: A | Discharge status: Home / Self Care | Payer: Medicare Other | Attending: Ophthalmology | Admitting: Ophthalmology

## 2019-09-29 ENCOUNTER — Other Ambulatory Visit: Payer: Self-pay

## 2019-09-29 DIAGNOSIS — H2511 Age-related nuclear cataract, right eye: Secondary | ICD-10-CM | POA: Insufficient documentation

## 2019-09-29 DIAGNOSIS — Z951 Presence of aortocoronary bypass graft: Secondary | ICD-10-CM | POA: Diagnosis not present

## 2019-09-29 DIAGNOSIS — E669 Obesity, unspecified: Secondary | ICD-10-CM | POA: Diagnosis not present

## 2019-09-29 DIAGNOSIS — I10 Essential (primary) hypertension: Secondary | ICD-10-CM | POA: Insufficient documentation

## 2019-09-29 DIAGNOSIS — Z85038 Personal history of other malignant neoplasm of large intestine: Secondary | ICD-10-CM | POA: Diagnosis not present

## 2019-09-29 DIAGNOSIS — Z87891 Personal history of nicotine dependence: Secondary | ICD-10-CM | POA: Diagnosis not present

## 2019-09-29 DIAGNOSIS — Z6836 Body mass index (BMI) 36.0-36.9, adult: Secondary | ICD-10-CM | POA: Insufficient documentation

## 2019-09-29 DIAGNOSIS — Z85828 Personal history of other malignant neoplasm of skin: Secondary | ICD-10-CM | POA: Diagnosis not present

## 2019-09-29 DIAGNOSIS — E1136 Type 2 diabetes mellitus with diabetic cataract: Secondary | ICD-10-CM | POA: Diagnosis not present

## 2019-09-29 DIAGNOSIS — I2510 Atherosclerotic heart disease of native coronary artery without angina pectoris: Secondary | ICD-10-CM | POA: Insufficient documentation

## 2019-09-29 DIAGNOSIS — Z7984 Long term (current) use of oral hypoglycemic drugs: Secondary | ICD-10-CM | POA: Diagnosis not present

## 2019-09-29 DIAGNOSIS — Z9842 Cataract extraction status, left eye: Secondary | ICD-10-CM | POA: Diagnosis not present

## 2019-09-29 DIAGNOSIS — Z86010 Personal history of colonic polyps: Secondary | ICD-10-CM | POA: Insufficient documentation

## 2019-09-29 HISTORY — PX: CATARACT EXTRACTION W/PHACO: SHX586

## 2019-09-29 LAB — GLUCOSE, CAPILLARY
Glucose-Capillary: 143 mg/dL — ABNORMAL HIGH (ref 70–99)
Glucose-Capillary: 175 mg/dL — ABNORMAL HIGH (ref 70–99)

## 2019-09-29 SURGERY — PHACOEMULSIFICATION, CATARACT, WITH IOL INSERTION
Anesthesia: Monitor Anesthesia Care | Site: Eye | Laterality: Right

## 2019-09-29 MED ORDER — EPINEPHRINE PF 1 MG/ML IJ SOLN
INTRAOCULAR | Status: DC | PRN
  Administered 2019-09-29: 43 mL via OPHTHALMIC

## 2019-09-29 MED ORDER — MIDAZOLAM HCL 2 MG/2ML IJ SOLN
INTRAMUSCULAR | Status: DC | PRN
  Administered 2019-09-29: 2 mg via INTRAVENOUS

## 2019-09-29 MED ORDER — TETRACAINE HCL 0.5 % OP SOLN
1.0000 [drp] | OPHTHALMIC | Status: DC | PRN
  Administered 2019-09-29 (×3): 1 [drp] via OPHTHALMIC

## 2019-09-29 MED ORDER — LIDOCAINE HCL (PF) 2 % IJ SOLN
INTRAOCULAR | Status: DC | PRN
  Administered 2019-09-29: 2 mL

## 2019-09-29 MED ORDER — LACTATED RINGERS IV SOLN: INTRAVENOUS | Status: DC

## 2019-09-29 MED ORDER — BRIMONIDINE TARTRATE-TIMOLOL 0.2-0.5 % OP SOLN
OPHTHALMIC | Status: DC | PRN
  Administered 2019-09-29: 1 [drp] via OPHTHALMIC

## 2019-09-29 MED ORDER — FENTANYL CITRATE (PF) 100 MCG/2ML IJ SOLN
INTRAMUSCULAR | Status: DC | PRN
Start: 2019-09-29 — End: 2019-09-29
  Administered 2019-09-29: 50 ug via INTRAVENOUS

## 2019-09-29 MED ORDER — NA CHONDROIT SULF-NA HYALURON 40-17 MG/ML IO SOLN
INTRAOCULAR | Status: DC | PRN
  Administered 2019-09-29: 1 mL via INTRAOCULAR

## 2019-09-29 MED ORDER — ARMC OPHTHALMIC DILATING DROPS
1.0000 "application " | OPHTHALMIC | Status: DC | PRN
  Administered 2019-09-29 (×3): 1 via OPHTHALMIC

## 2019-09-29 MED ORDER — MOXIFLOXACIN HCL 0.5 % OP SOLN
OPHTHALMIC | Status: DC | PRN
  Administered 2019-09-29: 0.2 mL via OPHTHALMIC

## 2019-09-29 SURGICAL SUPPLY — 19 items
CANNULA ANT/CHMB 27G (MISCELLANEOUS) ×2 IMPLANT
CANNULA ANT/CHMB 27GA (MISCELLANEOUS) ×6 IMPLANT
GLOVE SURG LX 8.0 MICRO (GLOVE) ×2
GLOVE SURG LX STRL 8.0 MICRO (GLOVE) ×1 IMPLANT
GLOVE SURG TRIUMPH 8.0 PF LTX (GLOVE) ×3 IMPLANT
GOWN STRL REUS W/ TWL LRG LVL3 (GOWN DISPOSABLE) ×2 IMPLANT
GOWN STRL REUS W/TWL LRG LVL3 (GOWN DISPOSABLE) ×6
LENS IOL TECNIS EYHANCE 16.5 ×2 IMPLANT
MARKER SKIN DUAL TIP RULER LAB (MISCELLANEOUS) ×3 IMPLANT
NDL FILTER BLUNT 18X1 1/2 (NEEDLE) ×1 IMPLANT
NEEDLE FILTER BLUNT 18X 1/2SAF (NEEDLE) ×2
NEEDLE FILTER BLUNT 18X1 1/2 (NEEDLE) ×1 IMPLANT
PACK EYE AFTER SURG (MISCELLANEOUS) ×3 IMPLANT
PACK OPTHALMIC (MISCELLANEOUS) ×3 IMPLANT
PACK PORFILIO (MISCELLANEOUS) ×3 IMPLANT
SYR 3ML LL SCALE MARK (SYRINGE) ×3 IMPLANT
SYR TB 1ML LUER SLIP (SYRINGE) ×3 IMPLANT
WATER STERILE IRR 250ML POUR (IV SOLUTION) ×3 IMPLANT
WIPE NON LINTING 3.25X3.25 (MISCELLANEOUS) ×3 IMPLANT

## 2019-09-29 NOTE — Transfer of Care (Signed)
Immediate Anesthesia Transfer of Care Note  Patient: Phillip [name]  Procedure(s) Performed: CATARACT EXTRACTION PHACO AND INTRAOCULAR LENS PLACEMENT (IOC) RIGHT DIABETIC 5.06 00:35.6 (Right Eye)  Patient Location: PACU  Anesthesia Type: MAC  Level of Consciousness: awake, alert  and patient cooperative  Airway and Oxygen Therapy: Patient Spontanous Breathing and Patient connected to supplemental oxygen  Post-op Assessment: Post-op Vital signs reviewed, Patient's Cardiovascular Status Stable, Respiratory Function Stable, Patent Airway and No signs of Nausea or vomiting  Post-op Vital Signs: Reviewed and stable  Complications: No complications documented.

## 2019-09-29 NOTE — Anesthesia Postprocedure Evaluation (Signed)
Anesthesia Post Note  Patient: Phillip [name]  Procedure(s) Performed: CATARACT EXTRACTION PHACO AND INTRAOCULAR LENS PLACEMENT (IOC) RIGHT DIABETIC 5.06 00:35.6 (Right Eye)     Patient location during evaluation: PACU Anesthesia Type: MAC Level of consciousness: awake and alert Pain management: pain level controlled Vital Signs Assessment: post-procedure vital signs reviewed and stable Respiratory status: spontaneous breathing Cardiovascular status: blood pressure returned to baseline Postop Assessment: no apparent nausea or vomiting, adequate PO intake and no headache Anesthetic complications: no   No complications documented.  Adele Barthel Alonni Heimsoth

## 2019-09-29 NOTE — Anesthesia Procedure Notes (Signed)
Procedure Name: MAC Date/Time: 09/29/2019 7:32 AM Performed by: Silvana Newness, CRNA Pre-anesthesia Checklist: Patient identified, Emergency Drugs available, Suction available, Patient being monitored and Timeout performed Patient Re-evaluated:Patient Re-evaluated prior to induction Oxygen Delivery Method: Nasal cannula Placement Confirmation: positive ETCO2

## 2019-09-29 NOTE — H&P (Signed)
All labs reviewed. Abnormal studies sent to patients PCP when indicated.  Previous H&P reviewed, patient examined, there are NO CHANGES.  Christen Wardrop Porfilio9/21/20217:22 AM

## 2019-09-29 NOTE — Anesthesia Preprocedure Evaluation (Signed)
Anesthesia Evaluation  Patient identified by MRN, date of birth, ID band Patient awake    History of Anesthesia Complications Negative for: history of anesthetic complications  Airway Mallampati: III  TM Distance: >3 FB Neck ROM: Full    Dental no notable dental hx.    Pulmonary neg pulmonary ROS, former smoker,    Pulmonary exam normal        Cardiovascular hypertension, Pt. on medications and Pt. on home beta blockers + CAD and + CABG (06/2019)  Normal cardiovascular exam+ Valvular Problems/Murmurs (mild AS)      Neuro/Psych negative neurological ROS     GI/Hepatic negative GI ROS, Neg liver ROS,   Endo/Other  diabetes (pre-DM)Obesity (BMI 37)  Renal/GU      Musculoskeletal   Abdominal   Peds  Hematology negative hematology ROS (+)   Anesthesia Other Findings   Reproductive/Obstetrics                             Anesthesia Physical Anesthesia Plan  ASA: III  Anesthesia Plan: MAC   Post-op Pain Management:    Induction: Intravenous  PONV Risk Score and Plan: 1 and Midazolam and TIVA  Airway Management Planned: Nasal Cannula and Natural Airway  Additional Equipment: None  Intra-op Plan:   Post-operative Plan:   Informed Consent: I have reviewed the patients History and Physical, chart, labs and discussed the procedure including the risks, benefits and alternatives for the proposed anesthesia with the patient or authorized representative who has indicated his/her understanding and acceptance.       Plan Discussed with: CRNA  Anesthesia Plan Comments:         Anesthesia Quick Evaluation

## 2019-09-29 NOTE — Op Note (Signed)
PREOPERATIVE DIAGNOSIS:  Nuclear sclerotic cataract of the right eye.   POSTOPERATIVE DIAGNOSIS:  H25.11 Cataract   OPERATIVE PROCEDURE:@   SURGEON:  Birder Robson, MD.   ANESTHESIA:  Anesthesiologist: Page, Adele Barthel, MD CRNA: Silvana Newness, CRNA  1.      Managed anesthesia care. 2.      0.51ml of Shugarcaine was instilled in the eye following the paracentesis.   COMPLICATIONS:  None.   TECHNIQUE:   Stop and chop   DESCRIPTION OF PROCEDURE:  The patient was examined and consented in the preoperative holding area where the aforementioned topical anesthesia was applied to the right eye and then brought back to the Operating Room where the right eye was prepped and draped in the usual sterile ophthalmic fashion and a lid speculum was placed. A paracentesis was created with the side port blade and the anterior chamber was filled with viscoelastic. A near clear corneal incision was performed with the steel keratome. A continuous curvilinear capsulorrhexis was performed with a cystotome followed by the capsulorrhexis forceps. Hydrodissection and hydrodelineation were carried out with BSS on a blunt cannula. The lens was removed in a stop and chop  technique and the remaining cortical material was removed with the irrigation-aspiration handpiece. The capsular bag was inflated with viscoelastic and the Technis [id number]  lens was placed in the capsular bag without complication. The remaining viscoelastic was removed from the eye with the irrigation-aspiration handpiece. The wounds were hydrated. The anterior chamber was flushed with BSS and the eye was inflated to physiologic pressure. 0.81ml of Vigamox was placed in the anterior chamber. The wounds were found to be water tight. The eye was dressed with Combigan. The patient was given protective glasses to wear throughout the day and a shield with which to sleep tonight. The patient was also given drops with which to begin a drop regimen today and will  follow-up with me in one day. Implant Name Type Inv. Item Serial No. Manufacturer Lot No. LRB No. Used Action  LENS EYHANCE 16.5 - [id number]  LENS EYHANCE 16.5 [id number] [employer]   Right 1 Implanted   Procedure(s) with comments: CATARACT EXTRACTION PHACO AND INTRAOCULAR LENS PLACEMENT (IOC) RIGHT DIABETIC 5.06 00:35.6 (Right) - Diabetic - oral meds  Electronically signed: Birder Robson 09/29/2019 7:45 AM

## 2019-09-30 ENCOUNTER — Encounter: Payer: Self-pay | Admitting: Ophthalmology

## 2019-10-07 ENCOUNTER — Encounter: Payer: Self-pay | Admitting: Cardiothoracic Surgery

## 2019-10-07 ENCOUNTER — Ambulatory Visit (INDEPENDENT_AMBULATORY_CARE_PROVIDER_SITE_OTHER): Payer: Medicare Other | Admitting: Cardiothoracic Surgery

## 2019-10-07 ENCOUNTER — Other Ambulatory Visit: Payer: Self-pay

## 2019-10-07 VITALS — BP 145/90 | HR 72 | Temp 97.6°F | Resp 20 | Ht 67.0 in | Wt 220.6 lb

## 2019-10-07 DIAGNOSIS — Z951 Presence of aortocoronary bypass graft: Secondary | ICD-10-CM | POA: Diagnosis not present

## 2019-10-07 DIAGNOSIS — Z09 Encounter for follow-up examination after completed treatment for conditions other than malignant neoplasm: Secondary | ICD-10-CM

## 2019-10-07 NOTE — Progress Notes (Signed)
PCP is Vertell Limber, [place] Chard, MD Referring Provider is Skeet Latch, MD  Chief Complaint  Patient presents with  . Routine Post Op    s/p CABGx4 06/30/19    HPI: The patient returns for final postop check 3 months after CABG x4 for angina and severe multivessel CAD.  He is doing very well without symptoms angina or CHF.  Surgical incisions are well-healed. He has had cataract surgery since his CABG and is recovered well from that as well.  His cardiologist is Dr. Oval Linsey.  Preoperative echo showed mild aortic stenosis with minimal transvalvular gradient that was not replaced at the time of CABG.  Past Medical History:  Diagnosis Date  . Aortic stenosis    mild to moderate AS 05/2019 echo   . CAD in native artery 06/09/2019  . Diabetes mellitus type 2 in obese ([place]) 06/09/2019  . Elevated hemoglobin A1c   . Essential hypertension 06/09/2019  . History of colonoscopy 01/08/2009  . History of high cholesterol   . Melanoma ([place])    back  . Pure hypercholesterolemia 06/09/2019    Past Surgical History:  Procedure Laterality Date  . CATARACT EXTRACTION W/PHACO Left 09/01/2019   Procedure: CATARACT EXTRACTION PHACO AND INTRAOCULAR LENS PLACEMENT (IOC) LEFT 6.30  00:36.6;  Surgeon: Birder Robson, MD;  Location: [place];  Service: Ophthalmology;  Laterality: Left;  . CATARACT EXTRACTION W/PHACO Right 09/29/2019   Procedure: CATARACT EXTRACTION PHACO AND INTRAOCULAR LENS PLACEMENT (IOC) RIGHT DIABETIC 5.06 00:35.6;  Surgeon: Birder Robson, MD;  Location: [place];  Service: Ophthalmology;  Laterality: Right;  Diabetic - oral meds  . COLONOSCOPY  2003  . COLONOSCOPY  01/08/2001  . CORONARY ARTERY BYPASS GRAFT N/A 06/30/2019   Procedure: CORONARY ARTERY BYPASS GRAFTING (CABG) x 4, LIMA TO DIAGONAL 1, SVG TO OML, SVG TO RAMUS, SVG TO PDA;  Surgeon: Prescott Gum, Collier Salina, MD;  Location: [place];  Service: Open Heart Surgery;  Laterality: N/A;  . ENDOVEIN HARVEST OF GREATER  SAPHENOUS VEIN Bilateral 06/30/2019   Procedure: ENDOVEIN HARVEST OF GREATER SAPHENOUS VEIN;  Surgeon: Ivin Poot, MD;  Location: [place];  Service: Open Heart Surgery;  Laterality: Bilateral;  . MELANOMA EXCISION  01/09/2004   back  . TEE WITHOUT CARDIOVERSION N/A 06/30/2019   Procedure: TRANSESOPHAGEAL ECHOCARDIOGRAM (TEE);  Surgeon: Prescott Gum, Collier Salina, MD;  Location: [place];  Service: Open Heart Surgery;  Laterality: N/A;  . THYROGLOSSAL DUCT CYST  01/09/1999    Family History  Problem Relation Age of Onset  . CAD Father   . Valvular heart disease Father   . Colon cancer Father   . Heart disease Maternal Uncle   . Heart disease Maternal Grandfather   . Kidney failure Mother   . Diabetes Mother   . Heart disease Mother     Social History Social History   Tobacco Use  . Smoking status: Former Smoker    Packs/day: 2.00    Years: 15.00    Pack years: 30.00    Types: Cigarettes    Quit date: 1979    Years since quitting: 42.7  . Smokeless tobacco: Never Used  Vaping Use  . Vaping Use: Never used  Substance Use Topics  . Alcohol use: Yes    Alcohol/week: 6.0 - 8.0 standard drinks    Types: 6 - 8 Glasses of wine per week    Comment: wine   . Drug use: Not on file    Current Outpatient Medications  Medication Sig Dispense Refill  .  aspirin EC 81 MG tablet Take 81 mg by mouth daily. Swallow whole.    . metFORMIN (GLUCOPHAGE) 500 MG tablet Take 1 tablet (500 mg total) by mouth 2 (two) times daily with a meal. 180 tablet 1  . Multiple Vitamins-Minerals (CENTRUM SILVER PO) Take by mouth daily.    . carvedilol (COREG) 6.25 MG tablet Take 1 tablet (6.25 mg total) by mouth 2 (two) times daily with a meal. (Patient not taking: Reported on 10/07/2019) 180 tablet 2  . rosuvastatin (CRESTOR) 40 MG tablet Take 1 tablet (40 mg total) by mouth at bedtime. 90 tablet 1   No current facility-administered medications for this visit.    No Known Allergies  Review of Systems  No new  complaints  BP (!) 145/90   Pulse 72   Temp 97.6 F (36.4 C)   Resp 20   Ht 5\' 7"  (1.702 m)   Wt 220 lb 9.6 oz (100.1 kg)   SpO2 97%   BMI 34.55 kg/m  Physical Exam      Exam    General- alert and comfortable.  Surgical incisions well-healed.    Neck- no JVD, no cervical adenopathy palpable, no carotid bruit   Lungs- clear without rales, wheezes   Cor- regular rate and rhythm, no murmur , gallop   Abdomen- soft, non-tender   Extremities - warm, non-tender, minimal edema   Neuro- oriented, appropriate, no focal weakness   Diagnostic Tests: Last postop chest x-ray reviewed and is clear.  Impression: Excellent recovery 3 months after CABG x4.  Plan: Importance of heart healthy lifestyle including heart healthy diet and 30 to 40 minutes of aerobic activity 5 days out of 7 was emphasized to the patient as well as maintaining his aspirin and Crestor and beta-blocker and good diabetic control. He will return as needed.  Len Childs, MD [facility] [phone]

## 2019-11-02 ENCOUNTER — Encounter: Payer: Self-pay | Admitting: Internal Medicine

## 2019-11-02 ENCOUNTER — Ambulatory Visit (INDEPENDENT_AMBULATORY_CARE_PROVIDER_SITE_OTHER): Payer: Medicare Other | Admitting: Internal Medicine

## 2019-11-02 ENCOUNTER — Other Ambulatory Visit: Payer: Self-pay

## 2019-11-02 VITALS — BP 122/82 | HR 61 | Temp 97.5°F | Ht 67.0 in | Wt 221.1 lb

## 2019-11-02 DIAGNOSIS — K649 Unspecified hemorrhoids: Secondary | ICD-10-CM

## 2019-11-02 DIAGNOSIS — Z85820 Personal history of malignant melanoma of skin: Secondary | ICD-10-CM

## 2019-11-02 DIAGNOSIS — Z860101 Personal history of adenomatous and serrated colon polyps: Secondary | ICD-10-CM

## 2019-11-02 DIAGNOSIS — Z23 Encounter for immunization: Secondary | ICD-10-CM

## 2019-11-02 DIAGNOSIS — Z1159 Encounter for screening for other viral diseases: Secondary | ICD-10-CM

## 2019-11-02 DIAGNOSIS — Z800 Family history of malignant neoplasm of digestive organs: Secondary | ICD-10-CM | POA: Diagnosis not present

## 2019-11-02 DIAGNOSIS — Z951 Presence of aortocoronary bypass graft: Secondary | ICD-10-CM

## 2019-11-02 DIAGNOSIS — I2510 Atherosclerotic heart disease of native coronary artery without angina pectoris: Secondary | ICD-10-CM

## 2019-11-02 DIAGNOSIS — I10 Essential (primary) hypertension: Secondary | ICD-10-CM | POA: Diagnosis not present

## 2019-11-02 DIAGNOSIS — E1165 Type 2 diabetes mellitus with hyperglycemia: Secondary | ICD-10-CM

## 2019-11-02 DIAGNOSIS — Z86010 Personal history of colonic polyps: Secondary | ICD-10-CM

## 2019-11-02 DIAGNOSIS — Z114 Encounter for screening for human immunodeficiency virus [HIV]: Secondary | ICD-10-CM

## 2019-11-02 DIAGNOSIS — E7800 Pure hypercholesterolemia, unspecified: Secondary | ICD-10-CM

## 2019-11-02 DIAGNOSIS — I209 Angina pectoris, unspecified: Secondary | ICD-10-CM

## 2019-11-02 MED ORDER — ROSUVASTATIN CALCIUM 40 MG PO TABS: 40.0000 mg | ORAL_TABLET | Freq: Every day | ORAL | 1 refills | Status: DC

## 2019-11-02 MED ORDER — CARVEDILOL 6.25 MG PO TABS
6.2500 mg | ORAL_TABLET | Freq: Two times a day (BID) | ORAL | 2 refills | Status: DC
Start: 2019-11-02 — End: 2020-06-02

## 2019-11-02 NOTE — Progress Notes (Signed)
Provider:  Rexene Edison. Mariea Clonts, D.O., C.M.D. Location:   [place]  Place of Service:   clinic  Previous PCP:  Dionne Volanda Napoleon, MD-at [facility] :Patient Care Team: Gayland Curry, DO as PCP - General (Geriatric Medicine) Skeet Latch, MD as PCP - Cardiology (Cardiology) Skeet Latch, MD as Attending Physician (Cardiology) Dingeldein, Remo Lipps, MD (Ophthalmology) Bloomfeld, Sharen Hint, MD as Referring Physician (Gastroenterology)  Extended Emergency Contact Information Primary Emergency Contact: [facility] Phone: [phone] Relation: Significant other  Goals of Care: Advanced Directive information Advanced Directives 11/02/2019  Does Patient Have a Medical Advance Directive? Yes  Type of Paramedic of [place];Living will  Does patient want to make changes to medical advance directive? No - Patient declined  Copy of [place] in Chart? No - copy requested  has above and thought they were scanned when he had his CABG  Chief Complaint  Patient presents with  . Health Maintenance    influenza, foot exam, hep C, [place], PNA   . Acute Visit    Talk about diabetes no ever told him he had and a referral for colonoscopy .  Marland Kitchen Establish Care    new patient    HPI: Patient is a 69 y.o. male seen today to establish with [facility].  Records have been requested from his prior PCP reportedly what packet received.  He has a h/o htn, hyperlipidemia, elevated hba1c, thyroglossal duct cyst, cancerous colon polyp in 2003, melanoma in 2006, CABGx4 vessels 2021 with Dr. Nils Pyle.    His hba1c was 8 4 months ago (06/26/19)  He is in a custom swimming pool business.  He is from [place], then went to [place], did in [place] recreation--cooking and exercise, and then [place] here.  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  3 kids.  First wife passed away young.  His ex-wife is still alive.    He'd been a healthy person.   Was not on cholesterol meds or diabetic meds until his heart surgery.  Wants to be off of those.  He had no symptoms before his heart surgery.  He had played tennis.  He gained weight after his CABG.  Wants to knock off 20 lbs and come off the medications.  He has a lot of familial cardiac issues.  Had been found with stress test.   Does not smoke.  Does drink wine--1-2 red.  Will have an occasional cocktail like 1-2/month.  Does not eat fried foods or steak/red meat, may have occasional hamburger.  Cooks nightly--chicken, tuna, pork tenderloin, salads.  He has done a lot of traveling and gained weight then for work--over sept/oct.  He does typically bring his food.  Plays tennis when he can--4x last week.  He and his girlfriend hit the ball around not truly playing.  He has a treadmill and recumbent bike that he has but should use more.  He does like to walk.    He two cataracts removed--[facility]--Dr. Porfillio--no diabetic retinopathy.  No neuropathy.      Goal is to get to 190 lbs.    He's hd tinnitus since the '70s.  Has it right now, but hasn't noticed it in a while--less than it once was.    Hemorrhoids--says asteroids--has had occasional need for creams.  Has had for years.   Past Medical History:  Diagnosis Date  . Aortic stenosis    mild to moderate AS 05/2019 echo   . CAD in native artery 06/09/2019  .  Diabetes mellitus type 2 in obese ([place]) 06/09/2019  . Elevated hemoglobin A1c   . Essential hypertension 06/09/2019  . History of colonoscopy 01/08/2009  . History of high cholesterol   . Melanoma ([place])    back  . Pure hypercholesterolemia 06/09/2019   Past Surgical History:  Procedure Laterality Date  . CARDIAC SURGERY  06/30/2019  . CATARACT EXTRACTION W/PHACO Left 09/01/2019   Procedure: CATARACT EXTRACTION PHACO AND INTRAOCULAR LENS PLACEMENT (IOC) LEFT 6.30  00:36.6;  Surgeon: Birder Robson, MD;  Location: [place];  Service: Ophthalmology;  Laterality:  Left;  . CATARACT EXTRACTION W/PHACO Right 09/29/2019   Procedure: CATARACT EXTRACTION PHACO AND INTRAOCULAR LENS PLACEMENT (IOC) RIGHT DIABETIC 5.06 00:35.6;  Surgeon: Birder Robson, MD;  Location: [place];  Service: Ophthalmology;  Laterality: Right;  Diabetic - oral meds  . COLONOSCOPY  2003  . COLONOSCOPY  01/08/2001  . CORONARY ARTERY BYPASS GRAFT N/A 06/30/2019   Procedure: CORONARY ARTERY BYPASS GRAFTING (CABG) x 4, LIMA TO DIAGONAL 1, SVG TO OML, SVG TO RAMUS, SVG TO PDA;  Surgeon: Prescott Gum, Collier Salina, MD;  Location: [place];  Service: Open Heart Surgery;  Laterality: N/A;  . ENDOVEIN HARVEST OF GREATER SAPHENOUS VEIN Bilateral 06/30/2019   Procedure: ENDOVEIN HARVEST OF GREATER SAPHENOUS VEIN;  Surgeon: Ivin Poot, MD;  Location: [place];  Service: Open Heart Surgery;  Laterality: Bilateral;  . MELANOMA EXCISION  01/09/2004   back  . TEE WITHOUT CARDIOVERSION N/A 06/30/2019   Procedure: TRANSESOPHAGEAL ECHOCARDIOGRAM (TEE);  Surgeon: Prescott Gum, Collier Salina, MD;  Location: [place];  Service: Open Heart Surgery;  Laterality: N/A;  . THYROGLOSSAL DUCT CYST  01/09/1999    Social History   Socioeconomic History  . Marital status: Single    Spouse name: Not on file  . Number of children: Not on file  . Years of education: Not on file  . Highest education level: Not on file  Occupational History  . Not on file  Tobacco Use  . Smoking status: Former Smoker    Packs/day: 2.00    Years: 15.00    Pack years: 30.00    Types: Cigarettes    Quit date: 1979    Years since quitting: 42.8  . Smokeless tobacco: Never Used  Vaping Use  . Vaping Use: Never used  Substance and Sexual Activity  . Alcohol use: Yes    Alcohol/week: 6.0 - 8.0 standard drinks    Types: 6 - 8 Glasses of wine per week    Comment: wine   . Drug use: Not on file  . Sexual activity: Not on file  Other Topics Concern  . Not on file  Social History Narrative   Tobacco use, amount per day now:   Past  tobacco use, amount per day: Quit in 1979   How many years did you use tobacco: 10   Alcohol use (drinks per week): 2 glasses of red wine.   Diet:   Do you drink/eat things with caffeine: 1/2 cup coffee per day.   Marital status: Widowed                                 What year were you married?   Do you live in a house, apartment, assisted living, condo, trailer, etc.? Townhouse   Is it one or more stories? One   How many persons live in your home? One   Do you have  pets in your home?( please list) No   Highest Level of education completed? Masters Degree.   Current or past profession: Sales   Do you exercise? Yes                                Type and how often? Walk Daily, Tennis.   Do you have a living will? Yes   Do you have a DNR form?  Yes                                 If not, do you want to discuss one?   Do you have signed POA/HPOA forms? Yes                       If so, please bring to you appointment       Do you have difficulty bathing or dressing yourself? No   Do you have difficulty preparing food or eating? No   Do you have difficulty managing your medications? No   Do you have difficulty managing your finances? No   Do you have difficulty affording your medications? No   Social Determinants of Health   Financial Resource Strain:   . Difficulty of Paying Living Expenses: Not on file  Food Insecurity:   . Worried About Charity fundraiser in the Last Year: Not on file  . Ran Out of Food in the Last Year: Not on file  Transportation Needs:   . Lack of Transportation (Medical): Not on file  . Lack of Transportation (Non-Medical): Not on file  Physical Activity:   . Days of Exercise per Week: Not on file  . Minutes of Exercise per Session: Not on file  Stress:   . Feeling of Stress : Not on file  Social Connections:   . Frequency of Communication with Friends and Family: Not on file  . Frequency of Social Gatherings with Friends and Family: Not on file  .  Attends Religious Services: Not on file  . Active Member of Clubs or Organizations: Not on file  . Attends Archivist Meetings: Not on file  . Marital Status: Not on file    reports that he quit smoking about 42 years ago. His smoking use included cigarettes. He has a 30.00 pack-year smoking history. He has never used smokeless tobacco. He reports current alcohol use of about 6.0 - 8.0 standard drinks of alcohol per week. No history on file for drug use.  Functional Status Survey:    Family History  Problem Relation Age of Onset  . CAD Father   . Valvular heart disease Father   . Colon cancer Father   . Heart disease Maternal Uncle   . Heart disease Maternal Grandfather   . Kidney failure Mother   . Diabetes Mother   . Heart disease Mother     Health Maintenance  Topic Date Due  . Hepatitis C Screening  Never done  . URINE MICROALBUMIN  Never done  . COLONOSCOPY  Never done  . PNA vac Low Risk Adult (1 of 2 - PCV13) Never done  . HEMOGLOBIN A1C  12/26/2019  . OPHTHALMOLOGY EXAM  10/25/2020  . FOOT EXAM  11/01/2020  . TETANUS/TDAP  01/25/2028  . INFLUENZA VACCINE  Completed  . COVID-19 Vaccine  Completed    No Known Allergies  Outpatient Encounter Medications as of 11/02/2019  Medication Sig  . aspirin EC 81 MG tablet Take 81 mg by mouth daily. Swallow whole.  . carvedilol (COREG) 6.25 MG tablet Take 1 tablet (6.25 mg total) by mouth 2 (two) times daily with a meal.  . metFORMIN (GLUCOPHAGE) 500 MG tablet Take 1 tablet (500 mg total) by mouth 2 (two) times daily with a meal.  . Multiple Vitamins-Minerals (CENTRUM SILVER PO) Take by mouth daily.  . rosuvastatin (CRESTOR) 40 MG tablet Take 1 tablet (40 mg total) by mouth at bedtime.  . [DISCONTINUED] carvedilol (COREG) 6.25 MG tablet Take 1 tablet (6.25 mg total) by mouth 2 (two) times daily with a meal.  . [DISCONTINUED] rosuvastatin (CRESTOR) 40 MG tablet Take 1 tablet (40 mg total) by mouth at bedtime.   No  facility-administered encounter medications on file as of 11/02/2019.    Review of Systems  Constitutional: Negative for chills, fever and malaise/fatigue.  HENT: Positive for tinnitus. Negative for congestion, hearing loss and sore throat.   Eyes: Negative for blurred vision.       Bilateral cataract surgery done in august and september  Respiratory: Negative for cough and shortness of breath.   Cardiovascular: Negative for chest pain, palpitations and leg swelling.  Gastrointestinal: Negative for abdominal pain, blood in stool, constipation and melena.       Hemorrhoids; h/o cancerous polyp  Genitourinary: Negative for dysuria.  Musculoskeletal: Negative for back pain, falls, joint pain and neck pain.  Skin: Negative for itching and rash.       H/o melanoma  Neurological: Negative for dizziness, loss of consciousness and weakness.  Endo/Heme/Allergies: Does not bruise/bleed easily.  Psychiatric/Behavioral: Negative for depression and memory loss. The patient is not nervous/anxious and does not have insomnia.     Vitals:   11/02/19 1314  BP: 122/82  Pulse: 61  Temp: (!) 97.5 F (36.4 C)  TempSrc: Temporal  SpO2: 98%  Weight: 221 lb 1.6 oz (100.3 kg)  Height: 5\' 7"  (1.702 m)   Body mass index is 34.63 kg/m. Physical Exam Vitals reviewed.  Constitutional:      General: He is not in acute distress.    Appearance: Normal appearance. He is not ill-appearing or toxic-appearing.  HENT:     Head: Normocephalic and atraumatic.     Right Ear: Tympanic membrane, ear canal and external ear normal.     Left Ear: Tympanic membrane, ear canal and external ear normal.     Ears:     Comments: Some cerumen in left ear    Nose: Nose normal. No congestion.     Mouth/Throat:     Pharynx: Oropharynx is clear. No oropharyngeal exudate.  Eyes:     Extraocular Movements: Extraocular movements intact.     Conjunctiva/sclera: Conjunctivae normal.     Pupils: Pupils are equal, round, and  reactive to light.  Cardiovascular:     Rate and Rhythm: Normal rate and regular rhythm.     Pulses: Normal pulses.     Heart sounds: Murmur heard.      Comments: 2/6 systolic murmur Pulmonary:     Effort: Pulmonary effort is normal.     Breath sounds: Normal breath sounds. No wheezing, rhonchi or rales.  Abdominal:     General: Bowel sounds are normal. There is no distension.     Palpations: Abdomen is soft.     Tenderness: There is no abdominal tenderness. There is no guarding or rebound.     Comments:  diasthesis recti  Musculoskeletal:        General: Normal range of motion.     Cervical back: Neck supple. No rigidity or tenderness.     Right lower leg: No edema.     Left lower leg: No edema.  Lymphadenopathy:     Cervical: No cervical adenopathy.  Skin:    General: Skin is warm and dry.     Capillary Refill: Capillary refill takes less than 2 seconds.  Neurological:     General: No focal deficit present.     Mental Status: He is alert and oriented to person, place, and time.     Cranial Nerves: No cranial nerve deficit.     Sensory: No sensory deficit.     Motor: No weakness.     Coordination: Coordination normal.     Gait: Gait normal.     Deep Tendon Reflexes: Reflexes normal.  Psychiatric:        Mood and Affect: Mood normal.        Behavior: Behavior normal.        Thought Content: Thought content normal.        Judgment: Judgment normal.     Labs reviewed: Basic Metabolic Panel: Recent Labs    07/01/19 0400 07/01/19 0409 07/01/19 1535 07/02/19 0520 07/03/19 0433 07/04/19 0338 07/05/19 0254 07/06/19 0419 08/18/19 0940  NA 139   < > 137   < > 135   < > 136 135 139  K 4.0   < > 4.1   < > 3.4*   < > 3.0* 3.2* 5.0  CL 108   < > 104   < > 93*   < > 92* 93* 101  CO2 22   < > 25   < > 29   < > 34* 32 23  GLUCOSE 138*   < > 209*   < > 154*   < > 127* 153* 141*  BUN 6*   < > 7*   < > 10   < > 21 21 15   CREATININE 0.61   < > 0.71   < > 0.87   < > 1.20 1.14  0.97  CALCIUM 8.2*   < > 8.5*   < > 9.6   < > 9.3 9.6 10.0  MG 2.1  --  2.1  --  1.8  --   --   --   --    < > = values in this interval not displayed.   Liver Function Tests: Recent Labs    06/26/19 1200 08/18/19 0940  AST 32 41*  ALT 56* 35  ALKPHOS 66 79  BILITOT 1.2 0.5  PROT 7.0 7.1  ALBUMIN 4.3 4.7   No results for input(s): LIPASE, AMYLASE in the last 8760 hours. No results for input(s): AMMONIA in the last 8760 hours. CBC: Recent Labs    07/01/19 1535 07/02/19 0520 07/04/19 0338 07/05/19 0254 07/06/19 0419  WBC 9.0   < > 13.7* 11.4* 12.3*  NEUTROABS 7.0  --   --   --   --   HGB 8.5*   < > 10.3* 9.1* 9.6*  HCT 24.8*   < > 28.7* 25.8* 27.3*  MCV 94.7   < > 91.7 91.2 91.6  PLT 115*   < > 299 271 294   < > = values in this interval not displayed.   Cardiac Enzymes: No results for input(s): CKTOTAL, CKMB, CKMBINDEX, TROPONINI in the last 8760 hours. BNP: Invalid  input(s): POCBNP Lab Results  Component Value Date   HGBA1C 8.0 (H) 06/26/2019    Assessment/Plan 1. CAD in native artery - asymptomatic--had positive stress test - CBC with Differential/Platelet; Future - COMPLETE METABOLIC PANEL WITH GFR; Future - Lipid panel; Future -f/u with Dr. Oval Linsey  2. S/P CABG x 4 -continue current tx  3. Family history of colon cancer - and personal h/o malignant polyp - Ambulatory referral to Gastroenterology for colonoscopy  4. Essential hypertension - bp at goal, cont same regimen - CBC with Differential/Platelet; Future - COMPLETE METABOLIC PANEL WITH GFR; Future  5. Pure hypercholesterolemia -cont crestor - COMPLETE METABOLIC PANEL WITH GFR; Future - Lipid panel; Future - Lipid panel; Future  6. History of melanoma -cont annual skin exams with derm  7. Hemorrhoids, unspecified hemorrhoid type - not currently problematic, has spurts of flare=ups - CBC with Differential/Platelet; Future  8. History of adenomatous polyp of colon -referred for  colonoscopy--does not want to go to [facility] anymore   9. Uncontrolled type 2 diabetes mellitus with hyperglycemia (HCC) - is working on diet and exercise and hopes to come off the metformin -recommended 30 mins of walking daily plus his tennis; diet sounds excellent  - Hemoglobin A1c; Future - Hemoglobin A1c; Future - Microalbumin / creatinine urine ratio; Future  10. Screening for HIV (human immunodeficiency virus) -did not allow me to order screen due to his age  48. Encounter for hepatitis C screening test for low risk patient - Hepatitis C antibody; Future  12. Need for influenza vaccination - Flu Vaccine QUAD High Dose(Fluad) given  Labs/tests ordered:   Lab Orders     CBC with Differential/Platelet     COMPLETE METABOLIC PANEL WITH GFR     Hemoglobin A1c     Lipid panel     Hemoglobin A1c     Lipid panel     Hepatitis C antibody     Microalbumin / creatinine urine ratio Fasting labs at his convenience Then f/u 4 mos with fasting labs before  Tanina Barb L. Jamilex Bohnsack, D.O. [facility] [address] [place], [place] [address] Cell Phone (Mon-Fri 8am-5pm):  [phone] On Call:  [phone] & follow prompts after 5pm & weekends Office Phone:  [phone] Office Fax:  [phone]

## 2019-11-03 ENCOUNTER — Telehealth: Payer: Self-pay

## 2019-11-03 NOTE — Telephone Encounter (Signed)
[facility] [phone] Ext [phone]) called about a referral they received yesterday for a colonoscopy. He is scheduled at [facility] for a colonoscopy tomorrow.  She just wanted to verify this before calling him to schedule an appt.

## 2019-11-10 ENCOUNTER — Other Ambulatory Visit: Payer: Medicare Other

## 2019-11-17 ENCOUNTER — Other Ambulatory Visit: Payer: Medicare Other

## 2019-11-17 ENCOUNTER — Other Ambulatory Visit: Payer: Self-pay

## 2019-11-17 DIAGNOSIS — E1165 Type 2 diabetes mellitus with hyperglycemia: Secondary | ICD-10-CM

## 2019-11-17 DIAGNOSIS — I2510 Atherosclerotic heart disease of native coronary artery without angina pectoris: Secondary | ICD-10-CM

## 2019-11-17 DIAGNOSIS — E7800 Pure hypercholesterolemia, unspecified: Secondary | ICD-10-CM

## 2019-11-17 DIAGNOSIS — I10 Essential (primary) hypertension: Secondary | ICD-10-CM

## 2019-11-17 DIAGNOSIS — K649 Unspecified hemorrhoids: Secondary | ICD-10-CM

## 2019-11-18 LAB — CBC WITH DIFFERENTIAL/PLATELET
Absolute Monocytes: 506 cells/uL (ref 200–950)
Basophils Absolute: 39 cells/uL (ref 0–200)
Basophils Relative: 0.7 %
Eosinophils Absolute: 77 cells/uL (ref 15–500)
Eosinophils Relative: 1.4 %
HCT: 44.9 % (ref 38.5–50.0)
Hemoglobin: 15 g/dL (ref 13.2–17.1)
Lymphs Abs: 1007 cells/uL (ref 850–3900)
MCH: 29.8 pg (ref 27.0–33.0)
MCHC: 33.4 g/dL (ref 32.0–36.0)
MCV: 89.1 fL (ref 80.0–100.0)
MPV: 11 fL (ref 7.5–12.5)
Monocytes Relative: 9.2 %
Neutro Abs: 3872 cells/uL (ref 1500–7800)
Neutrophils Relative %: 70.4 %
Platelets: 182 10*3/uL (ref 140–400)
RBC: 5.04 10*6/uL (ref 4.20–5.80)
RDW: 14.4 % (ref 11.0–15.0)
Total Lymphocyte: 18.3 %
WBC: 5.5 10*3/uL (ref 3.8–10.8)

## 2019-11-18 LAB — COMPLETE METABOLIC PANEL WITH GFR
AG Ratio: 1.8 (calc) (ref 1.0–2.5)
ALT: 31 U/L (ref 9–46)
AST: 28 U/L (ref 10–35)
Albumin: 4.4 g/dL (ref 3.6–5.1)
Alkaline phosphatase (APISO): 59 U/L (ref 35–144)
BUN: 20 mg/dL (ref 7–25)
CO2: 30 mmol/L (ref 20–32)
Calcium: 10.2 mg/dL (ref 8.6–10.3)
Chloride: 103 mmol/L (ref 98–110)
Creat: 0.91 mg/dL (ref 0.70–1.25)
GFR, Est African American: 100 mL/min/{1.73_m2} (ref >=60)
GFR, Est Non African American: 86 mL/min/{1.73_m2} (ref >=60)
Globulin: 2.4 g/dL (calc) (ref 1.9–3.7)
Glucose, Bld: 177 mg/dL — ABNORMAL HIGH (ref 65–99)
Potassium: 4.8 mmol/L (ref 3.5–5.3)
Sodium: 138 mmol/L (ref 135–146)
Total Bilirubin: 0.8 mg/dL (ref 0.2–1.2)
Total Protein: 6.8 g/dL (ref 6.1–8.1)

## 2019-11-18 LAB — HEMOGLOBIN A1C
Hgb A1c MFr Bld: 7 % of total Hgb — ABNORMAL HIGH (ref <5.7)
Mean Plasma Glucose: 154 (calc)
eAG (mmol/L): 8.5 (calc)

## 2019-11-18 LAB — LIPID PANEL
Cholesterol: 117 mg/dL (ref <200)
HDL: 51 mg/dL (ref >=40)
LDL Cholesterol (Calc): 47 mg/dL (calc)
Non-HDL Cholesterol (Calc): 66 mg/dL (calc) (ref <130)
Total CHOL/HDL Ratio: 2.3 (calc) (ref <5.0)
Triglycerides: 104 mg/dL (ref <150)

## 2019-11-18 NOTE — Progress Notes (Signed)
Electrolytes, liver and kidneys were all normal.  Glucose/sugar was elevated at 177.  Hba1c (sugar average) has improved from 8 four months ago to 7 now which is good progress.  If he can continue to work on his diet and "move more" over the next few months until we recheck in February, he may be able to get to less than 6.5 which would be great.  If he does not think he can make more changes with diet and exercise, I'd suggest he take metformin 1000mg  with his largest meal and 500mg  with the other meal.   Bad cholesterol is at goal of less than 70.  Good cholesterol is also pretty decent

## 2019-12-15 ENCOUNTER — Encounter: Payer: Self-pay | Admitting: Internal Medicine

## 2020-02-29 ENCOUNTER — Other Ambulatory Visit: Payer: Self-pay | Admitting: Cardiovascular Disease

## 2020-02-29 ENCOUNTER — Encounter: Payer: Self-pay | Admitting: Internal Medicine

## 2020-03-07 ENCOUNTER — Other Ambulatory Visit: Payer: Medicare Other

## 2020-03-07 DIAGNOSIS — E7800 Pure hypercholesterolemia, unspecified: Secondary | ICD-10-CM

## 2020-03-07 DIAGNOSIS — Z1159 Encounter for screening for other viral diseases: Secondary | ICD-10-CM

## 2020-03-07 DIAGNOSIS — E1165 Type 2 diabetes mellitus with hyperglycemia: Secondary | ICD-10-CM

## 2020-03-10 ENCOUNTER — Ambulatory Visit: Payer: Medicare Other | Admitting: Internal Medicine

## 2020-03-30 ENCOUNTER — Other Ambulatory Visit: Payer: Self-pay | Admitting: Orthopedic Surgery

## 2020-03-30 ENCOUNTER — Telehealth: Payer: Self-pay

## 2020-03-30 DIAGNOSIS — I10 Essential (primary) hypertension: Secondary | ICD-10-CM

## 2020-03-30 DIAGNOSIS — E7800 Pure hypercholesterolemia, unspecified: Secondary | ICD-10-CM

## 2020-03-30 NOTE — Telephone Encounter (Signed)
Noted  

## 2020-03-30 NOTE — Telephone Encounter (Signed)
Patient scheduled for fasting labs 04/12/2020 and 4 month follow up with Windell Moulding, NP 04/14/2020. Please check to see if future labs are placed.

## 2020-03-30 NOTE — Telephone Encounter (Signed)
Orders for future labs placed.

## 2020-03-30 NOTE — Telephone Encounter (Signed)
Patient called and states that he just had appointment with Dr.Reed 03/10/2020. Patient states that he needs referral to get Colonoscopy because previous referral expired. Patient made aware that Dr.Reed is no long with this practice. Patient states that Phillip Moulding, NP is now his PCP However patient has no upcoming appointments. Message routed to Phillip Moulding, NP. Please Advise.

## 2020-03-30 NOTE — Telephone Encounter (Signed)
Appears colonoscopy was scheduled with [facility] for 11/04/2019. I will reorder referral if procedure was not done. Dr. Mariea Clonts advised 4 months follow up with fasting labs- that would have been February 2022. Please schedule f/u appointment. Thanks!

## 2020-04-08 ENCOUNTER — Encounter: Payer: Self-pay | Admitting: Orthopedic Surgery

## 2020-04-08 DIAGNOSIS — Z800 Family history of malignant neoplasm of digestive organs: Secondary | ICD-10-CM

## 2020-04-08 NOTE — Telephone Encounter (Signed)
Referral pended and routed to Yvonna Alanis, NP to review and sign

## 2020-04-12 ENCOUNTER — Other Ambulatory Visit: Payer: Medicare Other

## 2020-04-12 DIAGNOSIS — E7800 Pure hypercholesterolemia, unspecified: Secondary | ICD-10-CM

## 2020-04-12 DIAGNOSIS — I10 Essential (primary) hypertension: Secondary | ICD-10-CM

## 2020-04-14 ENCOUNTER — Ambulatory Visit: Payer: PRIVATE HEALTH INSURANCE | Admitting: Orthopedic Surgery

## 2020-04-28 ENCOUNTER — Telehealth: Payer: Self-pay | Admitting: Orthopedic Surgery

## 2020-04-28 NOTE — Telephone Encounter (Signed)
Referral for colonoscopy made 04/01. Received notification from [facility] they were unable to reach patient. Referral coordinator notified.

## 2020-06-02 ENCOUNTER — Other Ambulatory Visit: Payer: Self-pay | Admitting: Cardiovascular Disease

## 2020-06-07 ENCOUNTER — Telehealth: Payer: Self-pay | Admitting: *Deleted

## 2020-06-07 MED ORDER — ROSUVASTATIN CALCIUM 40 MG PO TABS: 40.0000 mg | ORAL_TABLET | Freq: Every day | ORAL | 0 refills | Status: DC

## 2020-06-07 NOTE — Telephone Encounter (Signed)
Spoke with patient and he stated that he is staying with our practice and he will call once he comes back from vacation and schedule an appointment.

## 2020-06-07 NOTE — Telephone Encounter (Signed)
[facility] called requesting refill on patient's Crestor.   Patient has canceled upcoming appointment and lab with office. Just calling patient to reschedule appointment and confirm PCP.   Last seen by Dr. Mariea Clonts 11/02/2019 and was to follow up in 4 months with fasting labs.   LMOM to return call.

## 2020-08-31 ENCOUNTER — Other Ambulatory Visit (HOSPITAL_COMMUNITY): Payer: Medicare Other

## 2020-09-08 ENCOUNTER — Ambulatory Visit (HOSPITAL_BASED_OUTPATIENT_CLINIC_OR_DEPARTMENT_OTHER): Payer: Medicare Other | Admitting: Cardiovascular Disease

## 2020-09-15 ENCOUNTER — Other Ambulatory Visit: Payer: Self-pay | Admitting: *Deleted

## 2020-09-15 ENCOUNTER — Encounter (HOSPITAL_BASED_OUTPATIENT_CLINIC_OR_DEPARTMENT_OTHER): Payer: Self-pay

## 2020-09-15 ENCOUNTER — Encounter: Payer: Self-pay | Admitting: Orthopedic Surgery

## 2020-09-15 MED ORDER — ROSUVASTATIN CALCIUM 40 MG PO TABS: 40.0000 mg | ORAL_TABLET | Freq: Every day | ORAL | 0 refills | Status: DC

## 2020-09-15 MED ORDER — CARVEDILOL 6.25 MG PO TABS: 6.2500 mg | ORAL_TABLET | Freq: Two times a day (BID) | ORAL | 0 refills | Status: DC

## 2020-09-15 NOTE — Telephone Encounter (Signed)
Called and let patient know that refills for his Crestor and Coreg has been sent to [facility].

## 2020-09-19 ENCOUNTER — Other Ambulatory Visit: Payer: Self-pay

## 2020-09-19 ENCOUNTER — Ambulatory Visit (HOSPITAL_COMMUNITY): Payer: Medicare Other | Attending: Cardiology

## 2020-09-19 DIAGNOSIS — I350 Nonrheumatic aortic (valve) stenosis: Secondary | ICD-10-CM | POA: Insufficient documentation

## 2020-09-19 LAB — ECHOCARDIOGRAM COMPLETE
AR max vel: 1.13 cm2
AV Area VTI: 1.18 cm2
AV Area mean vel: 1.1 cm2
AV Mean grad: 21 mmHg
AV Peak grad: 36.7 mmHg
Ao pk vel: 3.03 m/s
Area-P 1/2: 3.91 cm2
P 1/2 time: 482 msec
S' Lateral: 3.7 cm

## 2020-09-21 ENCOUNTER — Encounter (HOSPITAL_BASED_OUTPATIENT_CLINIC_OR_DEPARTMENT_OTHER): Payer: Self-pay | Admitting: Cardiovascular Disease

## 2020-09-21 ENCOUNTER — Ambulatory Visit (INDEPENDENT_AMBULATORY_CARE_PROVIDER_SITE_OTHER): Payer: Medicare Other | Admitting: Cardiovascular Disease

## 2020-09-21 ENCOUNTER — Other Ambulatory Visit: Payer: Self-pay

## 2020-09-21 VITALS — BP 154/78 | HR 72 | Ht 67.0 in | Wt 219.2 lb

## 2020-09-21 DIAGNOSIS — E7800 Pure hypercholesterolemia, unspecified: Secondary | ICD-10-CM

## 2020-09-21 DIAGNOSIS — E1169 Type 2 diabetes mellitus with other specified complication: Secondary | ICD-10-CM | POA: Diagnosis not present

## 2020-09-21 DIAGNOSIS — I10 Essential (primary) hypertension: Secondary | ICD-10-CM

## 2020-09-21 DIAGNOSIS — E669 Obesity, unspecified: Secondary | ICD-10-CM | POA: Diagnosis not present

## 2020-09-21 MED ORDER — CARVEDILOL 12.5 MG PO TABS: 12.5000 mg | ORAL_TABLET | Freq: Two times a day (BID) | ORAL | 3 refills | Status: DC

## 2020-09-21 NOTE — Assessment & Plan Note (Signed)
Currently on metformin.  Consider Ozempic for weight loss or SGLT2 inhibitor given his CAD.  We will hold off for now, as he wants to limit his medications.

## 2020-09-21 NOTE — Patient Instructions (Signed)
Medication Instructions:  INCREASE- Carvedilol 12.5 mg by mouth twice a day  *If you need a refill on your cardiac medications before your next appointment, please call your pharmacy*   Lab Work: None Ordered   Testing/Procedures: None Ordered   Follow-Up: At [facility], you and your health needs are our priority.  As part of our continuing mission to provide you with exceptional heart care, we have created designated Provider Care Teams.  These Care Teams include your primary Cardiologist (physician) and Advanced Practice Providers (APPs -  Physician Assistants and Nurse Practitioners) who all work together to provide you with the care you need, when you need it.  We recommend signing up for the patient portal called "MyChart".  Sign up information is provided on this After Visit Summary.  MyChart is used to connect with patients for Virtual Visits (Telemedicine).  Patients are able to view lab/test results, encounter notes, upcoming appointments, etc.  Non-urgent messages can be sent to your provider as well.   To learn more about what you can do with MyChart, go to [email or web address].    Your next appointment:   6 month(s)  The format for your next appointment:   In Person  Provider:   Skeet Latch, MD   Other Instructions Your physician recommends that you schedule a follow-up appointment in: 1 Month with Pharmacist for Blood pressure check  Keep a daily reading log of your blood pressure and bring in with your at your next office visit.

## 2020-09-21 NOTE — Assessment & Plan Note (Signed)
Lipids are well have been controlled.  LDL goal is less than 70.  Continue rosuvastatin.

## 2020-09-21 NOTE — Assessment & Plan Note (Signed)
Blood pressure poorly controlled both initially and on repeat.  We will have him increase his carvedilol to 12.5 mg twice daily.  He would like to avoid adding more medications if possible.  He is going to keep working on diet and exercise as well as weight loss.

## 2020-09-21 NOTE — Assessment & Plan Note (Signed)
He is doing very well status post CABG.  He is exercising regularly and has no exertional symptoms.  Continue aspirin, carvedilol, and rosuvastatin.

## 2020-09-21 NOTE — Progress Notes (Signed)
Cardiology Office Note  Date:  09/22/2020   ID:  Phillip [name], DOB 1950/10/16, MRN [id number]  PCP:  Phillip Alanis, NP  Cardiologist:   Phillip Latch, MD   No chief complaint on file.    History of Present Illness: Phillip [name] is a 70 y.o. male with CAD s/p CABG (LIMA-->LAD, SVG to OM, RI, PDA), mild to moderate aortic stenosis, hypertension, hyperlipidemia, mild ascending aortic aneurysm, and diabetes who presents for follow-up.  He was initially seen 04/2019 for a second opinion on coronary artery disease.   He initially had symptoms that he thought were attributable to hernia.  However his primary care provider was concerned that it may be ischemia.  He had an abnormal stress echo at [facility] on 05/25/19.  He achieved 7 METS and had chest pain with exercise.  There was 1 mm ST depression inferolaterally.  He developed hypokinesis in the LAD region and LV dilatation with stress.  He was referred to [facility] where high-sensitivity troponin was mildly elevated and flat (32, 36, 24).  LDL was 171 and hemoglobin A1c was 7.8%.  He underwent LHC, where he was found to have 3 vessel obstructive CAD, including proximal LAD occlusion with left to left collaterals.  He had occlusive disease in OM 2, OM1, mid to distal RCA, and PDA.  He also had right to left collaterals.  Echo revealed LVEF 50% with mild global hypokinesis and normal right ventricular function.  There is aortic valve thickening with restricted movement and mild to moderate aortic stenosis.  Dimensionless index 0.58 cm.  Mean gradient was not listed in the report.  The ascending aorta was mildly dilated at 4.1 cm.    Phillip [name] was referred to Dr. Prescott [name] and underwent CABG 06/2019.  He had an episode of syncope in the hospital postoperatively.  Otherwise his postoperative course was unremarkable.  He last saw Dr. Prescott [name] on 07/2019 and was cleared for cardiac rehab.  Amiodarone was discontinued at that time.  At his last  appointment he was doing well. He had a repeat Echo 09/2020 that revealed LVEF 55% and grade 2 diastolic dysfunction. He had moderate aortic stenosis with mean gradient 21 mmHg. Today, he has been feeling great overall. He was recently married in 06/2020 and traveled this summer. Lately he has not monitored his blood pressure at home. For exercise he tries to play tennis about 2-3 times a week for 1 to 1.5 hours. He also uses a treadmill sometimes. His goal is to drop below 200 pounds by the end of the year. He is scheduled for a colonoscopy and a physical later this year. He denies any palpitations, chest pain, or shortness of breath. No lightheadedness, headaches, syncope, orthopnea, or PND. Also has no lower extremity edema or exertional symptoms.   Past Medical History:  Diagnosis Date   Aortic stenosis    mild to moderate AS 05/2019 echo    CAD in native artery 06/09/2019   Diabetes mellitus type 2 in obese ([place]) 06/09/2019   Elevated hemoglobin A1c    Essential hypertension 06/09/2019   History of colonoscopy 01/08/2009   History of high cholesterol    Melanoma ([place])    back   Pure hypercholesterolemia 06/09/2019    Past Surgical History:  Procedure Laterality Date   CARDIAC SURGERY  06/30/2019   CATARACT EXTRACTION W/PHACO Left 09/01/2019   Procedure: CATARACT EXTRACTION PHACO AND INTRAOCULAR LENS PLACEMENT (IOC) LEFT 6.30  00:36.6;  Surgeon: Phillip [name],  Phillip Saxon, MD;  Location: [facility];  Service: Ophthalmology;  Laterality: Left;   CATARACT EXTRACTION W/PHACO Right 09/29/2019   Procedure: CATARACT EXTRACTION PHACO AND INTRAOCULAR LENS PLACEMENT (IOC) RIGHT DIABETIC 5.06 00:35.6;  Surgeon: Phillip Robson, MD;  Location: [place];  Service: Ophthalmology;  Laterality: Right;  Diabetic - oral meds   COLONOSCOPY  2003   COLONOSCOPY  01/08/2001   CORONARY ARTERY BYPASS GRAFT N/A 06/30/2019   Procedure: CORONARY ARTERY BYPASS GRAFTING (CABG) x 4, LIMA TO DIAGONAL 1, SVG TO OML,  SVG TO RAMUS, SVG TO PDA;  Surgeon: Phillip [name], Phillip Salina, MD;  Location: [place];  Service: Open Heart Surgery;  Laterality: N/A;   ENDOVEIN HARVEST OF GREATER SAPHENOUS VEIN Bilateral 06/30/2019   Procedure: ENDOVEIN HARVEST OF GREATER SAPHENOUS VEIN;  Surgeon: Phillip Poot, MD;  Location: [place];  Service: Open Heart Surgery;  Laterality: Bilateral;   MELANOMA EXCISION  01/09/2004   back   TEE WITHOUT CARDIOVERSION N/A 06/30/2019   Procedure: TRANSESOPHAGEAL ECHOCARDIOGRAM (TEE);  Surgeon: Phillip [name], Phillip Salina, MD;  Location: [place];  Service: Open Heart Surgery;  Laterality: N/A;   THYROGLOSSAL DUCT CYST  01/09/1999     Current Outpatient Medications  Medication Sig Dispense Refill   aspirin EC 81 MG tablet Take 81 mg by mouth daily. Swallow whole.     metFORMIN (GLUCOPHAGE) 500 MG tablet TAKE 1 TABLET BY MOUTH TWICE A DAY WITH A MEAL 180 tablet 2   Multiple Vitamins-Minerals (CENTRUM SILVER PO) Take by mouth daily.     rosuvastatin (CRESTOR) 40 MG tablet Take 1 tablet (40 mg total) by mouth at bedtime. 90 tablet 0   carvedilol (COREG) 12.5 MG tablet Take 1 tablet (12.5 mg total) by mouth 2 (two) times daily with a meal. 180 tablet 3   No current facility-administered medications for this visit.    Allergies:   Patient has no known allergies.    Social History:  The patient  reports that he quit smoking about 43 years ago. His smoking use included cigarettes. He has a 30.00 pack-year smoking history. He has never used smokeless tobacco. He reports current alcohol use of about 6.0 - 8.0 standard drinks per week.   Family History:  The patient's family history includes CAD in his father; Colon cancer in his father; Diabetes in his mother; Heart disease in his maternal grandfather, maternal uncle, and mother; Kidney failure in his mother; Valvular heart disease in his father.    ROS:   Please see the history of present illness. All other systems are reviewed and negative.    PHYSICAL  EXAM: VS:  BP (!) 154/78 (BP Location: Left Arm, Patient Position: Sitting)   Pulse 72   Ht '5\' 7"'$  (1.702 m)   Wt 219 lb 3.2 oz (99.4 kg)   BMI 34.33 kg/m  , BMI Body mass index is 34.33 kg/m. GENERAL:  Well appearing HEENT:  Pupils equal round and reactive, fundi not visualized, oral mucosa unremarkable NECK:  No jugular venous distention, waveform within normal limits, carotid upstroke brisk and symmetric, no bruits, no thyromegaly LYMPHATICS:  No cervical adenopathy LUNGS:  Clear to auscultation bilaterally HEART:  RRR.  PMI not displaced or sustained,S1 and S2 within normal limits, no S3, no S4, no clicks, no rubs, II/VI mid-peaking systolic murmur at the LUSB ABD:  Flat, positive bowel sounds normal in frequency in pitch, no bruits, no rebound, no guarding, no midline pulsatile mass, no hepatomegaly, no splenomegaly EXT:  2 plus pulses  throughout, no edema, no cyanosis no clubbing SKIN:  No rashes no nodules NEURO:  Cranial nerves II through XII grossly intact, motor grossly intact throughout PSYCH:  Cognitively intact, oriented to person place and time   EKG:   09/21/2020: Sinus rhythm. Rate 72 bpm. Prior anteroseptal infarct. 06/09/19: sinus rhythm.  Rate 82 bpm.  LAD.    Echo 09/19/2020:  1. Left ventricular ejection fraction, by estimation, is 55%. The left  ventricle has normal function. The left ventricle has no regional wall  motion abnormalities. Left ventricular diastolic parameters are consistent  with Grade II diastolic dysfunction  (pseudonormalization).   2. Right ventricular systolic function is mildly reduced. The right  ventricular size is normal. There is normal pulmonary artery systolic  pressure. The estimated right ventricular systolic pressure is [id number] mmHg.   3. Left atrial size was mildly dilated.   4. The mitral valve is normal in structure. Trivial mitral valve  regurgitation. No evidence of mitral stenosis.   5. The aortic valve is tricuspid. Aortic valve  regurgitation is trivial.  Moderate aortic valve stenosis. Aortic valve area, by VTI measures 1.18  cm. Aortic valve mean gradient measures 21.0 mmHg.   6. Aortic dilatation noted. There is mild dilatation of the ascending  aorta, measuring 41 mm.   7. The inferior vena cava is normal in size with greater than 50%  respiratory variability, suggesting right atrial pressure of 3 mmHg.  Intraoperative TEE   Left ventricle: Normal cavity size and wall thickness. Wall motion is  abnormal.    Left atrium: Left atrial appendage filling and emptying velocities are  normal.    Aortic valve: Moderate valve thickening present. Mild valve  calcification present. Mild stenosis. Mild regurgitation with a centrally  directed jet.    Mitral valve:  Mild mitral annular calcification. Mild regurgitation.  The annulus is mildy.    Right ventricle:  Normal ejection fraction.    Tricuspid valve: Mild regurgitation. The tricuspid valve regurgitation  jet is central.   Pre-CABG Carotid Doppler 06/25/2020: Summary:  Right Carotid: Velocities in the right ICA are consistent with a 1-39%  stenosis.   Left Carotid: The extracranial vessels were near-normal with only minimal  Wall thickening or plaque.   Vertebrals:  Bilateral vertebral arteries demonstrate antegrade flow.   Subclavians: Normal flow hemodynamics were seen in bilateral subclavian arteries.   Right ABI: Resting right ankle-brachial index is within normal range. No  evidence of significant right lower extremity arterial disease. PTA non  compressible.   Left ABI: Resting left ankle-brachial index is within normal range. No  evidence of significant left lower extremity arterial disease.   Bilateral Extremity: Doppler waveforms remain within normal limits with  compression bilaterally for the radial arteries. Doppler waveforms remain  within normal limits with compression bilaterally for the ulnar arteries.  Echo 05/26/19: SUMMARY Mild  left ventricular hypertrophy The left ventricle is mildly dilated. Left ventricular systolic function is mildly reduced. There is mild global hypokinesis of the left ventricle. The right ventricle is normal in size and function. The left atrial size is normal. Right atrial size is normal. Diffuse thickening of the aortic valve with restricted cusp opening. Diffuse calcification of the aortic valve. There is mild to moderate aortic stenosis. By visual inspection of the leaflets the stenosis appears moderate but Doppler gradients indicate mild. Clinical correlation suggested. There is mild mitral regurgitation. There is mild tricuspid regurgitation. Borderline dilated ascending aorta. There is no pericardial effusion.  LHC 05/26/19: Coronary  angiogram via right radial for high risk stress test.  LM  engaged, mild disease.  LAD has severe calcium and occluded ostial  segment.  LCx gives off high OM1 (obstructive) with obstructive disease  mid and obstructive OM2.  RCA engaged, obstructive prox, mid and distal  disease.  L-L and R-L collaterals present.  AV not crossed.  EBL < 5 cc,  prelude sync for hemostasis, no specimens.   Recent Labs: 11/17/2019: ALT 31; BUN 20; Creat 0.91; Hemoglobin 15.0; Platelets 182; Potassium 4.8; Sodium 138    Lipid Panel    Component Value Date/Time   CHOL 117 11/17/2019 0833   CHOL 112 08/18/2019 0940   TRIG 104 11/17/2019 0833   HDL 51 11/17/2019 0833   HDL 54 08/18/2019 0940   CHOLHDL 2.3 11/17/2019 0833   LDLCALC 47 11/17/2019 0833      Wt Readings from Last 3 Encounters:  09/21/20 219 lb 3.2 oz (99.4 kg)  11/02/19 221 lb 1.6 oz (100.3 kg)  10/07/19 220 lb 9.6 oz (100.1 kg)     ASSESSMENT AND PLAN: CAD in native artery He is doing very well status post CABG.  He is exercising regularly and has no exertional symptoms.  Continue aspirin, carvedilol, and rosuvastatin.   Hyperlipidemia Lipids are well have been controlled.  LDL goal is  less than 70.  Continue rosuvastatin.  Essential hypertension Blood pressure poorly controlled both initially and on repeat.  We will have him increase his carvedilol to 12.5 mg twice daily.  He would like to avoid adding more medications if possible.  He is going to keep working on diet and exercise as well as weight loss.  Diabetes mellitus type 2 in obese [facility]) Currently on metformin.  Consider Ozempic for weight loss or SGLT2 inhibitor given his CAD.  We will hold off for now, as he wants to limit his medications.  Current medicines are reviewed at length with the patient today.  The patient does not have concerns regarding medicines.  The following changes have been made: Start rosuvastatin and metoprolol.  Labs/ tests ordered today include:   Orders Placed This Encounter  Procedures   EKG 12-Lead      Disposition:    FU with PharmD in 1 month. FU with Elizabella Nolet C. Oval Linsey, MD, [facility] in 6 months.  I,Mathew Stumpf,acting as a Education administrator for Phillip Latch, MD.,have documented all relevant documentation on the behalf of Phillip Latch, MD,as directed by  Phillip Latch, MD while in the presence of Phillip Latch, MD.  I, [place] Oval Linsey, MD have reviewed all documentation for this visit.  The documentation of the exam, diagnosis, procedures, and orders on 09/22/2020 are all accurate and complete.   Signed, Chantel Teti C. Oval Linsey, MD, [facility]  09/22/2020 12:00 AM    [facility]

## 2020-09-22 ENCOUNTER — Encounter (HOSPITAL_BASED_OUTPATIENT_CLINIC_OR_DEPARTMENT_OTHER): Payer: Self-pay | Admitting: Cardiovascular Disease

## 2020-10-19 ENCOUNTER — Other Ambulatory Visit: Payer: Self-pay

## 2020-10-19 ENCOUNTER — Encounter (HOSPITAL_BASED_OUTPATIENT_CLINIC_OR_DEPARTMENT_OTHER): Payer: Self-pay | Admitting: Pharmacist Clinician (PhC)/ Clinical Pharmacy Specialist

## 2020-10-19 ENCOUNTER — Ambulatory Visit (INDEPENDENT_AMBULATORY_CARE_PROVIDER_SITE_OTHER): Payer: Medicare Other | Admitting: Pharmacist Clinician (PhC)/ Clinical Pharmacy Specialist

## 2020-10-19 DIAGNOSIS — I10 Essential (primary) hypertension: Secondary | ICD-10-CM

## 2020-10-19 NOTE — Progress Notes (Signed)
10/19/2020 Phillip [name] 24-Aug-1950 [id number]   HPI:  Phillip [name] is a 70 y.o. male patient of Dr Oval Linsey, with a [name] below who presents today for hypertension clinic evaluation.  When he saw Dr. Oval Linsey last month his pressure was noted to be 154/78.  She increased carvedilol to 12.5 mg twice daily.  It was noted that patient would like to avoid addition of medications, but wanted instead to work on lifestyle modifications.  He would also benefit from SGLT2 inhibitor for weight loss, should lifestyle modifications not get him to goal.    Today he returns for follow up.  He believes that his BP was running higher because of lifestyle changes.  He was married in May then did some travelling and was not able to stick to his normal diet or exercise routine.  He would prefer to control pressure without medications.    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, "not a canned food in my house", fresh fruits and vegetables  90% cheicken, no red meat   Exercise: tennis 2-3 days per week 90 min w/wife; has recumbant bike and treadmill, not using much  Home BP readings: new machine bought after TR appt; Omron arm cuff  16 home readings average 134/83 (range 121-153/72-96) - taken at various times of day  Intolerances: nkda  Labs: 11/21: Na 138, K 4.8, Glu 177, BUN 20, SCr 0.91, GFR 86   Wt Readings from Last 3 Encounters:  10/19/20 219 lb 5.7 oz (99.5 kg)  09/21/20 219 lb 3.2 oz (99.4 kg)  11/02/19 221 lb 1.6 oz (100.3 kg)   BP Readings from Last 3  Encounters:  10/19/20 (!) 152/82  09/21/20 (!) 154/78  11/02/19 122/82   Pulse Readings from Last 3 Encounters:  10/19/20 64  09/21/20 72  11/02/19 61    Current Outpatient Medications  Medication Sig Dispense Refill   aspirin EC 81 MG tablet Take 81 mg by mouth daily. Swallow whole.     carvedilol (COREG) 12.5 MG tablet Take 1 tablet (12.5 mg total) by mouth 2 (two) times daily with a meal. 180 tablet 3   metFORMIN (GLUCOPHAGE) 500 MG tablet TAKE 1 TABLET BY MOUTH TWICE A DAY WITH A MEAL 180 tablet 2   Multiple Vitamins-Minerals (CENTRUM SILVER PO) Take by mouth daily.     rosuvastatin (CRESTOR) 40 MG tablet Take 1 tablet (40 mg total) by mouth at bedtime. 90 tablet 0   No current facility-administered medications for this visit.    No Known Allergies  Past Medical History:  Diagnosis Date   Aortic stenosis    mild to moderate AS 05/2019 echo    CAD in native artery 06/09/2019   Diabetes mellitus type 2 in obese ([place]) 06/09/2019   Elevated hemoglobin A1c    Essential hypertension 06/09/2019   History of colonoscopy 01/08/2009   History of high cholesterol    Melanoma ([place])    back   Pure hypercholesterolemia 06/09/2019    Blood pressure (!) 152/82, pulse  64, height 5\' 7"  (1.702 m), weight 219 lb 5.7 oz (99.5 kg).  Essential hypertension Patient with essential hypertension, currently only on carvediolol 12.5 mg.  Patient not interested in taking any more medications, and home BP readings average to almost goal.  Encouraged him to continue with lifestyle modifications and monitoring home readings 3-4 times each week.  He should reach out to the office should home readings trend and stay elevated.     Tommy Medal PharmD CPP [facility] [address] [place], [place] [address] [phone]

## 2020-10-19 NOTE — Assessment & Plan Note (Signed)
Patient with essential hypertension, currently only on carvediolol 12.5 mg.  Patient not interested in taking any more medications, and home BP readings average to almost goal.  Encouraged him to continue with lifestyle modifications and monitoring home readings 3-4 times each week.  He should reach out to the office should home readings trend and stay elevated.

## 2020-10-19 NOTE — Patient Instructions (Signed)
Return for a a follow up appointment March 15 at 9 am  Check your blood pressure at home daily and keep record of the readings.  Take your BP meds as follows:  No changes to medication at this time  If you notice home BP readings trend and stay higher, please reach out to our office.    Bring all of your meds, your BP cuff and your record of home blood pressures to your next appointment.  Exercise as you're able, try to walk approximately 30 minutes per day.  Keep salt intake to a minimum, especially watch canned and prepared boxed foods.  Eat more fresh fruits and vegetables and fewer canned items.  Avoid eating in fast food restaurants.    HOW TO TAKE YOUR BLOOD PRESSURE: Rest 5 minutes before taking your blood pressure.  Don't smoke or drink caffeinated beverages for at least 30 minutes before. Take your blood pressure before (not after) you eat. Sit comfortably with your back supported and both feet on the floor (don't cross your legs). Elevate your arm to heart level on a table or a desk. Use the proper sized cuff. It should fit smoothly and snugly around your bare upper arm. There should be enough room to slip a fingertip under the cuff. The bottom edge of the cuff should be 1 inch above the crease of the elbow. Ideally, take 3 measurements at one sitting and record the average.

## 2020-11-04 ENCOUNTER — Ambulatory Visit (HOSPITAL_BASED_OUTPATIENT_CLINIC_OR_DEPARTMENT_OTHER): Payer: Medicare Other | Admitting: Cardiovascular Disease

## 2020-11-08 ENCOUNTER — Ambulatory Visit (HOSPITAL_BASED_OUTPATIENT_CLINIC_OR_DEPARTMENT_OTHER): Payer: Medicare Other | Admitting: Cardiovascular Disease

## 2020-11-10 ENCOUNTER — Other Ambulatory Visit: Payer: Self-pay

## 2020-11-10 ENCOUNTER — Other Ambulatory Visit: Payer: Self-pay | Admitting: Orthopedic Surgery

## 2020-11-10 ENCOUNTER — Encounter: Payer: Self-pay | Admitting: Orthopedic Surgery

## 2020-11-10 ENCOUNTER — Ambulatory Visit (INDEPENDENT_AMBULATORY_CARE_PROVIDER_SITE_OTHER): Payer: Medicare Other | Admitting: Orthopedic Surgery

## 2020-11-10 VITALS — BP 128/78 | HR 69 | Temp 98.7°F | Ht 67.0 in | Wt 217.4 lb

## 2020-11-10 DIAGNOSIS — I10 Essential (primary) hypertension: Secondary | ICD-10-CM

## 2020-11-10 DIAGNOSIS — Z6834 Body mass index (BMI) 34.0-34.9, adult: Secondary | ICD-10-CM

## 2020-11-10 DIAGNOSIS — Z1159 Encounter for screening for other viral diseases: Secondary | ICD-10-CM

## 2020-11-10 DIAGNOSIS — E7800 Pure hypercholesterolemia, unspecified: Secondary | ICD-10-CM

## 2020-11-10 DIAGNOSIS — R5383 Other fatigue: Secondary | ICD-10-CM

## 2020-11-10 DIAGNOSIS — Z23 Encounter for immunization: Secondary | ICD-10-CM

## 2020-11-10 DIAGNOSIS — Z85820 Personal history of malignant melanoma of skin: Secondary | ICD-10-CM

## 2020-11-10 DIAGNOSIS — E1165 Type 2 diabetes mellitus with hyperglycemia: Secondary | ICD-10-CM | POA: Diagnosis not present

## 2020-11-10 DIAGNOSIS — N529 Male erectile dysfunction, unspecified: Secondary | ICD-10-CM

## 2020-11-10 DIAGNOSIS — Z800 Family history of malignant neoplasm of digestive organs: Secondary | ICD-10-CM

## 2020-11-10 DIAGNOSIS — I2510 Atherosclerotic heart disease of native coronary artery without angina pectoris: Secondary | ICD-10-CM

## 2020-11-10 DIAGNOSIS — N528 Other male erectile dysfunction: Secondary | ICD-10-CM

## 2020-11-10 MED ORDER — SILDENAFIL CITRATE 50 MG PO TABS: 50.0000 mg | ORAL_TABLET | Freq: Every day | ORAL | 2 refills | Status: DC | PRN

## 2020-11-10 NOTE — Progress Notes (Signed)
Prescription for Viagra sent to pharmacy. Drug education and precautions discussed with patient.

## 2020-11-10 NOTE — Progress Notes (Signed)
Careteam: Patient Care Team: Yvonna Alanis, NP as PCP - General (Adult Health Nurse Practitioner) Skeet Latch, MD as PCP - Cardiology (Cardiology) Skeet Latch, MD as Attending Physician (Cardiology) Dingeldein, Remo Lipps, MD (Ophthalmology) Bloomfeld, Sharen Hint, MD as Referring Physician (Gastroenterology)  Seen by: Windell Moulding, AGNP-C  PLACE OF SERVICE:  [place] Directive information    No Known Allergies  Chief Complaint  Patient presents with   Medical Management of Chronic Issues    Patient present today for follow-up. He was last seen on 11/02/2019 as a new patient.      HPI: Patient is a 70 y.o. male seen today for medical management of chronic conditions.   Fasting for labs today.   He got married and bought a new house since last visit.   He had colonoscopy done 11/01/2020 with [facility]. He reports 15 non cancerous polyps, advised to repeat study in 2 years.   Dermatology yearly screening last week. Denies any suspicious lesions.   She Dr. Oval Linsey in September. Carvedilol increased to 12.5 mg. He is taking medication as prescribed. Checks blood pressures at home. Follows low sodium diet.   Reports some issues with having erection. Interested in trying medications. Treatment options and risks discussed. Will reach out to Dr. Oval Linsey for her perspective.   He is interested in loosing weight. He would like to be off metformin. Plans to cut calories and wine.   Play tennis about 2-3 times a week with wife. Also swims and uses home gym equipment.   Denies stress. Continues to work 2 jobs.   Plan to get shingles vaccine.   Does not want additional covid vaccinations.      Review of Systems:  Review of Systems  Constitutional:  Positive for malaise/fatigue. Negative for chills, fever and weight loss.  HENT: Negative.    Eyes: Negative.   Respiratory:  Negative for cough, shortness of breath and wheezing.   Cardiovascular:  Negative  for chest pain, orthopnea and leg swelling.  Gastrointestinal: Negative.   Genitourinary: Negative.   Musculoskeletal: Negative.   Skin: Negative.   Neurological:  Negative for dizziness, weakness and headaches.  Psychiatric/Behavioral:  Negative for depression. The patient is not nervous/anxious and does not have insomnia.    Past Medical History:  Diagnosis Date   Aortic stenosis    mild to moderate AS 05/2019 echo    CAD in native artery 06/09/2019   Diabetes mellitus type 2 in obese ([place]) 06/09/2019   Elevated hemoglobin A1c    Essential hypertension 06/09/2019   History of colonoscopy 01/08/2009   History of high cholesterol    Melanoma ([place])    back   Pure hypercholesterolemia 06/09/2019   Past Surgical History:  Procedure Laterality Date   CARDIAC SURGERY  06/30/2019   CATARACT EXTRACTION W/PHACO Left 09/01/2019   Procedure: CATARACT EXTRACTION PHACO AND INTRAOCULAR LENS PLACEMENT ([place]) LEFT 6.30  00:36.6;  Surgeon: Birder Robson, MD;  Location: [facility];  Service: Ophthalmology;  Laterality: Left;   CATARACT EXTRACTION W/PHACO Right 09/29/2019   Procedure: CATARACT EXTRACTION PHACO AND INTRAOCULAR LENS PLACEMENT ([facility]) RIGHT DIABETIC 5.06 00:35.6;  Surgeon: Birder Robson, MD;  Location: [facility];  Service: Ophthalmology;  Laterality: Right;  Diabetic - oral meds   COLONOSCOPY  2003   COLONOSCOPY  01/08/2001   CORONARY ARTERY BYPASS GRAFT N/A 06/30/2019   Procedure: CORONARY ARTERY BYPASS GRAFTING (CABG) x 4, LIMA TO DIAGONAL 1, SVG TO OML, SVG TO RAMUS, SVG TO  PDA;  Surgeon: Prescott Gum, Collier Salina, MD;  Location: [facility];  Service: Open Heart Surgery;  Laterality: N/A;   ENDOVEIN HARVEST OF GREATER SAPHENOUS VEIN Bilateral 06/30/2019   Procedure: ENDOVEIN HARVEST OF GREATER SAPHENOUS VEIN;  Surgeon: Ivin Poot, MD;  Location: [facility];  Service: Open Heart Surgery;  Laterality: Bilateral;   MELANOMA EXCISION  01/09/2004   back   TEE WITHOUT CARDIOVERSION N/A  06/30/2019   Procedure: TRANSESOPHAGEAL ECHOCARDIOGRAM (TEE);  Surgeon: Prescott Gum, Collier Salina, MD;  Location: [facility];  Service: Open Heart Surgery;  Laterality: N/A;   THYROGLOSSAL DUCT CYST  01/09/1999   Social History:   reports that he quit smoking about 43 years ago. His smoking use included cigarettes. He has a 30.00 pack-year smoking history. He has never used smokeless tobacco. He reports current alcohol use of about 6.0 - 8.0 standard drinks per week. No history on file for drug use.  Family History  Problem Relation Age of Onset   CAD Father    Valvular heart disease Father    Colon cancer Father    Heart disease Maternal Uncle    Heart disease Maternal Grandfather    Kidney failure Mother    Diabetes Mother    Heart disease Mother     Medications: Patient's Medications  New Prescriptions   No medications on file  Previous Medications   ASPIRIN EC 81 MG TABLET    Take 81 mg by mouth daily. Swallow whole.   CARVEDILOL (COREG) 12.5 MG TABLET    Take 1 tablet (12.5 mg total) by mouth 2 (two) times daily with a meal.   METFORMIN (GLUCOPHAGE) 500 MG TABLET    TAKE 1 TABLET BY MOUTH TWICE A DAY WITH A MEAL   MULTIPLE VITAMINS-MINERALS (CENTRUM SILVER PO)    Take by mouth daily.   ROSUVASTATIN (CRESTOR) 40 MG TABLET    Take 1 tablet (40 mg total) by mouth at bedtime.  Modified Medications   No medications on file  Discontinued Medications   No medications on file    Physical Exam:  Vitals:   11/10/20 0831  BP: 128/78  Pulse: 69  Temp: 98.7 F (37.1 C)  SpO2: 98%  Weight: 217 lb 6.4 oz (98.6 kg)  Height: 5\' 7"  (1.702 m)   Body mass index is 34.05 kg/m. Wt Readings from Last 3 Encounters:  11/10/20 217 lb 6.4 oz (98.6 kg)  10/19/20 219 lb 5.7 oz (99.5 kg)  09/21/20 219 lb 3.2 oz (99.4 kg)    Physical Exam Vitals reviewed.  Constitutional:      General: He is not in acute distress. HENT:     Head: Normocephalic.  Eyes:     General:        Right eye: No  discharge.        Left eye: No discharge.  Neck:     Thyroid: No thyroid mass or thyromegaly.     Vascular: No carotid bruit.  Cardiovascular:     Rate and Rhythm: Normal rate and regular rhythm.     Pulses: Normal pulses.     Heart sounds: Normal heart sounds. No murmur heard. Pulmonary:     Effort: Pulmonary effort is normal. No respiratory distress.     Breath sounds: Normal breath sounds. No wheezing.  Abdominal:     General: Bowel sounds are normal. There is no distension.     Palpations: Abdomen is soft.     Tenderness: There is no abdominal tenderness.  Musculoskeletal:  Cervical back: Normal range of motion.     Right lower leg: No edema.     Left lower leg: No edema.  Lymphadenopathy:     Cervical: No cervical adenopathy.  Skin:    General: Skin is warm and dry.     Capillary Refill: Capillary refill takes less than 2 seconds.  Neurological:     General: No focal deficit present.     Mental Status: He is alert and oriented to person, place, and time.  Psychiatric:        Mood and Affect: Mood normal.        Behavior: Behavior normal.    Labs reviewed: Basic Metabolic Panel: Recent Labs    11/17/19 0833  NA 138  K 4.8  CL 103  CO2 30  GLUCOSE 177*  BUN 20  CREATININE 0.91  CALCIUM 10.2   Liver Function Tests: Recent Labs    11/17/19 0833  AST 28  ALT 31  BILITOT 0.8  PROT 6.8   No results for input(s): LIPASE, AMYLASE in the last 8760 hours. No results for input(s): AMMONIA in the last 8760 hours. CBC: Recent Labs    11/17/19 0833  WBC 5.5  NEUTROABS 3,872  HGB 15.0  HCT 44.9  MCV 89.1  PLT 182   Lipid Panel: Recent Labs    11/17/19 0833  CHOL 117  HDL 51  LDLCALC 47  TRIG 104  CHOLHDL 2.3   TSH: No results for input(s): TSH in the last 8760 hours. A1C: Lab Results  Component Value Date   HGBA1C 7.0 (H) 11/17/2019     Assessment/Plan 1. Need for influenza vaccination - Flu Vaccine QUAD High Dose(Fluad)  2.  Uncontrolled type 2 diabetes mellitus with hyperglycemia (HCC) - A1c 7.0 11/2019 - cont metformin - Hemoglobin A1c - cont diet that limits carbs and sugars - foot exam next visit  3. Essential hypertension - controlled - followed by cardiology - cont carvedilol - CBC with Differential/Platelet - CMP  4. Pure hypercholesterolemia - LDL 47 11/2019 - cont crestor - cont diet low in fat - Lipid Panel  5. CAD in native artery - cont asa and statin  6. History of melanoma - yearly exam last week with dermatology - cont sunscreen use daily  7. Family history of colon cancer - colonoscopy last week with [facility] - reports 15 non-cancerous polyps - repeat study in 2 years  74. Fatigue, unspecified type - reports feeling tired more - TSH  9. Need for hepatitis C screening test - Hep C Antibody  10. BMI 34.0-34.9 - discussed weight loss options - recommend doing food diary and counting calories - recommend cutting back on wine - try to keep caloric intake < 2000 calories/daily  Total time: 35 minutes. Greater than 50% of total time spent doing patient education on medication management, and weight loss.    Next appt:  [place] [phone]

## 2020-11-11 ENCOUNTER — Encounter: Payer: Self-pay | Admitting: Orthopedic Surgery

## 2020-11-11 LAB — CBC WITH DIFFERENTIAL/PLATELET
Absolute Monocytes: 589 cells/uL (ref 200–950)
Basophils Absolute: 28 cells/uL (ref 0–200)
Basophils Relative: 0.5 %
Eosinophils Absolute: 110 cells/uL (ref 15–500)
Eosinophils Relative: 2 %
HCT: 45 % (ref 38.5–50.0)
Hemoglobin: 15.4 g/dL (ref 13.2–17.1)
Lymphs Abs: 924 cells/uL (ref 850–3900)
MCH: 31.8 pg (ref 27.0–33.0)
MCHC: 34.2 g/dL (ref 32.0–36.0)
MCV: 92.8 fL (ref 80.0–100.0)
MPV: 10.9 fL (ref 7.5–12.5)
Monocytes Relative: 10.7 %
Neutro Abs: 3850 cells/uL (ref 1500–7800)
Neutrophils Relative %: 70 %
Platelets: 169 10*3/uL (ref 140–400)
RBC: 4.85 10*6/uL (ref 4.20–5.80)
RDW: 12.6 % (ref 11.0–15.0)
Total Lymphocyte: 16.8 %
WBC: 5.5 10*3/uL (ref 3.8–10.8)

## 2020-11-11 LAB — COMPREHENSIVE METABOLIC PANEL
AG Ratio: 2 (calc) (ref 1.0–2.5)
ALT: 35 U/L (ref 9–46)
AST: 27 U/L (ref 10–35)
Albumin: 4.3 g/dL (ref 3.6–5.1)
Alkaline phosphatase (APISO): 78 U/L (ref 35–144)
BUN: 19 mg/dL (ref 7–25)
CO2: 28 mmol/L (ref 20–32)
Calcium: 9.8 mg/dL (ref 8.6–10.3)
Chloride: 103 mmol/L (ref 98–110)
Creat: 0.87 mg/dL (ref 0.70–1.35)
Globulin: 2.1 g/dL (calc) (ref 1.9–3.7)
Glucose, Bld: 165 mg/dL — ABNORMAL HIGH (ref 65–99)
Potassium: 4.4 mmol/L (ref 3.5–5.3)
Sodium: 139 mmol/L (ref 135–146)
Total Bilirubin: 0.7 mg/dL (ref 0.2–1.2)
Total Protein: 6.4 g/dL (ref 6.1–8.1)

## 2020-11-11 LAB — HEMOGLOBIN A1C
Hgb A1c MFr Bld: 6.2 % of total Hgb — ABNORMAL HIGH (ref <5.7)
Mean Plasma Glucose: 131 mg/dL
eAG (mmol/L): 7.3 mmol/L

## 2020-11-11 LAB — LIPID PANEL
Cholesterol: 111 mg/dL (ref <200)
HDL: 52 mg/dL (ref >=40)
LDL Cholesterol (Calc): 42 mg/dL (calc)
Non-HDL Cholesterol (Calc): 59 mg/dL (calc) (ref <130)
Total CHOL/HDL Ratio: 2.1 (calc) (ref <5.0)
Triglycerides: 90 mg/dL (ref <150)

## 2020-11-11 LAB — TSH: TSH: 1.05 mIU/L (ref 0.40–4.50)

## 2020-11-11 LAB — HEPATITIS C ANTIBODY
Hepatitis C Ab: NONREACTIVE
SIGNAL TO CUT-OFF: 0.05 (ref <1.00)

## 2020-12-12 ENCOUNTER — Other Ambulatory Visit (HOSPITAL_BASED_OUTPATIENT_CLINIC_OR_DEPARTMENT_OTHER): Payer: Self-pay | Admitting: *Deleted

## 2020-12-12 MED ORDER — ROSUVASTATIN CALCIUM 40 MG PO TABS: 40.0000 mg | ORAL_TABLET | Freq: Every day | ORAL | 2 refills | Status: DC

## 2020-12-12 MED ORDER — METFORMIN HCL 500 MG PO TABS: 500.0000 mg | ORAL_TABLET | Freq: Two times a day (BID) | ORAL | 2 refills | Status: DC

## 2021-03-16 ENCOUNTER — Ambulatory Visit: Payer: Medicare Other | Admitting: Orthopedic Surgery

## 2021-03-20 NOTE — Progress Notes (Incomplete)
Cardiology Office Note  Date:  03/20/2021   ID:  Rider Ermis, DOB 08/17/1950, MRN [id number]  PCP:  Yvonna Alanis, NP  Cardiologist:   Orion Crook   No chief complaint on file.   History of Present Illness: Merric Yost is a 71 y.o. male with CAD s/p CABG (LIMA-->LAD, SVG to OM, RI, PDA), mild to moderate aortic stenosis, hypertension, hyperlipidemia, mild ascending aortic aneurysm, and diabetes who presents for follow-up. He was initially seen 04/2019 for a second opinion on coronary artery disease.   He initially had symptoms that he thought were attributable to hernia.  However his primary care provider was concerned that it may be ischemia.  He had an abnormal stress echo at [facility] on 05/25/19.  He achieved 7 METS and had chest pain with exercise. There was 1 mm ST depression inferolaterally.  He developed hypokinesis in the LAD region and LV dilatation with stress.  He was referred to [facility] where high-sensitivity troponin was mildly elevated and flat (32, 36, 24). LDL was 171 and hemoglobin A1c was 7.8%. He underwent LHC, where he was found to have 3 vessel obstructive CAD, including proximal LAD occlusion with left to left collaterals. He had occlusive disease in OM 2, OM1, mid to distal RCA, and PDA. He also had right to left collaterals. Echo revealed LVEF 50% with mild global hypokinesis and normal right ventricular function. There is aortic valve thickening with restricted movement and mild to moderate aortic stenosis.  Dimensionless index 0.58 cm. Mean gradient was not listed in the report.  The ascending aorta was mildly dilated at 4.1 cm.    Mr. Ruta was referred to Dr. Prescott Gum and underwent CABG 06/2019.  He had an episode of syncope in the hospital postoperatively.  Otherwise his postoperative course was unremarkable. He last saw Dr. Prescott Gum on 07/2019 and was cleared for cardiac rehab. Amiodarone was discontinued at that time. At his last appointment he was  doing well. He had a repeat Echo 09/2020 that revealed LVEF 55% and grade 2 diastolic dysfunction. He had moderate aortic stenosis with mean gradient 21 mmHg. At his last appointment, he was doing well but his blood pressure was poorly controlled. His carvedilol was increased to 12.5 mg twice daily and he was continuing to work on diet and exercise. Today, he has been feeling great overall. He was recently married in 06/2020 and traveled this summer. Lately he has not monitored his blood pressure at home. For exercise he tries to play tennis about 2-3 times a week for 1 to 1.5 hours. He also uses a treadmill sometimes. His goal is to drop below 200 pounds by the end of the year. He is scheduled for a colonoscopy and a physical later this year. He denies any palpitations, chest pain, or shortness of breath. No lightheadedness, headaches, syncope, orthopnea, or PND. Also has no lower extremity edema or exertional symptoms.  Today, ***  He denies any palpitations, chest pain, or shortness of breath, lightheadedness, headaches, syncope, orthopnea, PND, lower extremity edema or exertional symptoms.  Past Medical History:  Diagnosis Date   Aortic stenosis    mild to moderate AS 05/2019 echo    CAD in native artery 06/09/2019   Diabetes mellitus type 2 in obese ([place]) 06/09/2019   Elevated hemoglobin A1c    Essential hypertension 06/09/2019   History of colonoscopy 01/08/2009   History of high cholesterol    Melanoma ([place])    back  Pure hypercholesterolemia 06/09/2019    Past Surgical History:  Procedure Laterality Date   CARDIAC SURGERY  06/30/2019   CATARACT EXTRACTION W/PHACO Left 09/01/2019   Procedure: CATARACT EXTRACTION PHACO AND INTRAOCULAR LENS PLACEMENT ([facility]) LEFT 6.30  00:36.6;  Surgeon: Birder Robson, MD;  Location: [place];  Service: Ophthalmology;  Laterality: Left;   CATARACT EXTRACTION W/PHACO Right 09/29/2019   Procedure: CATARACT EXTRACTION PHACO AND INTRAOCULAR LENS  PLACEMENT ([facility]) RIGHT DIABETIC 5.06 00:35.6;  Surgeon: Birder Robson, MD;  Location: [place];  Service: Ophthalmology;  Laterality: Right;  Diabetic - oral meds   COLONOSCOPY  2003   COLONOSCOPY  01/08/2001   CORONARY ARTERY BYPASS GRAFT N/A 06/30/2019   Procedure: CORONARY ARTERY BYPASS GRAFTING (CABG) x 4, LIMA TO DIAGONAL 1, SVG TO OML, SVG TO RAMUS, SVG TO PDA;  Surgeon: Prescott Gum, Collier Salina, MD;  Location: [facility];  Service: Open Heart Surgery;  Laterality: N/A;   ENDOVEIN HARVEST OF GREATER SAPHENOUS VEIN Bilateral 06/30/2019   Procedure: ENDOVEIN HARVEST OF GREATER SAPHENOUS VEIN;  Surgeon: Ivin Poot, MD;  Location: [place];  Service: Open Heart Surgery;  Laterality: Bilateral;   MELANOMA EXCISION  01/09/2004   back   TEE WITHOUT CARDIOVERSION N/A 06/30/2019   Procedure: TRANSESOPHAGEAL ECHOCARDIOGRAM (TEE);  Surgeon: Prescott Gum, Collier Salina, MD;  Location: [place];  Service: Open Heart Surgery;  Laterality: N/A;   THYROGLOSSAL DUCT CYST  01/09/1999     Current Outpatient Medications  Medication Sig Dispense Refill   aspirin EC 81 MG tablet Take 81 mg by mouth daily. Swallow whole.     carvedilol (COREG) 12.5 MG tablet Take 1 tablet (12.5 mg total) by mouth 2 (two) times daily with a meal. 180 tablet 3   metFORMIN (GLUCOPHAGE) 500 MG tablet Take 1 tablet (500 mg total) by mouth 2 (two) times daily with a meal. 180 tablet 2   Multiple Vitamins-Minerals (CENTRUM SILVER PO) Take by mouth daily.     rosuvastatin (CRESTOR) 40 MG tablet Take 1 tablet (40 mg total) by mouth at bedtime. 90 tablet 2   sildenafil (VIAGRA) 50 MG tablet Take 1 tablet (50 mg total) by mouth daily as needed for erectile dysfunction. 10 tablet 2   No current facility-administered medications for this visit.    Allergies:   Patient has no known allergies.    Social History:  The patient  reports that he quit smoking about 44 years ago. His smoking use included cigarettes. He has a 30.00 pack-year smoking  history. He has never used smokeless tobacco. He reports current alcohol use of about 6.0 - 8.0 standard drinks per week.   Family History:  The patient's family history includes CAD in his father; Colon cancer in his father; Diabetes in his mother; Heart disease in his maternal grandfather, maternal uncle, and mother; Kidney failure in his mother; Valvular heart disease in his father.    ROS:   Please see the history of present illness. All other systems are reviewed and negative.    PHYSICAL EXAM: VS:  There were no vitals taken for this visit. , BMI There is no height or weight on file to calculate BMI. GENERAL:  Well appearing HEENT:  Pupils equal round and reactive, fundi not visualized, oral mucosa unremarkable NECK:  No jugular venous distention, waveform within normal limits, carotid upstroke brisk and symmetric, no bruits, no thyromegaly LYMPHATICS:  No cervical adenopathy LUNGS:  Clear to auscultation bilaterally HEART:  RRR.  PMI not displaced or sustained,S1 and  S2 within normal limits, no S3, no S4, no clicks, no rubs, ***II/VI mid-peaking systolic murmur at the LUSB ABD:  Flat, positive bowel sounds normal in frequency in pitch, no bruits, no rebound, no guarding, no midline pulsatile mass, no hepatomegaly, no splenomegaly EXT:  2 plus pulses throughout, no edema, no cyanosis no clubbing SKIN:  No rashes no nodules NEURO:  Cranial nerves II through XII grossly intact, motor grossly intact throughout PSYCH:  Cognitively intact, oriented to person place and time   EKG:  EKG was not ordered today 09/21/20: Sinus rhythm. Rate 72 bpm. Prior anteroseptal infarct. 06/09/19: sinus rhythm.  Rate 82 bpm.  LAD.    Echo 09/19/2020:  1. Left ventricular ejection fraction, by estimation, is 55%. The left  ventricle has normal function. The left ventricle has no regional wall  motion abnormalities. Left ventricular diastolic parameters are consistent  with Grade II diastolic dysfunction   (pseudonormalization).   2. Right ventricular systolic function is mildly reduced. The right  ventricular size is normal. There is normal pulmonary artery systolic  pressure. The estimated right ventricular systolic pressure is 84.1 mmHg.   3. Left atrial size was mildly dilated.   4. The mitral valve is normal in structure. Trivial mitral valve  regurgitation. No evidence of mitral stenosis.   5. The aortic valve is tricuspid. Aortic valve regurgitation is trivial.  Moderate aortic valve stenosis. Aortic valve area, by VTI measures 1.18  cm. Aortic valve mean gradient measures 21.0 mmHg.   6. Aortic dilatation noted. There is mild dilatation of the ascending  aorta, measuring 41 mm.   7. The inferior vena cava is normal in size with greater than 50%  respiratory variability, suggesting right atrial pressure of 3 mmHg.  Intraoperative TEE 06/30/19   Left ventricle: Normal cavity size and wall thickness. Wall motion is  abnormal.    Left atrium: Left atrial appendage filling and emptying velocities are  normal.    Aortic valve: Moderate valve thickening present. Mild valve  calcification present. Mild stenosis. Mild regurgitation with a centrally  directed jet.    Mitral valve:  Mild mitral annular calcification. Mild regurgitation.  The annulus is mildy.    Right ventricle:  Normal ejection fraction.    Tricuspid valve: Mild regurgitation. The tricuspid valve regurgitation  jet is central.   Pre-CABG Carotid Doppler 06/25/2020: Summary:  Right Carotid: Velocities in the right ICA are consistent with a 1-39%  stenosis.   Left Carotid: The extracranial vessels were near-normal with only minimal  Wall thickening or plaque.   Vertebrals:  Bilateral vertebral arteries demonstrate antegrade flow.   Subclavians: Normal flow hemodynamics were seen in bilateral subclavian arteries.   Right ABI: Resting right ankle-brachial index is within normal range. No  evidence of significant  right lower extremity arterial disease. PTA non  compressible.   Left ABI: Resting left ankle-brachial index is within normal range. No  evidence of significant left lower extremity arterial disease.   Bilateral Extremity: Doppler waveforms remain within normal limits with  compression bilaterally for the radial arteries. Doppler waveforms remain  within normal limits with compression bilaterally for the ulnar arteries.  Echo 05/26/19: SUMMARY Mild left ventricular hypertrophy The left ventricle is mildly dilated. Left ventricular systolic function is mildly reduced. There is mild global hypokinesis of the left ventricle. The right ventricle is normal in size and function. The left atrial size is normal. Right atrial size is normal. Diffuse thickening of the aortic valve with restricted cusp opening.  Diffuse calcification of the aortic valve. There is mild to moderate aortic stenosis. By visual inspection of the leaflets the stenosis appears moderate but Doppler gradients indicate mild. Clinical correlation suggested. There is mild mitral regurgitation. There is mild tricuspid regurgitation. Borderline dilated ascending aorta. There is no pericardial effusion.  LHC 05/26/19: Coronary angiogram via right radial for high risk stress test.  LM  engaged, mild disease.  LAD has severe calcium and occluded ostial  segment.  LCx gives off high OM1 (obstructive) with obstructive disease  mid and obstructive OM2.  RCA engaged, obstructive prox, mid and distal  disease.  L-L and R-L collaterals present.  AV not crossed.  EBL < 5 cc,  prelude sync for hemostasis, no specimens.   Recent Labs: 11/10/2020: ALT 35; BUN 19; Creat 0.87; Hemoglobin 15.4; Platelets 169; Potassium 4.4; Sodium 139; TSH 1.05    Lipid Panel    Component Value Date/Time   CHOL 111 11/10/2020 0910   CHOL 112 08/18/2019 0940   TRIG 90 11/10/2020 0910   HDL 52 11/10/2020 0910   HDL 54 08/18/2019 0940   CHOLHDL 2.1  11/10/2020 0910   LDLCALC 42 11/10/2020 0910      Wt Readings from Last 3 Encounters:  11/10/20 217 lb 6.4 oz (98.6 kg)  10/19/20 219 lb 5.7 oz (99.5 kg)  09/21/20 219 lb 3.2 oz (99.4 kg)     ASSESSMENT AND PLAN: No problem-specific Assessment & Plan notes found for this encounter.   Current medicines are reviewed at length with the patient today.  The patient does not have concerns regarding medicines.  The following changes have been made: Start rosuvastatin and metoprolol.  Labs/ tests ordered today include:   No orders of the defined types were placed in this encounter.     Disposition:    FU with PharmD in *** FU with Tiffany C. Oval Linsey, MD, Operating Room Services in ***  [place] as a scribe for Skeet Latch, MD.,have documented all relevant documentation on the behalf of Skeet Latch, MD,as directed by  Skeet Latch, MD while in the presence of Skeet Latch, MD.  ***  Signed, Tiffany C. Oval Linsey, MD, [facility]  03/20/2021 11:51 AM    [place]

## 2021-03-21 ENCOUNTER — Other Ambulatory Visit: Payer: Self-pay

## 2021-03-21 ENCOUNTER — Ambulatory Visit (INDEPENDENT_AMBULATORY_CARE_PROVIDER_SITE_OTHER): Payer: Medicare Other | Admitting: Cardiovascular Disease

## 2021-03-21 ENCOUNTER — Encounter (HOSPITAL_BASED_OUTPATIENT_CLINIC_OR_DEPARTMENT_OTHER): Payer: Self-pay | Admitting: Cardiovascular Disease

## 2021-03-21 DIAGNOSIS — E7800 Pure hypercholesterolemia, unspecified: Secondary | ICD-10-CM

## 2021-03-21 DIAGNOSIS — I10 Essential (primary) hypertension: Secondary | ICD-10-CM

## 2021-03-21 DIAGNOSIS — I2510 Atherosclerotic heart disease of native coronary artery without angina pectoris: Secondary | ICD-10-CM

## 2021-03-21 NOTE — Patient Instructions (Signed)
Medication Instructions:  ?Your physician recommends that you continue on your current medications as directed. Please refer to the Current Medication list given to you today.  ? ?*If you need a refill on your cardiac medications before your next appointment, please call your pharmacy* ? ?Lab Work: ?NONE  ? ?Testing/Procedures: ?NONE  ? ?Follow-Up: ?At [facility], you and your health needs are our priority.  As part of our continuing mission to provide you with exceptional heart care, we have created designated Provider Care Teams.  These Care Teams include your primary Cardiologist (physician) and Advanced Practice Providers (APPs -  Physician Assistants and Nurse Practitioners) who all work together to provide you with the care you need, when you need it. ? ?We recommend signing up for the patient portal called "MyChart".  Sign up information is provided on this After Visit Summary.  MyChart is used to connect with patients for Virtual Visits (Telemedicine).  Patients are able to view lab/test results, encounter notes, upcoming appointments, etc.  Non-urgent messages can be sent to your provider as well.   ?To learn more about what you can do with MyChart, go to [email or web address].   ? ?Your next appointment:   ?1} ?04/24/2021 at 8:40 am  ? ?Other Instructions ? ?Exercise recommendations: ?The American Heart Association recommends 150 minutes of moderate intensity exercise weekly. ?Try 30 minutes of moderate intensity exercise 4-5 times per week. ?This could include walking, jogging, or swimming. ? ? ?

## 2021-03-21 NOTE — Assessment & Plan Note (Signed)
To be existing weight gain.  He notes that before he gained weight.  He is hesitant to any medications.  He will blood work on diet and exercise for 30 days and we will follow-up in 1 month.  Continue carvedilol for now. ?

## 2021-03-21 NOTE — Assessment & Plan Note (Signed)
Lipids are very well controlled.  LDL was 42 on 11/2020.  Continue atorvastatin.  Keep working on diet and exercise as above. ?

## 2021-03-21 NOTE — Assessment & Plan Note (Signed)
Prior CABG.  He is started back exercising and playing tennis and has no anginal symptoms.  Lipids are well controlled.  Continue aspirin, carvedilol, and rosuvastatin. ?

## 2021-03-22 ENCOUNTER — Ambulatory Visit (HOSPITAL_BASED_OUTPATIENT_CLINIC_OR_DEPARTMENT_OTHER): Payer: Medicare Other | Admitting: Cardiovascular Disease

## 2021-04-24 ENCOUNTER — Ambulatory Visit (INDEPENDENT_AMBULATORY_CARE_PROVIDER_SITE_OTHER): Payer: Medicare Other | Admitting: Cardiovascular Disease

## 2021-04-24 ENCOUNTER — Encounter (HOSPITAL_BASED_OUTPATIENT_CLINIC_OR_DEPARTMENT_OTHER): Payer: Self-pay | Admitting: Cardiovascular Disease

## 2021-04-24 DIAGNOSIS — I10 Essential (primary) hypertension: Secondary | ICD-10-CM | POA: Diagnosis not present

## 2021-04-24 DIAGNOSIS — I2510 Atherosclerotic heart disease of native coronary artery without angina pectoris: Secondary | ICD-10-CM | POA: Diagnosis not present

## 2021-04-24 DIAGNOSIS — E7800 Pure hypercholesterolemia, unspecified: Secondary | ICD-10-CM | POA: Diagnosis not present

## 2021-04-24 NOTE — Assessment & Plan Note (Signed)
Lipids are very well controlled on rosuvastatin.  Continue current dose.  Continue to increase exercise and work on diet. ?

## 2021-04-24 NOTE — Progress Notes (Signed)
? ? ?Cardiology Office Note ? ?Date:  04/24/2021  ? ?ID:  Phillip [name], DOB 1950-08-28, MRN [id number] ? ?PCP:  Yvonna Alanis, NP  ?Cardiologist:   Skeet Latch, MD  ? ?No chief complaint on file. ?  ?History of Present Illness: ?Phillip [name] is a 71 y.o. male with CAD s/p CABG (LIMA-->LAD, SVG to OM, RI, PDA), moderate aortic stenosis, hypertension, hyperlipidemia, mild ascending aortic aneurysm, and diabetes who presents for follow-up. He was initially seen 04/2019 for a second opinion on coronary artery disease.   He initially had symptoms that he thought were attributable to hernia.  However his primary care provider was concerned that it may be ischemia.  He had an abnormal stress echo at [facility] on 05/25/19. He achieved 7 METS and had chest pain with exercise. There was 1 mm ST depression inferolaterally. He developed hypokinesis in the LAD region and LV dilatation with stress. He was referred to [facility] where high-sensitivity troponin was mildly elevated and flat (32, 36, 24). LDL was 171 and hemoglobin A1c was 7.8%. He underwent LHC, where he was found to have 3 vessel obstructive CAD, including proximal LAD occlusion with left to left collaterals. He had occlusive disease in OM 2, OM1, mid to distal RCA, and PDA. He also had right to left collaterals. Echo revealed LVEF 50% with mild global hypokinesis and normal right ventricular function. There is aortic valve thickening with restricted movement and mild to moderate aortic stenosis.  Dimensionless index 0.58 cm?. Mean gradient was not listed in the report.  The ascending aorta was mildly dilated at 4.1 cm.   ? ?[name] was referred to Dr. Prescott Gum and underwent CABG 06/2019. He had an episode of syncope in the hospital postoperatively. Otherwise, his postoperative course was unremarkable. He had a repeat Echo 09/2020 that revealed LVEF 55% and grade 2 diastolic dysfunction. He had moderate aortic stenosis with mean gradient 21 mmHg. At his  last appointment, he was doing well but his blood pressure was poorly controlled. His carvedilol was increased to 12.5 mg twice daily and he was continuing to work on diet and exercise. He saw Tommy Medal 10/2020 and his blood pressure remained uncontrolled. He was not interested in increasing his medication.  ? ?At his last appointment he was generally doing well aside from some weight gain. His blood pressure was above goal but he wanted to work on diet and exercise before adding medicine. Today, he is feeling good overall. He presents a BP log which shows better controlled blood pressures at home (146/92 in clinic today). He has been staying active with maintenance on his home. Also he has a goal of walking 7.5 k steps a day. Since his last visit he has lost 8 lbs. He denies any palpitations, chest pain, shortness of breath, or peripheral edema. No lightheadedness, headaches, syncope, orthopnea, or PND. ? ? ?Past Medical History:  ?Diagnosis Date  ? Aortic stenosis   ? mild to moderate AS 05/2019 echo   ? CAD in native artery 06/09/2019  ? Diabetes mellitus type 2 in obese ([place]) 06/09/2019  ? Elevated hemoglobin A1c   ? Essential hypertension 06/09/2019  ? History of colonoscopy 01/08/2009  ? History of high cholesterol   ? Melanoma ([place])   ? back  ? Pure hypercholesterolemia 06/09/2019  ? ? ?Past Surgical History:  ?Procedure Laterality Date  ? CARDIAC SURGERY  06/30/2019  ? CATARACT EXTRACTION W/PHACO Left 09/01/2019  ? Procedure: CATARACT EXTRACTION PHACO AND INTRAOCULAR  LENS PLACEMENT (IOC) LEFT 6.30  00:36.6;  Surgeon: Birder Robson, MD;  Location: [place];  Service: Ophthalmology;  Laterality: Left;  ? CATARACT EXTRACTION W/PHACO Right 09/29/2019  ? Procedure: CATARACT EXTRACTION PHACO AND INTRAOCULAR LENS PLACEMENT (IOC) RIGHT DIABETIC 5.06 00:35.6;  Surgeon: Birder Robson, MD;  Location: [place];  Service: Ophthalmology;  Laterality: Right;  Diabetic - oral meds  ? COLONOSCOPY   2003  ? COLONOSCOPY  01/08/2001  ? CORONARY ARTERY BYPASS GRAFT N/A 06/30/2019  ? Procedure: CORONARY ARTERY BYPASS GRAFTING (CABG) x 4, LIMA TO DIAGONAL 1, SVG TO OML, SVG TO RAMUS, SVG TO PDA;  Surgeon: Prescott Gum, Collier Salina, MD;  Location: [place];  Service: Open Heart Surgery;  Laterality: N/A;  ? ENDOVEIN HARVEST OF GREATER SAPHENOUS VEIN Bilateral 06/30/2019  ? Procedure: ENDOVEIN HARVEST OF GREATER SAPHENOUS VEIN;  Surgeon: Ivin Poot, MD;  Location: [place];  Service: Open Heart Surgery;  Laterality: Bilateral;  ? MELANOMA EXCISION  01/09/2004  ? back  ? TEE WITHOUT CARDIOVERSION N/A 06/30/2019  ? Procedure: TRANSESOPHAGEAL ECHOCARDIOGRAM (TEE);  Surgeon: Prescott Gum, Collier Salina, MD;  Location: [place];  Service: Open Heart Surgery;  Laterality: N/A;  ? THYROGLOSSAL DUCT CYST  01/09/1999  ? ? ? ?Current Outpatient Medications  ?Medication Sig Dispense Refill  ? aspirin EC 81 MG tablet Take 81 mg by mouth daily. Swallow whole.    ? carvedilol (COREG) 12.5 MG tablet Take 1 tablet (12.5 mg total) by mouth 2 (two) times daily with a meal. 180 tablet 3  ? metFORMIN (GLUCOPHAGE) 500 MG tablet Take 1 tablet (500 mg total) by mouth 2 (two) times daily with a meal. 180 tablet 2  ? Multiple Vitamins-Minerals (CENTRUM SILVER PO) Take by mouth daily.    ? rosuvastatin (CRESTOR) 40 MG tablet Take 1 tablet (40 mg total) by mouth at bedtime. 90 tablet 2  ? ?No current facility-administered medications for this visit.  ? ? ?Allergies:   Patient has no known allergies.  ? ? ?Social History:  The patient  reports that he quit smoking about 44 years ago. His smoking use included cigarettes. He has a 30.00 pack-year smoking history. He has never used smokeless tobacco. He reports current alcohol use of about 6.0 - 8.0 standard drinks per week.  ? ?Family History:  The patient's family history includes CAD in his father; Colon cancer in his father; Diabetes in his mother; Heart disease in his maternal grandfather, maternal uncle, and mother;  Kidney failure in his mother; Valvular heart disease in his father.  ? ? ?ROS:   ?Please see the history of present illness. ?All other systems are reviewed and negative.  ? ? ?PHYSICAL EXAM: ?VS:  BP 128/82 (BP Location: Left Arm, Patient Position: Sitting, Cuff Size: Normal)   Pulse 77   Ht '5\' 7"'$  (1.702 m)   Wt 219 lb 12.8 oz (99.7 kg)   SpO2 95%   BMI 34.43 kg/m?  , BMI Body mass index is 34.43 kg/m?. ?GENERAL:  Well appearing ?HEENT:  Pupils equal round and reactive, fundi not visualized, oral mucosa unremarkable ?NECK:  No jugular venous distention, waveform within normal limits, carotid upstroke brisk and symmetric, no bruits, no thyromegaly ?LYMPHATICS:  No cervical adenopathy ?LUNGS:  Clear to auscultation bilaterally ?HEART:  RRR.  PMI not displaced or sustained,S1 and S2 within normal limits, no S3, no S4, no clicks, no rubs, II/VI mid-peaking systolic murmur at the LUSB ?ABD:  Flat, positive bowel sounds normal in frequency in  pitch, no bruits, no rebound, no guarding, no midline pulsatile mass, no hepatomegaly, no splenomegaly ?EXT:  2 plus pulses throughout, no edema, no cyanosis no clubbing ?SKIN:  No rashes no nodules ?NEURO:  Cranial nerves II through XII grossly intact, motor grossly intact throughout ?PSYCH:  Cognitively intact, oriented to person place and time ? ?EKG:  EKG is personally reviewed. ?04/24/2021: EKG was not ordered. ?09/21/20: Sinus rhythm. Rate 72 bpm. Prior anteroseptal infarct. ?06/09/19: sinus rhythm.  Rate 82 bpm.  LAD.   ? ?Echo 09/19/2020: ? 1. Left ventricular ejection fraction, by estimation, is 55%. The left  ?ventricle has normal function. The left ventricle has no regional wall  ?motion abnormalities. Left ventricular diastolic parameters are consistent  ?with Grade II diastolic dysfunction  ?(pseudonormalization).  ? 2. Right ventricular systolic function is mildly reduced. The right  ?ventricular size is normal. There is normal pulmonary artery systolic  ?pressure. The  estimated right ventricular systolic pressure is 29.0 mmHg.  ? 3. Left atrial size was mildly dilated.  ? 4. The mitral valve is normal in structure. Trivial mitral valve  ?regurgitation. No evidence of mit

## 2021-04-24 NOTE — Assessment & Plan Note (Signed)
He brought a log of his blood pressure showing that they have been much better controlled at home.  Averaging in the 120s over 80s.  He has successfully lost about 8 to 10 pounds and continues to lose weight.  He is not interested in increasing his medicine and wants to keep working on diet and exercise.  Continue carvedilol. ?

## 2021-04-24 NOTE — Assessment & Plan Note (Signed)
He is doing well from a cardiovascular standpoint.  Blood pressure is improving.  He is increasing his exercise and has no angina.  Continue aspirin, carvedilol, and rosuvastatin. ?

## 2021-04-24 NOTE — Patient Instructions (Signed)
Medication Instructions:  ?Your physician recommends that you continue on your current medications as directed. Please refer to the Current Medication list given to you today.  ? ?*If you need a refill on your cardiac medications before your next appointment, please call your pharmacy* ? ?Lab Work: ?NONE ? ?Testing/Procedures: ?NONE ? ?Follow-Up: ?At [facility], you and your health needs are our priority.  As part of our continuing mission to provide you with exceptional heart care, we have created designated Provider Care Teams.  These Care Teams include your primary Cardiologist (physician) and Advanced Practice Providers (APPs -  Physician Assistants and Nurse Practitioners) who all work together to provide you with the care you need, when you need it. ? ?We recommend signing up for the patient portal called "MyChart".  Sign up information is provided on this After Visit Summary.  MyChart is used to connect with patients for Virtual Visits (Telemedicine).  Patients are able to view lab/test results, encounter notes, upcoming appointments, etc.  Non-urgent messages can be sent to your provider as well.   ?To learn more about what you can do with MyChart, go to [email or web address].   ? ?Your next appointment:   ?12 month(s) ? ?The format for your next appointment:   ?In Person ? ?Provider:   ?Skeet Latch, MD  ? ?Important Information About Sugar ? ? ? ? ? ?

## 2021-07-27 IMAGING — CR DG CHEST 1V PORT
1 series · 1 of 1 positions shown · non-contrast
Comparison: Chest radiograph dated 06/26/2019

CLINICAL DATA: 69-year-old male status post CABG. Rule out
pneumothorax.

EXAM:
PORTABLE CHEST 1 VIEW

[AP]
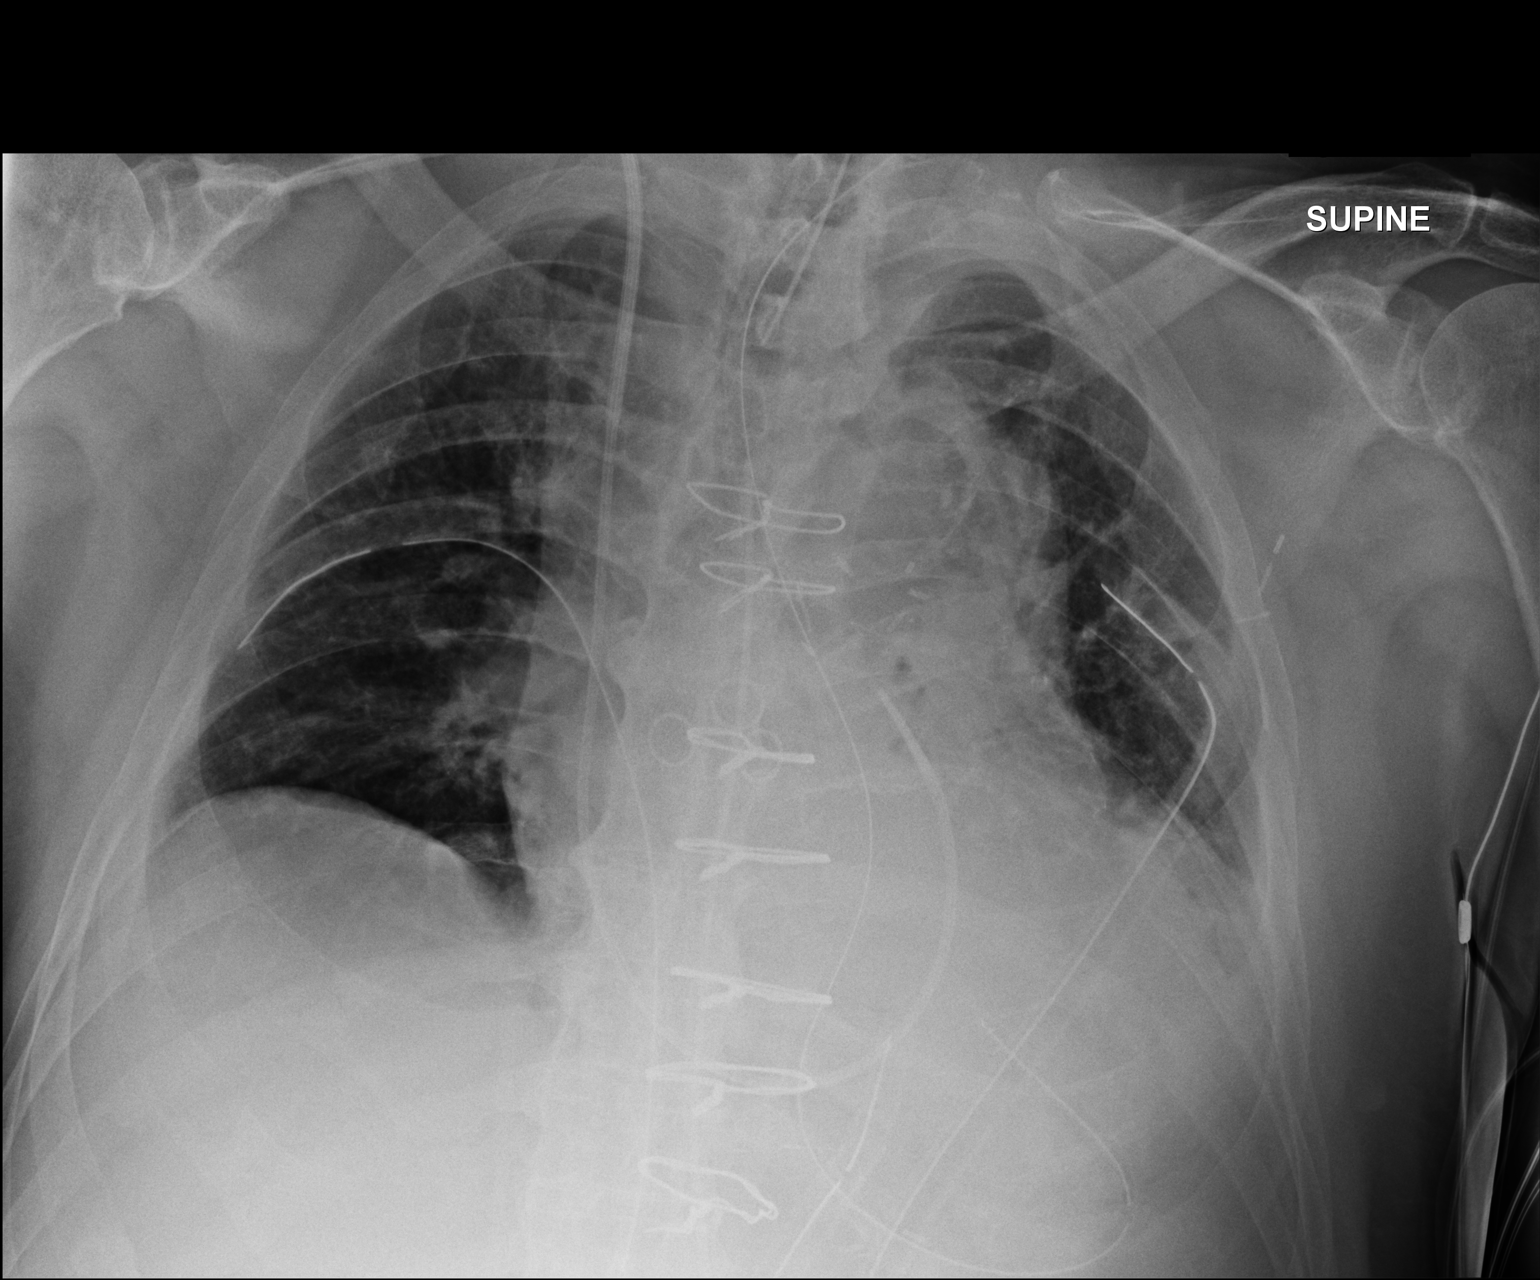

[1 of 1 positions shown; findings below may reference images not displayed]

FINDINGS: Endotracheal tube approximately 6 cm above the carina and enteric
tube extends below the diaphragm with tip in the proximal stomach.
Right IJ Swan-Ganz catheter with tip likely at the bifurcation of
the main pulmonary trunk. Bilateral chest tubes noted. Left lung
base density may represent atelectasis. Trace left pleural effusion
may be present. No pneumothorax. Mildly enlarged cardiomediastinal
silhouette. Atherosclerotic calcification of the aorta. Median
sternotomy wires.
IMPRESSION: 1. Status post CABG. No pneumothorax.
2. Left lung base atelectasis.
3. Support lines and tubes as described.

## 2021-07-29 IMAGING — DX DG CHEST 1V PORT
1 series · 1 of 1 positions shown · non-contrast
Comparison: Portable exam 9743 hours compared to 07/01/2019

CLINICAL DATA: Post open heart surgery, chest tubes, chest soreness

EXAM:
PORTABLE CHEST 1 VIEW

[chest]
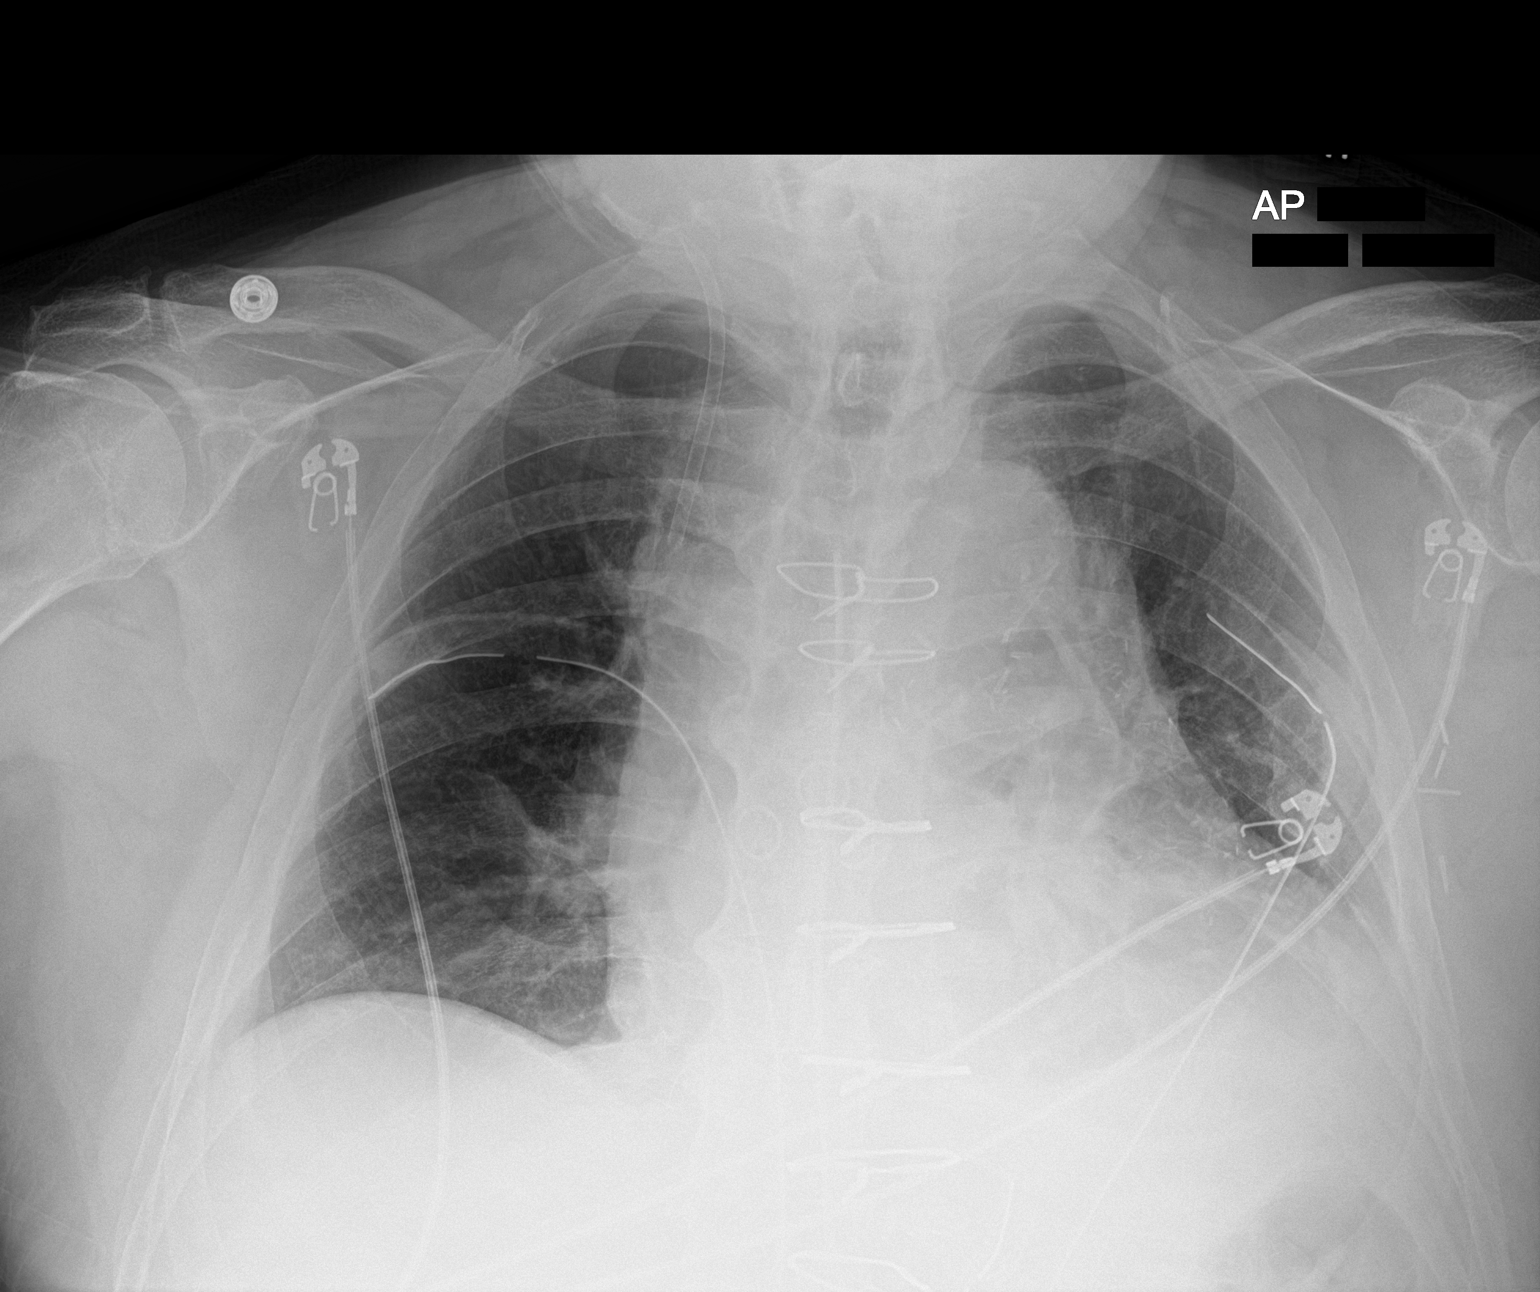

[1 of 1 positions shown; findings below may reference images not displayed]

FINDINGS: Interval removal of Swan-Ganz catheter.

Mediastinal drain and BILATERAL thoracostomy tubes remain.

RIGHT jugular line tip projecting over SVC.

Enlargement of cardiac silhouette post CABG.

Persistent LEFT basilar atelectasis.

Lungs otherwise clear.

No pleural effusion or pneumothorax.
IMPRESSION: Persistent LEFT basilar atelectasis.

## 2021-07-31 IMAGING — DX DG CHEST 2V
2 series · 2 of 2 positions shown · non-contrast
Comparison: 07/03/2019

CLINICAL DATA: CABG.

EXAM:
CHEST - 2 VIEW

[chest pa]
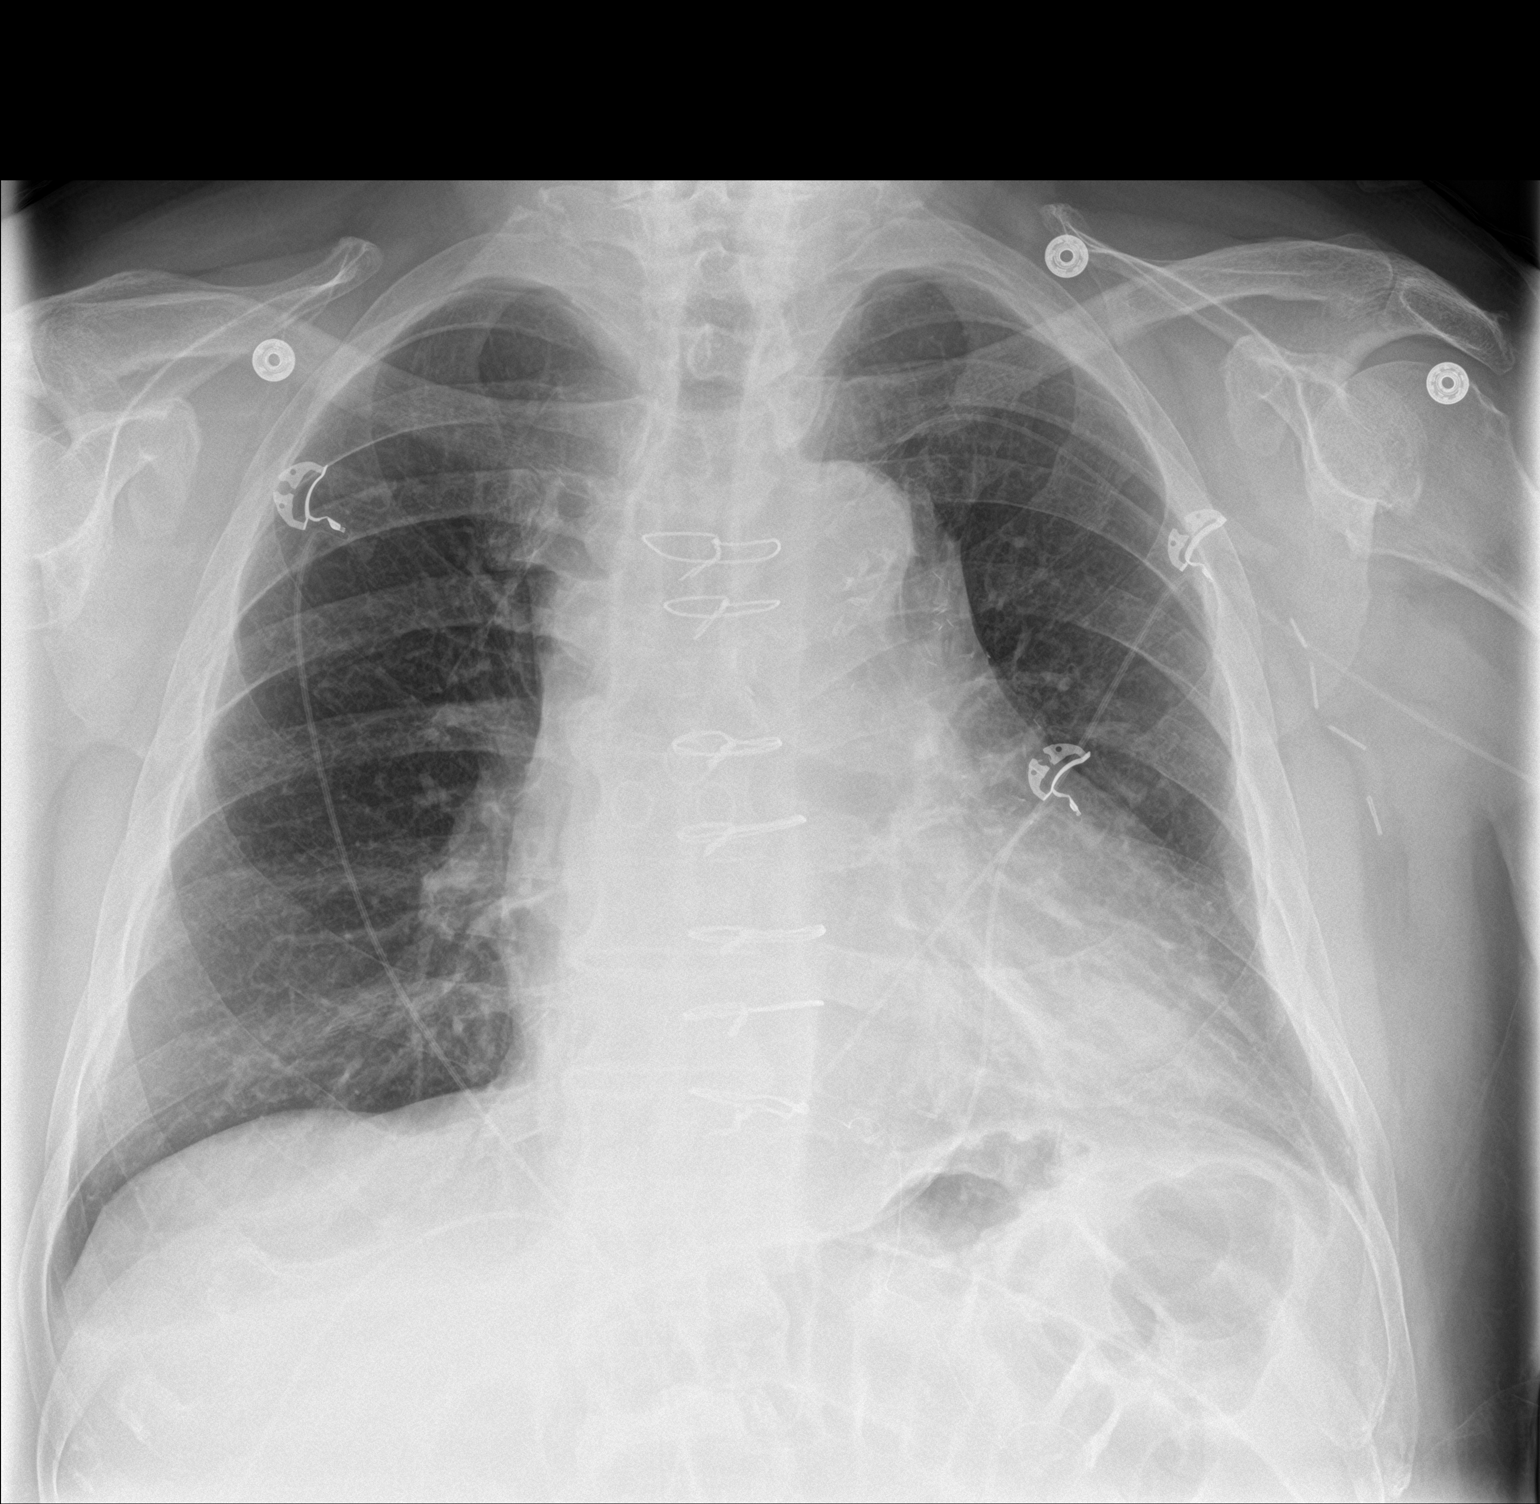

[chest lat]
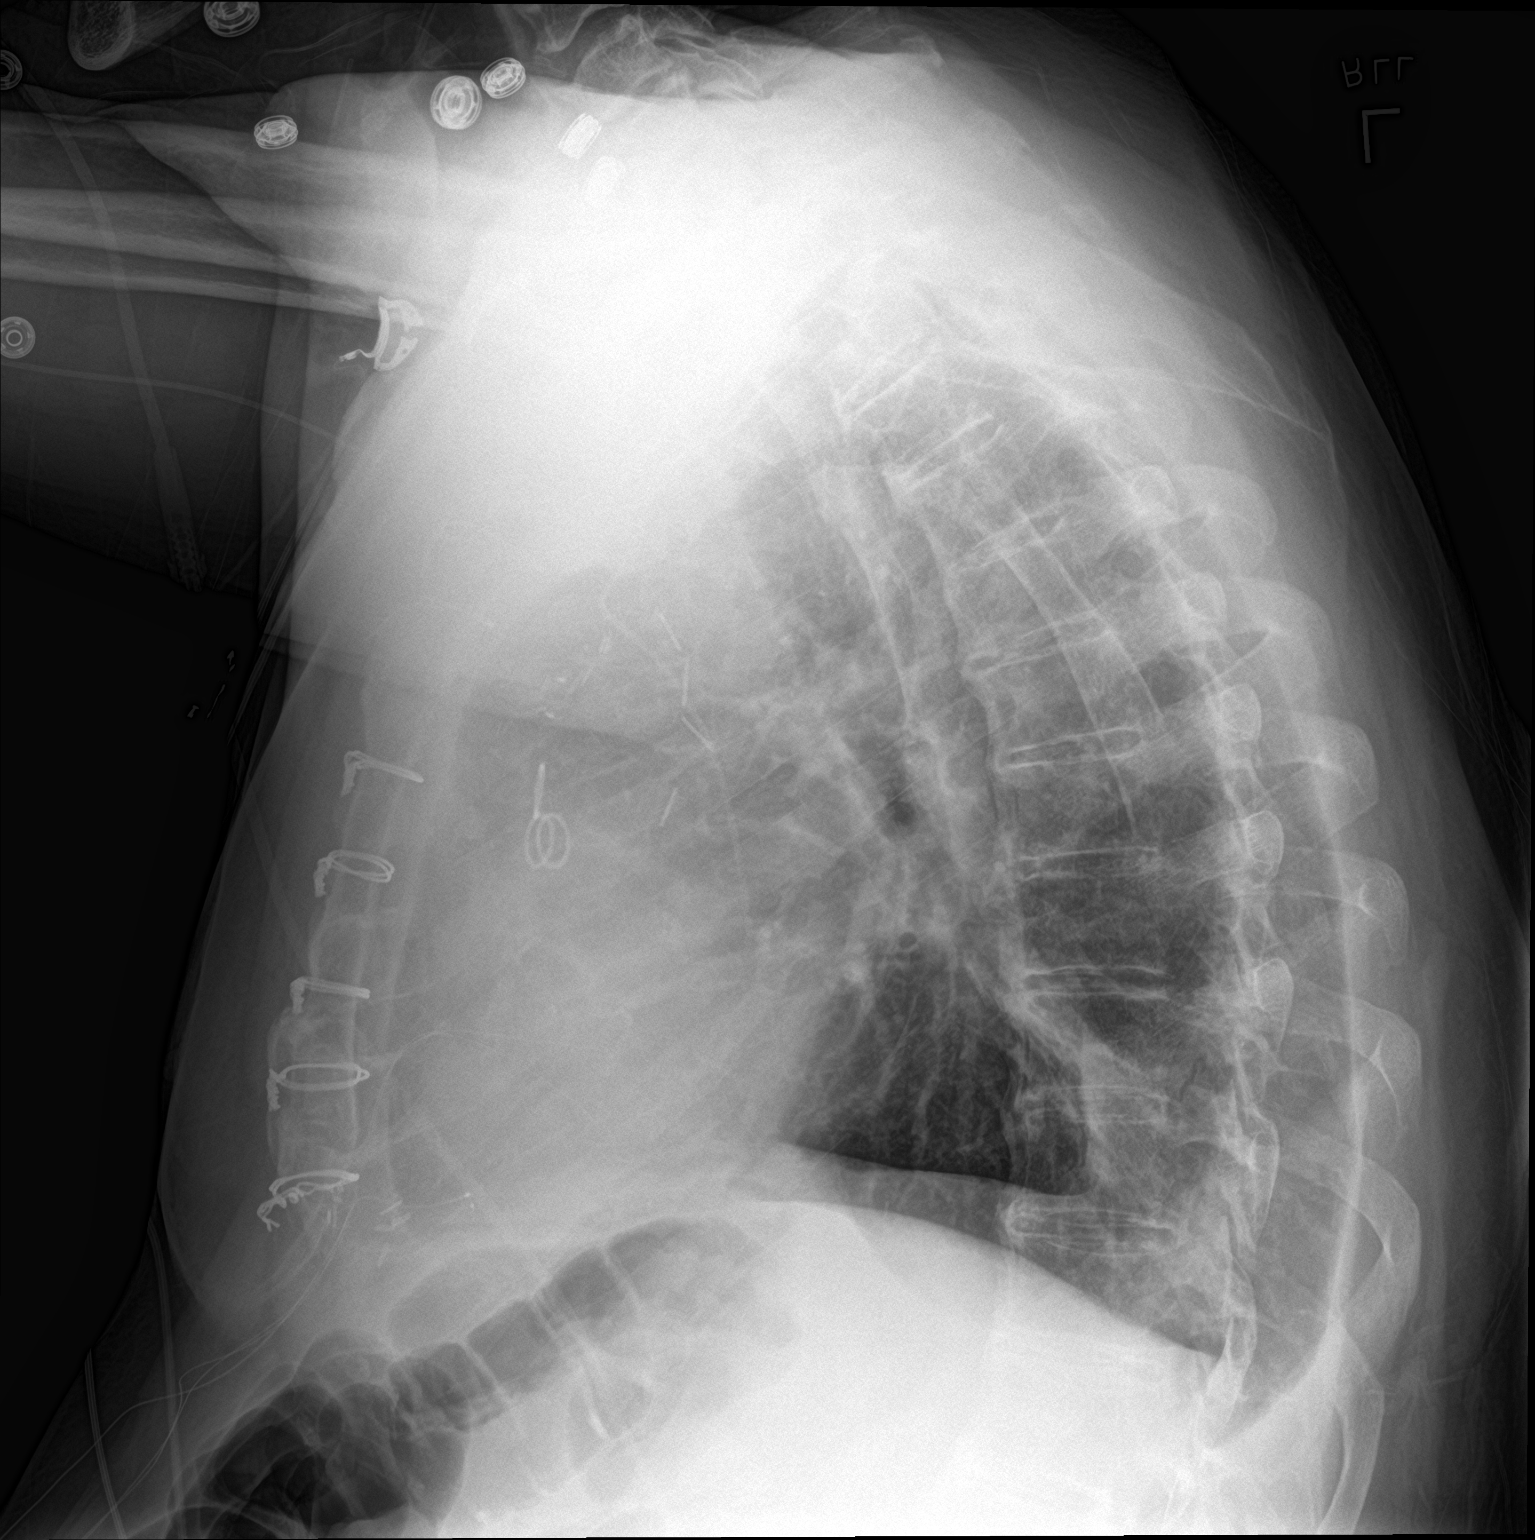

[2 of 2 positions shown; findings below may reference images not displayed]

FINDINGS: Left-sided PICC line has tip over the SVC. Lungs are adequately
inflated with minimal left base/retrocardiac opacification likely
atelectasis which is slightly improved. No definite effusion. No
pneumothorax. Mild stable cardiomegaly. Remainder of the exam is
unchanged.
IMPRESSION: 1. Slight improvement left base/retrocardiac opacification likely
improving atelectasis. Mild stable cardiomegaly.

2.  Left-sided PICC line with tip over the SVC.

## 2021-09-01 IMAGING — DX DG CHEST 2V
2 series · 2 of 2 positions shown · non-contrast
Comparison: 07/06/2019

CLINICAL DATA: Coronary artery disease post CABG

EXAM:
CHEST - 2 VIEW

[dg chest 2 view (1 of 2)]
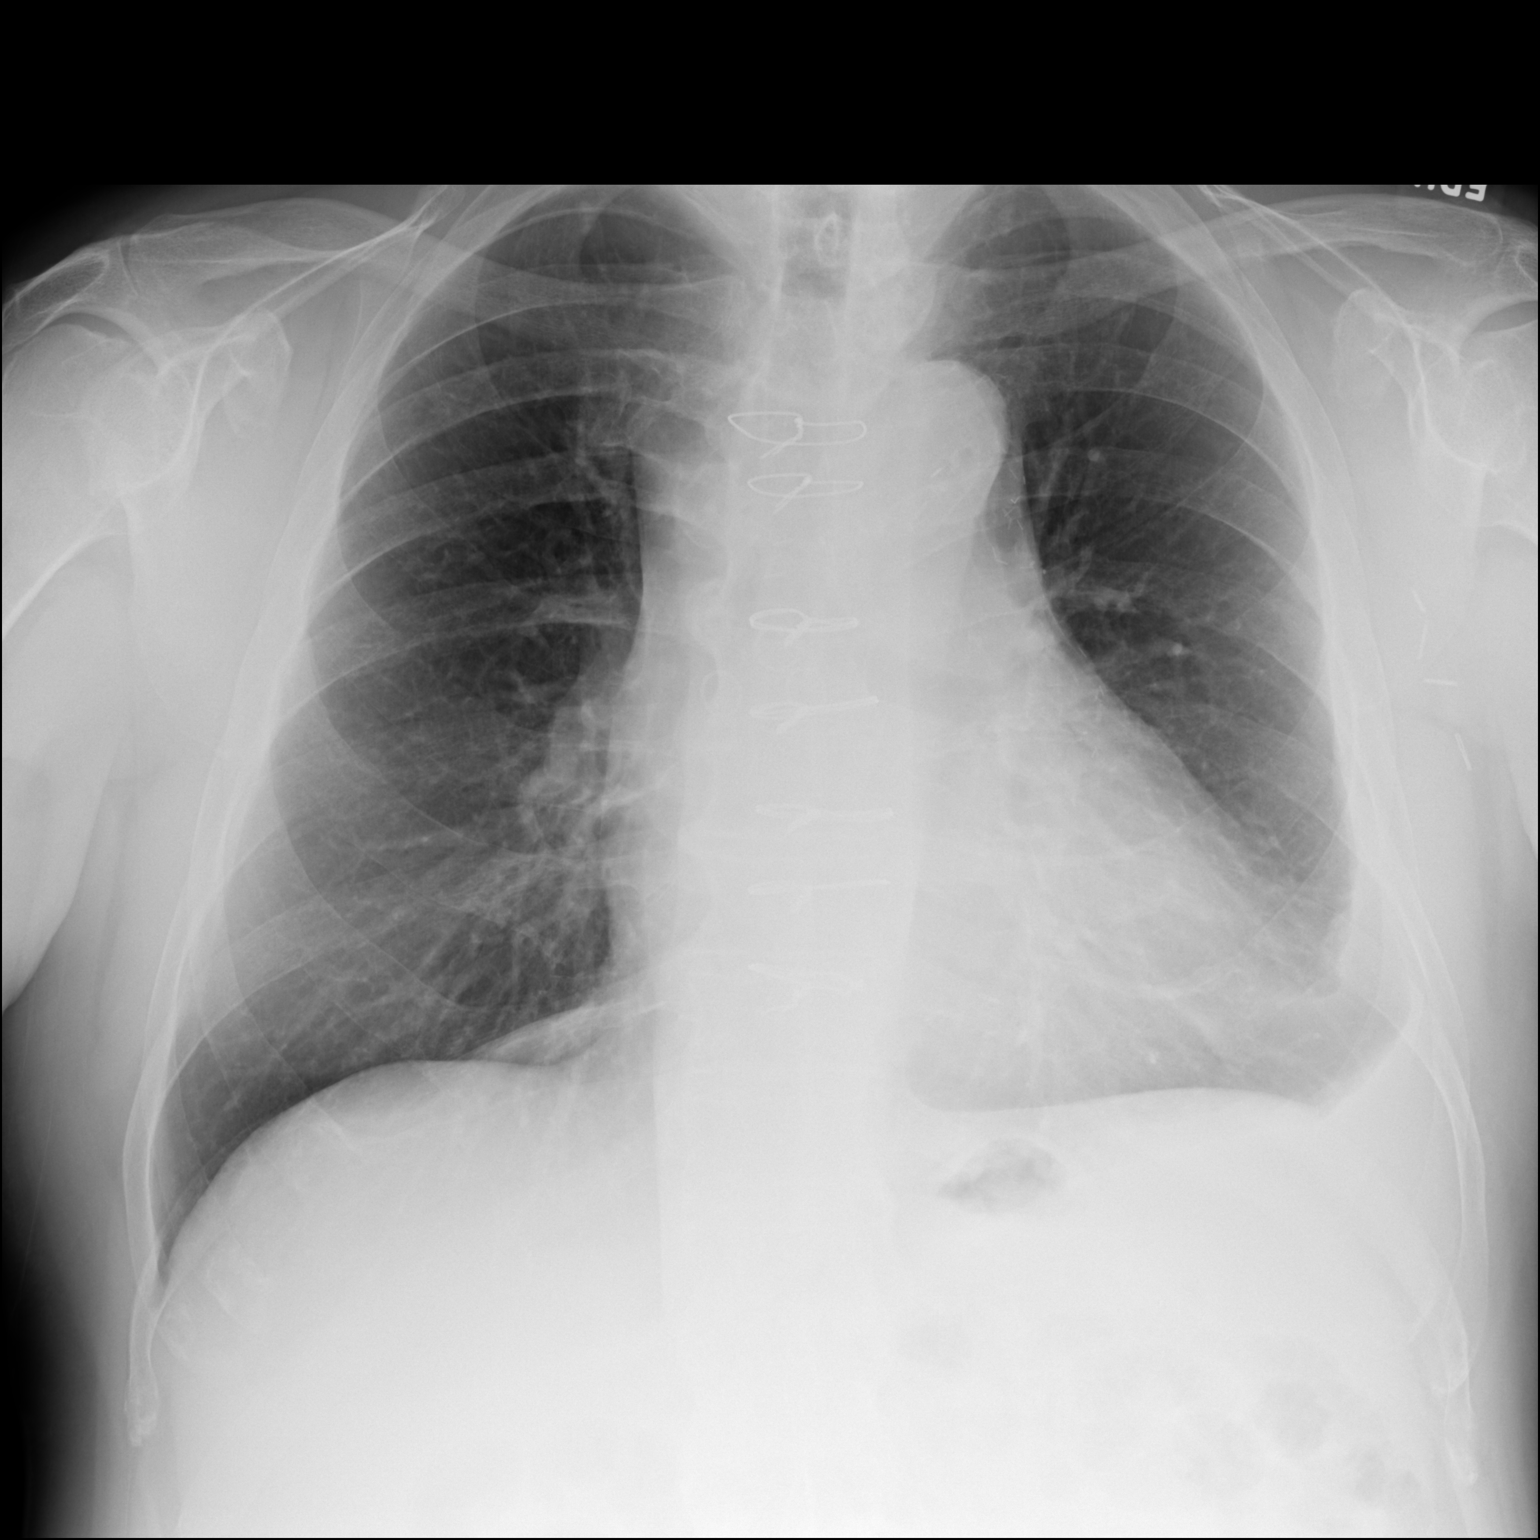

[dg chest 2 view (2 of 2)]
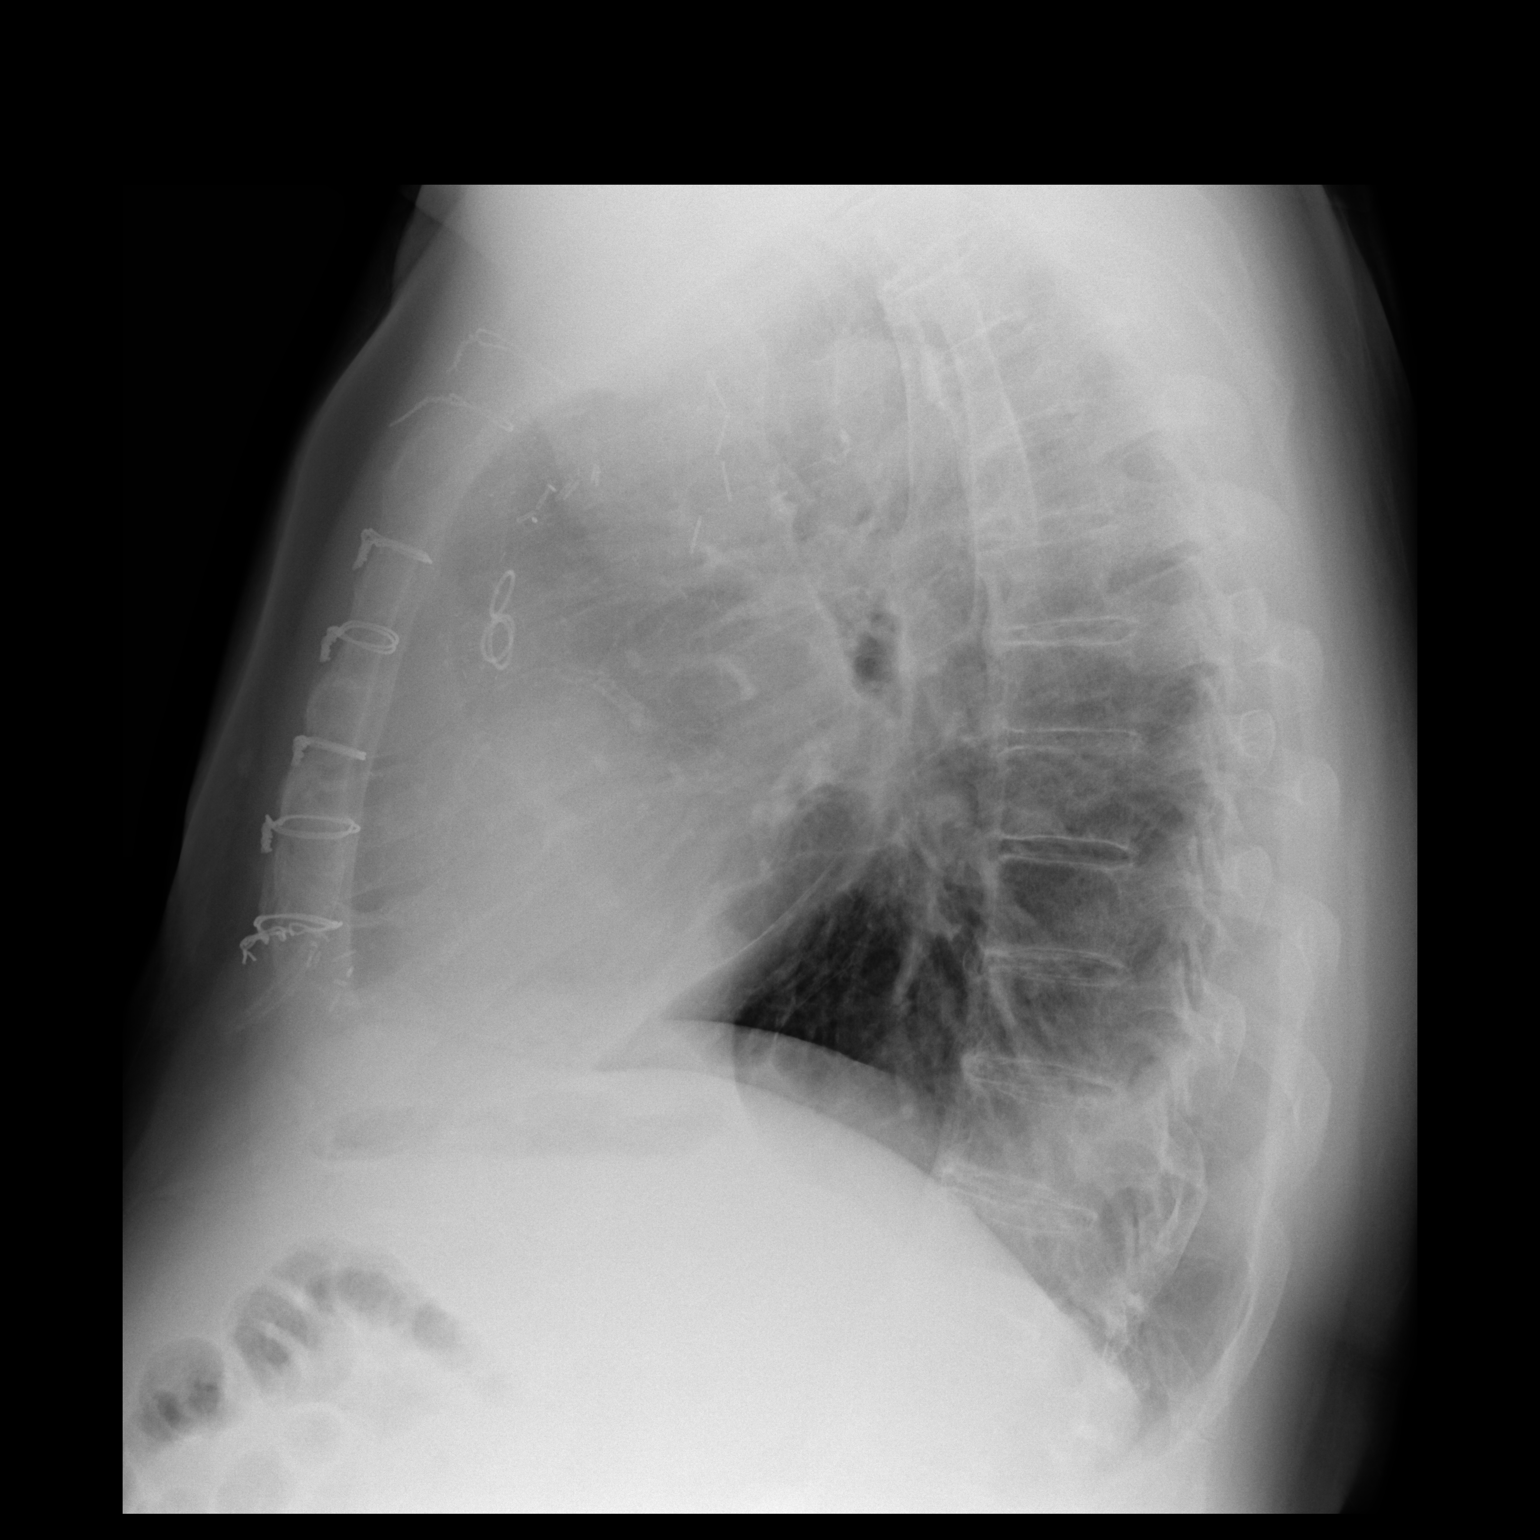

[2 of 2 positions shown; findings below may reference images not displayed]

FINDINGS: Borderline enlargement of cardiac silhouette post CABG.

Mediastinal contours and pulmonary vascularity normal.

Small LEFT pleural effusion.

Lungs otherwise clear.

No acute infiltrate, RIGHT pleural effusion or pneumothorax.

Atherosclerotic calcification aorta.

Osseous structures unremarkable.
IMPRESSION: Post CABG.

Small LEFT pleural effusion.

## 2021-09-20 ENCOUNTER — Other Ambulatory Visit (HOSPITAL_BASED_OUTPATIENT_CLINIC_OR_DEPARTMENT_OTHER): Payer: Self-pay | Admitting: *Deleted

## 2021-09-20 MED ORDER — METFORMIN HCL 500 MG PO TABS: 500.0000 mg | ORAL_TABLET | Freq: Two times a day (BID) | ORAL | 1 refills | Status: DC

## 2021-09-20 NOTE — Telephone Encounter (Signed)
Rx(s) sent to pharmacy electronically.  

## 2021-10-03 ENCOUNTER — Other Ambulatory Visit (HOSPITAL_BASED_OUTPATIENT_CLINIC_OR_DEPARTMENT_OTHER): Payer: Self-pay | Admitting: *Deleted

## 2021-10-03 MED ORDER — ROSUVASTATIN CALCIUM 40 MG PO TABS: 40.0000 mg | ORAL_TABLET | Freq: Every day | ORAL | 1 refills | Status: DC

## 2021-10-03 NOTE — Telephone Encounter (Signed)
Rx(s) sent to pharmacy electronically.  

## 2021-11-07 ENCOUNTER — Other Ambulatory Visit (HOSPITAL_BASED_OUTPATIENT_CLINIC_OR_DEPARTMENT_OTHER): Payer: Self-pay | Admitting: *Deleted

## 2021-11-07 MED ORDER — CARVEDILOL 12.5 MG PO TABS: 12.5000 mg | ORAL_TABLET | Freq: Two times a day (BID) | ORAL | 3 refills | Status: DC

## 2021-11-09 DIAGNOSIS — Z85820 Personal history of malignant melanoma of skin: Secondary | ICD-10-CM | POA: Diagnosis not present

## 2021-11-09 DIAGNOSIS — L219 Seborrheic dermatitis, unspecified: Secondary | ICD-10-CM | POA: Diagnosis not present

## 2021-11-09 DIAGNOSIS — L821 Other seborrheic keratosis: Secondary | ICD-10-CM | POA: Diagnosis not present

## 2021-11-09 DIAGNOSIS — H01139 Eczematous dermatitis of unspecified eye, unspecified eyelid: Secondary | ICD-10-CM | POA: Diagnosis not present

## 2021-11-09 DIAGNOSIS — L578 Other skin changes due to chronic exposure to nonionizing radiation: Secondary | ICD-10-CM | POA: Diagnosis not present

## 2022-03-20 ENCOUNTER — Other Ambulatory Visit: Payer: Self-pay | Admitting: Cardiovascular Disease

## 2022-03-20 MED ORDER — METFORMIN HCL 500 MG PO TABS: 500.0000 mg | ORAL_TABLET | Freq: Two times a day (BID) | ORAL | 3 refills | Status: DC

## 2022-03-21 DIAGNOSIS — Z129 Encounter for screening for malignant neoplasm, site unspecified: Secondary | ICD-10-CM | POA: Diagnosis not present

## 2022-03-21 DIAGNOSIS — Z85820 Personal history of malignant melanoma of skin: Secondary | ICD-10-CM | POA: Diagnosis not present

## 2022-03-21 DIAGNOSIS — L570 Actinic keratosis: Secondary | ICD-10-CM | POA: Diagnosis not present

## 2022-03-21 DIAGNOSIS — L820 Inflamed seborrheic keratosis: Secondary | ICD-10-CM | POA: Diagnosis not present

## 2022-03-21 DIAGNOSIS — D225 Melanocytic nevi of trunk: Secondary | ICD-10-CM | POA: Diagnosis not present

## 2022-03-21 DIAGNOSIS — L2389 Allergic contact dermatitis due to other agents: Secondary | ICD-10-CM | POA: Diagnosis not present

## 2022-03-21 DIAGNOSIS — L218 Other seborrheic dermatitis: Secondary | ICD-10-CM | POA: Diagnosis not present

## 2022-04-02 ENCOUNTER — Other Ambulatory Visit: Payer: Self-pay | Admitting: *Deleted

## 2022-04-02 ENCOUNTER — Encounter (HOSPITAL_BASED_OUTPATIENT_CLINIC_OR_DEPARTMENT_OTHER): Payer: Self-pay | Admitting: Cardiovascular Disease

## 2022-04-02 MED ORDER — ROSUVASTATIN CALCIUM 40 MG PO TABS: 40.0000 mg | ORAL_TABLET | Freq: Every day | ORAL | 0 refills | Status: DC

## 2022-04-02 MED ORDER — ROSUVASTATIN CALCIUM 40 MG PO TABS: 40.0000 mg | ORAL_TABLET | Freq: Every day | ORAL | 3 refills | Status: DC

## 2022-05-22 DIAGNOSIS — Z961 Presence of intraocular lens: Secondary | ICD-10-CM | POA: Diagnosis not present

## 2022-06-25 ENCOUNTER — Encounter (HOSPITAL_BASED_OUTPATIENT_CLINIC_OR_DEPARTMENT_OTHER): Payer: Self-pay | Admitting: Cardiovascular Disease

## 2022-06-25 DIAGNOSIS — I10 Essential (primary) hypertension: Secondary | ICD-10-CM

## 2022-06-25 DIAGNOSIS — Z5181 Encounter for therapeutic drug level monitoring: Secondary | ICD-10-CM

## 2022-06-25 DIAGNOSIS — E7800 Pure hypercholesterolemia, unspecified: Secondary | ICD-10-CM

## 2022-06-25 MED ORDER — ROSUVASTATIN CALCIUM 40 MG PO TABS: 40.0000 mg | ORAL_TABLET | Freq: Every day | ORAL | 0 refills | Status: DC

## 2022-07-31 ENCOUNTER — Encounter (HOSPITAL_BASED_OUTPATIENT_CLINIC_OR_DEPARTMENT_OTHER): Payer: Self-pay | Admitting: Cardiovascular Disease

## 2022-08-20 ENCOUNTER — Ambulatory Visit (HOSPITAL_BASED_OUTPATIENT_CLINIC_OR_DEPARTMENT_OTHER): Payer: PPO | Admitting: Family

## 2022-08-20 ENCOUNTER — Encounter (HOSPITAL_BASED_OUTPATIENT_CLINIC_OR_DEPARTMENT_OTHER): Payer: Self-pay | Admitting: Family

## 2022-08-20 VITALS — BP 138/83 | HR 67 | Ht 67.0 in | Wt 219.0 lb

## 2022-08-20 DIAGNOSIS — I10 Essential (primary) hypertension: Secondary | ICD-10-CM

## 2022-08-20 DIAGNOSIS — I25118 Atherosclerotic heart disease of native coronary artery with other forms of angina pectoris: Secondary | ICD-10-CM

## 2022-08-20 DIAGNOSIS — E785 Hyperlipidemia, unspecified: Secondary | ICD-10-CM

## 2022-08-20 DIAGNOSIS — I350 Nonrheumatic aortic (valve) stenosis: Secondary | ICD-10-CM

## 2022-08-20 MED ORDER — ROSUVASTATIN CALCIUM 40 MG PO TABS: 40.0000 mg | ORAL_TABLET | Freq: Every day | ORAL | 3 refills | Status: DC

## 2022-08-20 MED ORDER — CARVEDILOL 12.5 MG PO TABS
12.5000 mg | ORAL_TABLET | Freq: Two times a day (BID) | ORAL | 3 refills | Status: DC
Start: 2022-08-20 — End: 2023-07-30

## 2022-08-20 NOTE — Patient Instructions (Signed)
Medication Instructions:   Continue your current medications.   *If you need a refill on your cardiac medications before your next appointment, please call your pharmacy*  Testing/Procedures: Your physician has requested that you have an echocardiogram for monitoring of your aortic stenosis. Echocardiography is a painless test that uses sound waves to create images of your heart. It provides your doctor with information about the size and shape of your heart and how well your heart's chambers and valves are working. This procedure takes approximately one hour. There are no restrictions for this procedure. Please do NOT wear cologne, perfume, aftershave, or lotions (deodorant is allowed). Please arrive 15 minutes prior to your appointment time.    Follow-Up: At [facility], you and your health needs are our priority.  As part of our continuing mission to provide you with exceptional heart care, we have created designated Provider Care Teams.  These Care Teams include your primary Cardiologist (physician) and Advanced Practice Providers (APPs -  Physician Assistants and Nurse Practitioners) who all work together to provide you with the care you need, when you need it.  We recommend signing up for the patient portal called "MyChart".  Sign up information is provided on this After Visit Summary.  MyChart is used to connect with patients for Virtual Visits (Telemedicine).  Patients are able to view lab/test results, encounter notes, upcoming appointments, etc.  Non-urgent messages can be sent to your provider as well.   To learn more about what you can do with MyChart, go to [email or web address].    Your next appointment:   6 month(s)  Provider:   Chilton Si, MD or Alver Sorrow, NP    Other Instructions   Tips to Measure your Blood Pressure Correctly  Here's what you can do to ensure a correct reading:  Don't drink a caffeinated beverage or smoke during the 30 minutes  before the test.  Sit quietly for five minutes before the test begins.  During the measurement, sit in a chair with your feet on the floor and your arm supported so your elbow is at about heart level.  The inflatable part of the cuff should completely cover at least 80% of your upper arm, and the cuff should be placed on bare skin, not over a shirt.  Don't talk during the measurement.  Blood pressure categories  Blood pressure category SYSTOLIC (upper number)  DIASTOLIC (lower number)  Normal Less than 120 mm Hg and Less than 80 mm Hg  Elevated 120-129 mm Hg and Less than 80 mm Hg  High blood pressure: Stage 1 hypertension 130-139 mm Hg or 80-89 mm Hg  High blood pressure: Stage 2 hypertension 140 mm Hg or higher or 90 mm Hg or higher  Hypertensive crisis (consult your doctor immediately) Higher than 180 mm Hg and/or Higher than 120 mm Hg  Source: American Heart Association and American Stroke Association. For more on getting your blood pressure under control, buy Controlling Your Blood Pressure, a Special Health Report from [facility].   Blood Pressure Log   Date   Time  Blood Pressure  Example: Nov 1 9 AM 124/78

## 2022-08-20 NOTE — Progress Notes (Signed)
Cardiology Office Note:  .   Date:  08/20/2022  ID:  Phillip [name], DOB [birth year], MRN [id number] PCP: Octavia Heir, NP  [facility] Providers Cardiologist:  Chilton Si, MD    History of Present Illness: Marland Kitchen   Phillip [name] is a 72 y.o. male with hx of CAD s/p CABG (LIMA-LAD, SVG-OM, RI, PDA), moderate aortic stenosis, HTN, HLD, mild ascending aortic aneurysm.  Initial evaluation 04/2019 for second opinion of coronary artery disease. Abnormal stress echo at [facility] 05/25/19. Workup at [facility] with LHC revealed 3 vessel obstructive CAD. Echo with LVEF 50%, mild global hypkinesis, normal RV function, mild to moderate AS. He was referred to Dr. Maren Beach an underwent CABG 06/2019. Repeat echo 09/2020 LVEF 55%, gr2DD, moderate AS with mean gradient .   Presents today for follow up independently. Playing tennis 3 times per week for exercise. BP at home 138/83. Bp will be higher in the morning as high as 150 then by 11pm down to 130. Eating at home and enjoys coooking. Reports no shortness of breath nor dyspnea on exertion. Reports no chest pain, pressure, or tightness. No edema, orthopnea, PND. Reports no palpitations.    ROS: Please see the history of present illness.    All other systems reviewed and are negative.   Studies Reviewed: Marland Kitchen   EKG Interpretation Date/Time:  Monday August 20 2022 13:34:09 EDT Ventricular Rate:  67 PR Interval:  154 QRS Duration:  96 QT Interval:  408 QTC Calculation: 431 R Axis:   -4  Text Interpretation: Normal sinus rhythm  No acute ST/T wave changes. Confirmed by Gillian Shields ([id number]) on 08/20/2022 1:39:05 PM    Cardiac Studies & Procedures       ECHOCARDIOGRAM  ECHOCARDIOGRAM COMPLETE 09/19/2020  Narrative ECHOCARDIOGRAM REPORT    Patient Name:   Phillip [name] Date of Exam: 09/19/2020 Medical Rec #:  [id number]      Height:       67.0 in Accession #:    [id number]     Weight:       221.1 lb Date of Birth:  08/23/1950       BSA:          2.110 m Patient Age:    70 years       BP:           178/106 mmHg Patient Gender: M              HR:           76 bpm. Exam Location:  [address]  Procedure: 2D Echo, 3D Echo, Cardiac Doppler, Color Doppler and Strain Analysis  Indications:    I35. Aortic Stenosis  History:        Patient has prior history of Echocardiogram examinations, most recent 07/06/2019. CAD; Risk Factors:Hypertension and HLD.  Sonographer:    Clearence Ped RCS Referring Phys: [id number] TIFFANY Cushing  IMPRESSIONS   1. Left ventricular ejection fraction, by estimation, is 55%. The left ventricle has normal function. The left ventricle has no regional wall motion abnormalities. Left ventricular diastolic parameters are consistent with Grade II diastolic dysfunction (pseudonormalization). 2. Right ventricular systolic function is mildly reduced. The right ventricular size is normal. There is normal pulmonary artery systolic pressure. The estimated right ventricular systolic pressure is 14.7 mmHg. 3. Left atrial size was mildly dilated. 4. The mitral valve is normal in structure. Trivial mitral valve regurgitation. No evidence of mitral stenosis. 5. The aortic valve is tricuspid. Aortic valve  regurgitation is trivial. Moderate aortic valve stenosis. Aortic valve area, by VTI measures 1.18 cm. Aortic valve mean gradient measures 21.0 mmHg. 6. Aortic dilatation noted. There is mild dilatation of the ascending aorta, measuring 41 mm. 7. The inferior vena cava is normal in size with greater than 50% respiratory variability, suggesting right atrial pressure of 3 mmHg.  FINDINGS Left Ventricle: Left ventricular ejection fraction, by estimation, is 55%. The left ventricle has normal function. The left ventricle has no regional wall motion abnormalities. The left ventricular internal cavity size was normal in size. There is no left ventricular hypertrophy. Left ventricular diastolic parameters are  consistent with Grade II diastolic dysfunction (pseudonormalization).  Right Ventricle: The right ventricular size is normal. No increase in right ventricular wall thickness. Right ventricular systolic function is mildly reduced. There is normal pulmonary artery systolic pressure. The tricuspid regurgitant velocity is 1.71 m/s, and with an assumed right atrial pressure of 3 mmHg, the estimated right ventricular systolic pressure is 14.7 mmHg.  Left Atrium: Left atrial size was mildly dilated.  Right Atrium: Right atrial size was normal in size.  Pericardium: There is no evidence of pericardial effusion.  Mitral Valve: The mitral valve is normal in structure. Mild mitral annular calcification. Trivial mitral valve regurgitation. No evidence of mitral valve stenosis.  Tricuspid Valve: The tricuspid valve is normal in structure. Tricuspid valve regurgitation is trivial.  Aortic Valve: The aortic valve is tricuspid. Aortic valve regurgitation is trivial. Aortic regurgitation PHT measures 482 msec. Moderate aortic stenosis is present. Aortic valve mean gradient measures 21.0 mmHg. Aortic valve peak gradient measures 36.7 mmHg. Aortic valve area, by VTI measures 1.18 cm.  Pulmonic Valve: The pulmonic valve was normal in structure. Pulmonic valve regurgitation is trivial.  Aorta: The aortic root is normal in size and structure and aortic dilatation noted. There is mild dilatation of the ascending aorta, measuring 41 mm.  Venous: The inferior vena cava is normal in size with greater than 50% respiratory variability, suggesting right atrial pressure of 3 mmHg.  IAS/Shunts: No atrial level shunt detected by color flow Doppler.   LEFT VENTRICLE PLAX 2D LVIDd:         5.20 cm  Diastology LVIDs:         3.70 cm  LV e' medial:    6.96 cm/s LV PW:         1.00 cm  LV E/e' medial:  10.7 LV IVS:        1.20 cm  LV e' lateral:   12.30 cm/s LVOT diam:     2.10 cm  LV E/e' lateral: 6.0 LV SV:          74 LV SV Index:   35       2D Longitudinal Strain LVOT Area:     3.46 cm 2D Strain GLS (A2C):   -15.3 % 2D Strain GLS (A3C):   -17.5 % 2D Strain GLS (A4C):   -19.3 % 2D Strain GLS Avg:     -17.3 %  3D Volume EF: 3D EF:        50 % LV EDV:       180 ml LV ESV:       91 ml LV SV:        89 ml  RIGHT VENTRICLE RV Basal diam:  3.20 cm RV S prime:     5.87 cm/s TAPSE (M-mode): 1.6 cm RVSP:           14.7 mmHg  LEFT  ATRIUM             Index       RIGHT ATRIUM           Index LA diam:        4.00 cm 1.90 cm/m  RA Pressure: 3.00 mmHg LA Vol (A2C):   69.1 ml 32.74 ml/m RA Area:     13.30 cm LA Vol (A4C):   50.6 ml 23.98 ml/m RA Volume:   29.00 ml  13.74 ml/m LA Biplane Vol: 61.3 ml 29.05 ml/m AORTIC VALVE AV Area (Vmax):    1.13 cm AV Area (Vmean):   1.10 cm AV Area (VTI):     1.18 cm AV Vmax:           303.00 cm/s AV Vmean:          212.000 cm/s AV VTI:            0.630 m AV Peak Grad:      36.7 mmHg AV Mean Grad:      21.0 mmHg LVOT Vmax:         98.50 cm/s LVOT Vmean:        67.600 cm/s LVOT VTI:          0.214 m LVOT/AV VTI ratio: 0.34 AI PHT:            482 msec  AORTA Ao Root diam: 3.20 cm  MITRAL VALVE               TRICUSPID VALVE MV Area (PHT):             TR Peak grad:   11.7 mmHg MV Decel Time:             TR Vmax:        171.00 cm/s MV E velocity: 74.40 cm/s  Estimated RAP:  3.00 mmHg MV A velocity: 66.60 cm/s  RVSP:           14.7 mmHg MV E/A ratio:  1.12 SHUNTS Systemic VTI:  0.21 m Systemic Diam: 2.10 cm  Dalton McleanMD Electronically signed by Wilfred Lacy Signature Date/Time: 09/19/2020/1:58:01 PM    Final   TEE  ECHO INTRAOPERATIVE TEE 06/30/2019  Interpretation Summary   Left ventricle: Normal cavity size and wall thickness. Wall motion is abnormal.   Left atrium: Left atrial appendage filling and emptying velocities are normal.   Aortic valve: Moderate valve thickening present. Mild valve calcification present. Mild stenosis.  Mild regurgitation with a centrally directed jet.   Mitral valve:  Mild mitral annular calcification. Mild regurgitation. The annulus is mildy.   Right ventricle:  Normal ejection fraction.   Tricuspid valve: Mild regurgitation. The tricuspid valve regurgitation jet is central.            Risk Assessment/Calculations:             Physical Exam:   VS:  BP 138/83 Comment: home BP  Pulse 67   Ht 5\' 7"  (1.702 m)   Wt 219 lb (99.3 kg)   BMI 34.30 kg/m    Wt Readings from Last 3 Encounters:  08/20/22 219 lb (99.3 kg)  04/24/21 219 lb 12.8 oz (99.7 kg)  03/21/21 229 lb (103.9 kg)    GEN: Well nourished, well developed in no acute distress NECK: No JVD; No carotid bruits CARDIAC: RRR, no murmurs, rubs, gallops RESPIRATORY:  Clear to auscultation without rales, wheezing or rhonchi  ABDOMEN: Soft, non-tender, non-distended EXTREMITIES:  No edema; No deformity  ASSESSMENT AND PLAN: .    CAD - Stable with no anginal symptoms. No indication for ischemic evaluation.  GDMT aspirin 81mg  daily, Coreg 12.5mg  BID, Crestor 40mg  daily. Recommend aiming for 150 minutes of moderate intensity activity per week and following a heart healthy diet.    Moderate AS - No chest pain, dyspnea, syncope. Update echocardiogram for monitoring.   HTN - BP elevated in clinic 150/92 with home reading this morning 138/83. Known element of white coat hypertension but BP at home not at goal <130/80. Presently on Coreg 12.5mg  BID. Wishes to make lifestyle changes prior to medication changes. Check in via MyChart in 1 month to assess progress. If BP not at goal, consider increasing Carvedilol vs addition of Amlodipine.   HLD - 08/14/22 total cholesterol 108, triglycerides 191, HDL 52, LDL 26, normal liver enzymes. Continue Rosuvastatin 40mg  daily. Discussed lifestyle changes to lower triglycerides. Consider repeat lipid panel at follow up to reassess.   Prediabetes - 08/14/22 glucose 276. Presently on Metformin 500mg  BID.  Discussed possible addition of Jardiance or [place]. Discussed need to follow up promptly with PCP, he plans to schedule appointment.       Dispo: follow up in 6 months  Signed, Alver Sorrow, NP

## 2022-09-14 ENCOUNTER — Ambulatory Visit (INDEPENDENT_AMBULATORY_CARE_PROVIDER_SITE_OTHER): Payer: PPO

## 2022-09-14 DIAGNOSIS — I350 Nonrheumatic aortic (valve) stenosis: Secondary | ICD-10-CM

## 2022-09-14 LAB — ECHOCARDIOGRAM COMPLETE
AR max vel: 1.3 cm2
AV Area VTI: 1.24 cm2
AV Area mean vel: 1.17 cm2
AV Mean grad: 25.4 mmHg
AV Peak grad: 44.1 mmHg
Ao pk vel: 3.32 m/s
Area-P 1/2: 4.57 cm2
P 1/2 time: 754 ms
S' Lateral: 3.02 cm
Single Plane A4C EF: 62.9 %

## 2022-09-17 ENCOUNTER — Encounter (HOSPITAL_BASED_OUTPATIENT_CLINIC_OR_DEPARTMENT_OTHER): Payer: Self-pay | Admitting: Cardiovascular Disease

## 2022-09-17 ENCOUNTER — Other Ambulatory Visit (HOSPITAL_BASED_OUTPATIENT_CLINIC_OR_DEPARTMENT_OTHER): Payer: Self-pay

## 2022-09-17 DIAGNOSIS — I350 Nonrheumatic aortic (valve) stenosis: Secondary | ICD-10-CM

## 2022-09-17 NOTE — Telephone Encounter (Signed)
You resulted his echo, follow up questions

## 2022-09-20 ENCOUNTER — Encounter (HOSPITAL_BASED_OUTPATIENT_CLINIC_OR_DEPARTMENT_OTHER): Payer: Self-pay

## 2022-10-18 DIAGNOSIS — K633 Ulcer of intestine: Secondary | ICD-10-CM | POA: Diagnosis not present

## 2022-10-18 DIAGNOSIS — D123 Benign neoplasm of transverse colon: Secondary | ICD-10-CM | POA: Diagnosis not present

## 2022-10-18 DIAGNOSIS — Z800 Family history of malignant neoplasm of digestive organs: Secondary | ICD-10-CM | POA: Diagnosis not present

## 2022-10-18 DIAGNOSIS — Z860101 Personal history of adenomatous and serrated colon polyps: Secondary | ICD-10-CM | POA: Diagnosis not present

## 2022-10-18 DIAGNOSIS — D122 Benign neoplasm of ascending colon: Secondary | ICD-10-CM | POA: Diagnosis not present

## 2022-10-18 DIAGNOSIS — D124 Benign neoplasm of descending colon: Secondary | ICD-10-CM | POA: Diagnosis not present

## 2022-10-18 DIAGNOSIS — K5730 Diverticulosis of large intestine without perforation or abscess without bleeding: Secondary | ICD-10-CM | POA: Diagnosis not present

## 2022-10-18 DIAGNOSIS — K5289 Other specified noninfective gastroenteritis and colitis: Secondary | ICD-10-CM | POA: Diagnosis not present

## 2022-10-22 DIAGNOSIS — D122 Benign neoplasm of ascending colon: Secondary | ICD-10-CM | POA: Diagnosis not present

## 2022-10-22 DIAGNOSIS — K5289 Other specified noninfective gastroenteritis and colitis: Secondary | ICD-10-CM | POA: Diagnosis not present

## 2022-10-22 DIAGNOSIS — D124 Benign neoplasm of descending colon: Secondary | ICD-10-CM | POA: Diagnosis not present

## 2022-10-22 DIAGNOSIS — D123 Benign neoplasm of transverse colon: Secondary | ICD-10-CM | POA: Diagnosis not present

## 2022-10-25 ENCOUNTER — Ambulatory Visit (INDEPENDENT_AMBULATORY_CARE_PROVIDER_SITE_OTHER): Payer: PPO | Admitting: Orthopedic Surgery

## 2022-10-25 ENCOUNTER — Encounter: Payer: Self-pay | Admitting: Orthopedic Surgery

## 2022-10-25 VITALS — BP 130/82 | HR 70 | Temp 96.7°F | Resp 17 | Ht 67.0 in | Wt 215.4 lb

## 2022-10-25 DIAGNOSIS — I2510 Atherosclerotic heart disease of native coronary artery without angina pectoris: Secondary | ICD-10-CM | POA: Diagnosis not present

## 2022-10-25 DIAGNOSIS — Z6833 Body mass index (BMI) 33.0-33.9, adult: Secondary | ICD-10-CM

## 2022-10-25 DIAGNOSIS — I10 Essential (primary) hypertension: Secondary | ICD-10-CM | POA: Diagnosis not present

## 2022-10-25 DIAGNOSIS — E1169 Type 2 diabetes mellitus with other specified complication: Secondary | ICD-10-CM | POA: Diagnosis not present

## 2022-10-25 DIAGNOSIS — E669 Obesity, unspecified: Secondary | ICD-10-CM | POA: Diagnosis not present

## 2022-10-25 DIAGNOSIS — Z23 Encounter for immunization: Secondary | ICD-10-CM | POA: Diagnosis not present

## 2022-10-25 NOTE — Patient Instructions (Addendum)
Consider Prevnar 20> pneumonia vaccine> please get at local pharmacy or office  Ok to get Shingrix vaccine> shingrix vaccine  Ice pack for mosquito bites

## 2022-10-25 NOTE — Progress Notes (Signed)
Careteam: Patient Care Team: Octavia Heir, NP as PCP - General (Adult Health Nurse Practitioner) Chilton Si, MD as PCP - Cardiology (Cardiology) Chilton Si, MD as Attending Physician (Cardiology) Dingeldein, Viviann Spare, MD (Ophthalmology) Kathi Der, MD as Consulting Physician (Gastroenterology)  Seen by: Hazle Nordmann, AGNP-C  PLACE OF SERVICE:  [facility]  Advanced Directive information Does Patient Have a Medical Advance Directive?: Yes, Type of Advance Directive: Healthcare Power of [place];Living will;Out of facility DNR (pink MOST or yellow form), Does patient want to make changes to medical advance directive?: No - Patient declined  No Known Allergies  Chief Complaint  Patient presents with   Medical Management of Chronic Issues    Routine Visit.    Immunizations    Discuss the need for Pne vaccine, Shingrix vaccine, 2nd Covid vaccine, and Influenza vaccine.    Health Maintenance    Discuss the need for Diabetic kidney evaluation, AWV, Eye exam, Foot exam, and Hemoglobin A1C.     HPI: Patient is a 72 y.o. male seen today for medical management of chronic conditions.   H/o A1c 8.0 3 years ago. It has slowly trended down. Remains on Metformin. Plan to recheck A1c today. Cardiology recommended Jardiance or [place].   2 years since last encounter. H/o CABG 06/2019. He has been following cardiology. LVEF 50-55%, mild aortic stenosis/dilation noted> repeat in 1 year. Blood pressure at goal. Remains on carvedilol and rosuvastatin.   Colonoscopy with [facility] recently (10/18/2022)> 6 polys? Will request note.   Playing tennis 3x/weekly. Weight down 4 lbs from 08/2022.   Flu vaccine given today.     Review of Systems:  Review of Systems  Constitutional: Negative.   HENT: Negative.    Eyes: Negative.   Respiratory: Negative.    Cardiovascular: Negative.   Gastrointestinal: Negative.   Genitourinary: Negative.   Musculoskeletal: Negative.   Skin:  Negative.   Neurological: Negative.   Psychiatric/Behavioral: Negative.      Past Medical History:  Diagnosis Date   Aortic stenosis    mild to moderate AS 05/2019 echo    CAD in native artery 06/09/2019   Diabetes mellitus type 2 in obese 06/09/2019   Elevated hemoglobin A1c    Essential hypertension 06/09/2019   History of colonoscopy 01/08/2009   History of high cholesterol    Melanoma (HCC)    back   Pure hypercholesterolemia 06/09/2019   Past Surgical History:  Procedure Laterality Date   CARDIAC SURGERY  06/30/2019   CATARACT EXTRACTION W/PHACO Left 09/01/2019   Procedure: CATARACT EXTRACTION PHACO AND INTRAOCULAR LENS PLACEMENT (IOC) LEFT 6.30  00:36.6;  Surgeon: Galen Manila, MD;  Location: [facility];  Service: Ophthalmology;  Laterality: Left;   CATARACT EXTRACTION W/PHACO Right 09/29/2019   Procedure: CATARACT EXTRACTION PHACO AND INTRAOCULAR LENS PLACEMENT (IOC) RIGHT DIABETIC 5.06 00:35.6;  Surgeon: Galen Manila, MD;  Location: [facility];  Service: Ophthalmology;  Laterality: Right;  Diabetic - oral meds   COLONOSCOPY  2003   COLONOSCOPY  01/08/2001   CORONARY ARTERY BYPASS GRAFT N/A 06/30/2019   Procedure: CORONARY ARTERY BYPASS GRAFTING (CABG) x 4, LIMA TO DIAGONAL 1, SVG TO OML, SVG TO RAMUS, SVG TO PDA;  Surgeon: Donata Clay, Theron Arista, MD;  Location: [facility] OR;  Service: Open Heart Surgery;  Laterality: N/A;   ENDOVEIN HARVEST OF GREATER SAPHENOUS VEIN Bilateral 06/30/2019   Procedure: ENDOVEIN HARVEST OF GREATER SAPHENOUS VEIN;  Surgeon: Kerin Perna, MD;  Location: [facility] OR;  Service: Open Heart Surgery;  Laterality: Bilateral;   MELANOMA EXCISION  01/09/2004   back   TEE WITHOUT CARDIOVERSION N/A 06/30/2019   Procedure: TRANSESOPHAGEAL ECHOCARDIOGRAM (TEE);  Surgeon: Donata Clay, Theron Arista, MD;  Location: [facility] OR;  Service: Open Heart Surgery;  Laterality: N/A;   THYROGLOSSAL DUCT CYST  01/09/1999   Social History:   reports that he quit smoking about 45  years ago. His smoking use included cigarettes. He started smoking about 60 years ago. He has a 30 pack-year smoking history. He has never used smokeless tobacco. He reports current alcohol use of about 7.0 - 14.0 standard drinks of alcohol per week. No history on file for drug use.  Family History  Problem Relation Age of Onset   CAD Father    Valvular heart disease Father    Colon cancer Father    Heart disease Maternal Uncle    Heart disease Maternal Grandfather    Kidney failure Mother    Diabetes Mother    Heart disease Mother     Medications: Patient's Medications  New Prescriptions   No medications on file  Previous Medications   ASPIRIN EC 81 MG TABLET    Take 81 mg by mouth daily. Swallow whole.   CARVEDILOL (COREG) 12.5 MG TABLET    Take 1 tablet (12.5 mg total) by mouth 2 (two) times daily with a meal.   METFORMIN (GLUCOPHAGE) 500 MG TABLET    Take 1 tablet (500 mg total) by mouth 2 (two) times daily with a meal.   MULTIPLE VITAMINS-MINERALS (CENTRUM SILVER PO)    Take by mouth daily.   ROSUVASTATIN (CRESTOR) 40 MG TABLET    Take 1 tablet (40 mg total) by mouth at bedtime.  Modified Medications   No medications on file  Discontinued Medications   No medications on file    Physical Exam:  Vitals:   10/25/22 0900  BP: 130/82  Pulse: 70  Resp: 17  Temp: (!) 96.7 F (35.9 C)  SpO2: 97%  Weight: 215 lb 6.4 oz (97.7 kg)  Height: 5\' 7"  (1.702 m)   Body mass index is 33.74 kg/m. Wt Readings from Last 3 Encounters:  10/25/22 215 lb 6.4 oz (97.7 kg)  08/20/22 219 lb (99.3 kg)  04/24/21 219 lb 12.8 oz (99.7 kg)    Physical Exam Vitals reviewed.  Constitutional:      General: He is not in acute distress. HENT:     Head: Normocephalic.  Eyes:     General:        Right eye: No discharge.        Left eye: No discharge.  Cardiovascular:     Rate and Rhythm: Normal rate and regular rhythm.     Pulses: Normal pulses.     Heart sounds: Murmur heard.   Pulmonary:     Effort: Pulmonary effort is normal. No respiratory distress.     Breath sounds: Normal breath sounds. No wheezing.  Abdominal:     General: Bowel sounds are normal.     Palpations: Abdomen is soft.  Musculoskeletal:     Cervical back: Neck supple.     Right lower leg: No edema.     Left lower leg: No edema.  Skin:    General: Skin is warm.     Capillary Refill: Capillary refill takes less than 2 seconds.  Neurological:     General: No focal deficit present.     Mental Status: He is alert and oriented to person, place, and time.  Psychiatric:  Mood and Affect: Mood normal.     Labs reviewed: Basic Metabolic Panel: Recent Labs    08/14/22 0942  NA 138  K 5.1  CL 100  CO2 25  GLUCOSE 276*  BUN 15  CREATININE 1.03  CALCIUM 10.1   Liver Function Tests: Recent Labs    08/14/22 0942  AST 33  ALT 40  ALKPHOS 76  BILITOT 1.2  PROT 7.0  ALBUMIN 4.6   No results for input(s): "LIPASE", "AMYLASE" in the last 8760 hours. No results for input(s): "AMMONIA" in the last 8760 hours. CBC: No results for input(s): "WBC", "NEUTROABS", "HGB", "HCT", "MCV", "PLT" in the last 8760 hours. Lipid Panel: Recent Labs    08/14/22 0942  CHOL 108  HDL 52  LDLCALC 26  TRIG 191*  CHOLHDL 2.1   TSH: No results for input(s): "TSH" in the last 8760 hours. A1C: Lab Results  Component Value Date   HGBA1C 6.2 (H) 11/10/2020     Assessment/Plan 1. Need for influenza vaccination - Flu Vaccine Trivalent High Dose (Fluad)  2. Essential hypertension - controlled - followed by cardiology - cont carvedilol  3. Type 2 diabetes mellitus with obesity (HCC) - improved with weight loss and healthy lifestyle changes - Last A1c 6.2 11/11/2022 - cont asa, carvedilol, metformin and statin  4. CAD in native artery - followed by cardiology - h/o CABG - LVEF 50-55% - cont carvedilol  Total time: 34 minutes. Greater than 50% of total time spent doing patient  education regarding T2DM, HTN including symptom/medication management.     Next appt: Visit date not found  Atalie Oros Scherry Ran  [facility] [phone]

## 2022-10-26 LAB — CBC WITH DIFFERENTIAL/PLATELET
Absolute Lymphocytes: 1023 {cells}/uL (ref 850–3900)
Absolute Monocytes: 800 {cells}/uL (ref 200–950)
Basophils Absolute: 28 {cells}/uL (ref 0–200)
Basophils Relative: 0.3 %
Eosinophils Absolute: 84 {cells}/uL (ref 15–500)
Eosinophils Relative: 0.9 %
HCT: 44.9 % (ref 38.5–50.0)
Hemoglobin: 14.6 g/dL (ref 13.2–17.1)
MCH: 31 pg (ref 27.0–33.0)
MCHC: 32.5 g/dL (ref 32.0–36.0)
MCV: 95.3 fL (ref 80.0–100.0)
MPV: 11.2 fL (ref 7.5–12.5)
Monocytes Relative: 8.6 %
Neutro Abs: 7366 {cells}/uL (ref 1500–7800)
Neutrophils Relative %: 79.2 %
Platelets: 181 10*3/uL (ref 140–400)
RBC: 4.71 10*6/uL (ref 4.20–5.80)
RDW: 11.7 % (ref 11.0–15.0)
Total Lymphocyte: 11 %
WBC: 9.3 10*3/uL (ref 3.8–10.8)

## 2022-10-26 LAB — MICROALBUMIN / CREATININE URINE RATIO
Creatinine, Urine: 135 mg/dL (ref 20–320)
Microalb Creat Ratio: 1001 mg/g{creat} — ABNORMAL HIGH (ref <30)
Microalb, Ur: 135.2 mg/dL

## 2022-10-26 LAB — HEMOGLOBIN A1C
Hgb A1c MFr Bld: 8.9 %{Hb} — ABNORMAL HIGH (ref <5.7)
Mean Plasma Glucose: 209 mg/dL
eAG (mmol/L): 11.6 mmol/L

## 2022-11-02 ENCOUNTER — Other Ambulatory Visit: Payer: Self-pay

## 2022-11-02 DIAGNOSIS — E1169 Type 2 diabetes mellitus with other specified complication: Secondary | ICD-10-CM

## 2022-11-22 ENCOUNTER — Telehealth: Payer: PPO | Admitting: Orthopedic Surgery

## 2022-11-22 ENCOUNTER — Encounter: Payer: Self-pay | Admitting: Orthopedic Surgery

## 2022-11-22 ENCOUNTER — Other Ambulatory Visit: Payer: Self-pay | Admitting: Orthopedic Surgery

## 2022-11-22 DIAGNOSIS — E669 Obesity, unspecified: Secondary | ICD-10-CM

## 2023-01-04 ENCOUNTER — Other Ambulatory Visit: Payer: Self-pay

## 2023-01-04 DIAGNOSIS — E1169 Type 2 diabetes mellitus with other specified complication: Secondary | ICD-10-CM

## 2023-01-08 ENCOUNTER — Encounter (HOSPITAL_BASED_OUTPATIENT_CLINIC_OR_DEPARTMENT_OTHER): Payer: Self-pay | Admitting: Family

## 2023-01-08 ENCOUNTER — Ambulatory Visit (HOSPITAL_BASED_OUTPATIENT_CLINIC_OR_DEPARTMENT_OTHER): Payer: PPO | Admitting: Family

## 2023-01-08 VITALS — BP 126/85 | HR 70 | Ht 67.0 in | Wt 215.3 lb

## 2023-01-08 DIAGNOSIS — E785 Hyperlipidemia, unspecified: Secondary | ICD-10-CM

## 2023-01-08 DIAGNOSIS — I25118 Atherosclerotic heart disease of native coronary artery with other forms of angina pectoris: Secondary | ICD-10-CM | POA: Diagnosis not present

## 2023-01-08 DIAGNOSIS — I10 Essential (primary) hypertension: Secondary | ICD-10-CM | POA: Diagnosis not present

## 2023-01-08 DIAGNOSIS — E1165 Type 2 diabetes mellitus with hyperglycemia: Secondary | ICD-10-CM

## 2023-01-08 NOTE — Patient Instructions (Addendum)
 Medication Instructions:  Your physician recommends that you continue on your current medications as directed. Please refer to the Current Medication list given to you today.  *If you need a refill on your cardiac medications before your next appointment, please call your pharmacy*   Follow-Up: At [facility], you and your health needs are our priority.  As part of our continuing mission to provide you with exceptional heart care, we have created designated Provider Care Teams.  These Care Teams include your primary Cardiologist (physician) and Advanced Practice Providers (APPs -  Physician Assistants and Nurse Practitioners) who all work together to provide you with the care you need, when you need it.  We recommend signing up for the patient portal called MyChart.  Sign up information is provided on this After Visit Summary.  MyChart is used to connect with patients for Virtual Visits (Telemedicine).  Patients are able to view lab/test results, encounter notes, upcoming appointments, etc.  Non-urgent messages can be sent to your provider as well.   To learn more about what you can do with MyChart, go to [email or web address].    Your next appointment:   6 month(s)  Provider:   Annabella Scarce, MD or Reche Finder, NP    Other Instructions  Try nutritional yeast as a cheesy flavor: Nutritional yeast

## 2023-01-08 NOTE — Progress Notes (Signed)
 Cardiology Office Note:  .   Date:  01/08/2023  ID:  Phillip [name], DOB May 04, 1950, MRN [id number] PCP: Gil Greig BRAVO, NP  [facility] Providers Cardiologist:  Annabella Scarce, MD    History of Present Illness: Phillip   Mustapha [name] is a 72 y.o. male with hx of CAD s/p CABG (LIMA-LAD, SVG-OM, RI, PDA), moderate aortic stenosis, HTN, HLD, mild ascending aortic aneurysm, DM2.  Initial evaluation 04/2019 for second opinion of coronary artery disease. Abnormal stress echo at [facility] 05/25/19. Workup at [facility] with LHC revealed 3 vessel obstructive CAD. Echo with LVEF 50%, mild global hypkinesis, normal RV function, mild to moderate AS. He was referred to Dr. Obadiah an underwent CABG 06/2019. Repeat echo 09/2020 LVEF 55%, gr2DD, moderate AS with mean gradient .   Last seen 08/20/2022 with BP elevated clinic but overall mildly elevated in the 130s at home.  He was to make lifestyle changes. Update echo 09/14/2022 low normal LVEF 50 to 50%, distal septal hypokinesis, RV normal, moderate aortic stenosis with mean gradient 25.4 mmHg and mild dilation of ascending aorta 39 mm.  Presents today for follow up independently. Playing tennis 3 times per week for exercise and recently started adding back some strength training. Eating at home and enjoys coooking. Focuses on less processed foods.  Successful lifestyle changes with weight loss since last clinic visit.  Reports no shortness of breath nor dyspnea on exertion. Reports no chest pain, pressure, or tightness. No edema, orthopnea, PND. Reports no palpitations. Bp at [id number] - 126/85. Notes small broken blood vessels in his arms after playing tennis which self resolved after few days, reassurance provided.  Home BP cuff found to read accurately today.  ROS: Please see the history of present illness.    All other systems reviewed and are negative.   Studies Reviewed: .        Cardiac Studies & Procedures       ECHOCARDIOGRAM  ECHOCARDIOGRAM COMPLETE 09/14/2022  Narrative ECHOCARDIOGRAM REPORT    Patient Name:   Phillip [name] Date of Exam: 09/14/2022 Medical Rec #:  [id number]      Height:       67.0 in Accession #:    [id number]     Weight:       219.0 lb Date of Birth:  01-Apr-1950      BSA:          2.102 m Patient Age:    72 years       BP:           138/83 mmHg Patient Gender: M              HR:           58 bpm. Exam Location:  Outpatient  Procedure: 2D Echo, 3D Echo, Cardiac Doppler and Color Doppler  Indications:    I35.2 Nonrheumatic aortic (valve) stenosis with insufficiency  History:        Patient has prior history of Echocardiogram examinations, most recent 09/19/2020. CAD, Prior CABG, Aortic Valve Disease; Risk Factors:Hypertension, Family History of Coronary Artery Disease, Diabetes, Dyslipidemia and Former Smoker. Patient denies chest pain, SOB and leg edema.  Sonographer:    Annabella Cater RVT, RDCS (AE), RDMS Referring Phys: [phone] Shawnna Pancake S Lonnette Shrode  IMPRESSIONS   1. Distal septal hypokinesis . Left ventricular ejection fraction, by estimation, is 50 to 55%. The left ventricle has low normal function. The left ventricle has no regional wall motion abnormalities. Left ventricular diastolic parameters  were normal. 2. Right ventricular systolic function is normal. The right ventricular size is normal. 3. Left atrial size was moderately dilated. 4. The mitral valve is abnormal. Trivial mitral valve regurgitation. No evidence of mitral stenosis. 5. The aortic valve is tricuspid. There is moderate calcification of the aortic valve. There is moderate thickening of the aortic valve. Aortic valve regurgitation is mild. Moderate aortic valve stenosis. 6. Aortic dilatation noted. There is mild dilatation of the ascending aorta, measuring 39 mm. 7. The inferior vena cava is normal in size with greater than 50% respiratory variability, suggesting right atrial pressure of 3  mmHg.  Comparison(s): EF 55%, moderate AS, mean 21, peak 36.7 mmHg, asc aorta 41 mm.  FINDINGS Left Ventricle: Distal septal hypokinesis. Left ventricular ejection fraction, by estimation, is 50 to 55%. The left ventricle has low normal function. The left ventricle has no regional wall motion abnormalities. The left ventricular internal cavity size was normal in size. There is no left ventricular hypertrophy. Left ventricular diastolic parameters were normal.  Right Ventricle: The right ventricular size is normal. No increase in right ventricular wall thickness. Right ventricular systolic function is normal.  Left Atrium: Left atrial size was moderately dilated.  Right Atrium: Right atrial size was normal in size.  Pericardium: There is no evidence of pericardial effusion.  Mitral Valve: The mitral valve is abnormal. There is mild thickening of the mitral valve leaflet(s). Trivial mitral valve regurgitation. No evidence of mitral valve stenosis.  Tricuspid Valve: The tricuspid valve is normal in structure. Tricuspid valve regurgitation is not demonstrated. No evidence of tricuspid stenosis.  Aortic Valve: The aortic valve is tricuspid. There is moderate calcification of the aortic valve. There is moderate thickening of the aortic valve. Aortic valve regurgitation is mild. Aortic regurgitation PHT measures 754 msec. Moderate aortic stenosis is present. Aortic valve mean gradient measures 25.4 mmHg. Aortic valve peak gradient measures 44.1 mmHg. Aortic valve area, by VTI measures 1.24 cm.  Pulmonic Valve: The pulmonic valve was normal in structure. Pulmonic valve regurgitation is trivial. No evidence of pulmonic stenosis.  Aorta: Aortic dilatation noted. There is mild dilatation of the ascending aorta, measuring 39 mm.  Venous: The inferior vena cava is normal in size with greater than 50% respiratory variability, suggesting right atrial pressure of 3 mmHg.  IAS/Shunts: No atrial level  shunt detected by color flow Doppler.   LEFT VENTRICLE PLAX 2D LVIDd:         4.56 cm     Diastology LVIDs:         3.02 cm     LV e' medial:    4.90 cm/s LV PW:         1.08 cm     LV E/e' medial:  14.8 LV IVS:        0.93 cm     LV e' lateral:   8.33 cm/s LVOT diam:     2.40 cm     LV E/e' lateral: 8.7 LV SV:         102 LV SV Index:   48 LVOT Area:     4.52 cm  3D Volume EF: LV Volumes (MOD)           3D EF:        63 % LV vol d, MOD A4C: 86.9 ml LV EDV:       112 ml LV vol s, MOD A4C: 32.2 ml LV ESV:       42 ml LV SV  MOD A4C:     86.9 ml LV SV:        70 ml  RIGHT VENTRICLE RV S prime:     8.50 cm/s TAPSE (M-mode): 1.6 cm  LEFT ATRIUM             Index        RIGHT ATRIUM           Index LA diam:        4.80 cm 2.28 cm/m   RA Area:     16.50 cm LA Vol (A2C):   78.1 ml 37.16 ml/m  RA Volume:   38.90 ml  18.51 ml/m LA Vol (A4C):   97.8 ml 46.53 ml/m LA Biplane Vol: 85.9 ml 40.87 ml/m AORTIC VALVE                     PULMONIC VALVE AV Area (Vmax):    1.30 cm      PV Vmax:          1.25 m/s AV Area (Vmean):   1.17 cm      PV Peak grad:     6.3 mmHg AV Area (VTI):     1.24 cm      PR End Diast Vel: 3.88 msec AV Vmax:           332.00 cm/s AV Vmean:          237.200 cm/s AV VTI:            0.823 m AV Peak Grad:      44.1 mmHg AV Mean Grad:      25.4 mmHg LVOT Vmax:         95.10 cm/s LVOT Vmean:        61.200 cm/s LVOT VTI:          0.225 m LVOT/AV VTI ratio: 0.27 AI PHT:            754 msec  AORTA Ao Root diam: 3.40 cm Ao Asc diam:  3.90 cm Ao Arch diam: 3.3 cm  MITRAL VALVE               TRICUSPID VALVE MV Area (PHT): 4.57 cm    TR Peak grad:   7.3 mmHg MV Decel Time: 166 msec    TR Vmax:        135.00 cm/s MV E velocity: 72.70 cm/s MV A velocity: 55.40 cm/s  SHUNTS MV E/A ratio:  1.31        Systemic VTI:  0.22 m Systemic Diam: 2.40 cm  Maude Emmer MD Electronically signed by Maude Emmer MD Signature Date/Time: 09/14/2022/9:05:32  AM    Final  TEE  ECHO INTRAOPERATIVE TEE 06/30/2019  Interpretation Summary   Left ventricle: Normal cavity size and wall thickness. Wall motion is abnormal.   Left atrium: Left atrial appendage filling and emptying velocities are normal.   Aortic valve: Moderate valve thickening present. Mild valve calcification present. Mild stenosis. Mild regurgitation with a centrally directed jet.   Mitral valve:  Mild mitral annular calcification. Mild regurgitation. The annulus is mildy.   Right ventricle:  Normal ejection fraction.   Tricuspid valve: Mild regurgitation. The tricuspid valve regurgitation jet is central.            Risk Assessment/Calculations:             Physical Exam:   VS:  BP 126/85 Comment: home BP  Pulse 70   Ht 5' 7 (1.702 m)  Wt 215 lb 4.8 oz (97.7 kg)   SpO2 98%   BMI 33.72 kg/m    Wt Readings from Last 3 Encounters:  01/08/23 215 lb 4.8 oz (97.7 kg)  10/25/22 215 lb 6.4 oz (97.7 kg)  08/20/22 219 lb (99.3 kg)     Vitals:   01/08/23 0805 01/08/23 0810  BP: (!) 142/88 126/85 Comment: home BP  Pulse: 70   Height: 5' 7 (1.702 m)   Weight: 215 lb 4.8 oz (97.7 kg)   SpO2: 98%   BMI (Calculated): 33.71     GEN: Well nourished, well developed in no acute distress NECK: No JVD; No carotid bruits CARDIAC: RRR, no murmurs, rubs, gallops RESPIRATORY:  Clear to auscultation without rales, wheezing or rhonchi  ABDOMEN: Soft, non-tender, non-distended EXTREMITIES:  No edema; No deformity   ASSESSMENT AND PLAN: .    CAD - Stable with no anginal symptoms. No indication for ischemic evaluation.  GDMT aspirin  81mg  daily, Coreg  12.5mg  BID, Crestor  40mg  daily. Recommend aiming for 150 minutes of moderate intensity activity per week and following a heart healthy diet.    Moderate AS -stable with echo 09/2022.  Repeat echo 09/2023 already ordered.  Continue optimal blood pressure control.  Whitecoat hypertension superimposed upon HTN - BP elevated in clinic  142/88 with home readings ranging 112/763 - 126/85. BP controlled by home readings, continue Coreg  12.5mg  BID. We discussed if BP persistently <110 or symptomatic with lightheadedness could reduce dose Coreg  in the future.  HLD - 08/14/22 total cholesterol 108, triglycerides 191, HDL 52, LDL 26, normal liver enzymes. Continue Rosuvastatin  40mg  daily.   DM2 - 11/2020 A1c 6.2 ? 10/2022 A1c 8.9. Presently on Metformin  500mg  BID. Has been making lifestyle changes and successfully lost weight. Prefers lifestyle changes when possible over additional medications. Noted family history of diabetes. Has follow up labs next month, discussed if A1c improving would be reasonable to continue current regimen with continued lifestyle changes. If A1c not improved, consider addition of Jardiance  or Farxiga for cardioprotective benefit. Referred to Nutrition & Diabetic education.        Dispo: follow up in 6 months  Signed, Reche GORMAN Finder, NP

## 2023-02-01 ENCOUNTER — Other Ambulatory Visit: Payer: PPO

## 2023-02-08 ENCOUNTER — Ambulatory Visit (HOSPITAL_BASED_OUTPATIENT_CLINIC_OR_DEPARTMENT_OTHER): Payer: PPO | Admitting: Family Medicine

## 2023-03-18 ENCOUNTER — Encounter (HOSPITAL_BASED_OUTPATIENT_CLINIC_OR_DEPARTMENT_OTHER): Payer: Self-pay

## 2023-03-18 MED ORDER — METFORMIN HCL 500 MG PO TABS: 500.0000 mg | ORAL_TABLET | Freq: Two times a day (BID) | ORAL | 3 refills | Status: DC

## 2023-03-21 ENCOUNTER — Encounter (HOSPITAL_BASED_OUTPATIENT_CLINIC_OR_DEPARTMENT_OTHER): Payer: Self-pay | Admitting: Family Medicine

## 2023-03-21 ENCOUNTER — Ambulatory Visit (INDEPENDENT_AMBULATORY_CARE_PROVIDER_SITE_OTHER): Payer: PPO | Admitting: Family Medicine

## 2023-03-21 VITALS — BP 150/80 | HR 66 | Ht 67.0 in | Wt 215.5 lb

## 2023-03-21 DIAGNOSIS — E669 Obesity, unspecified: Secondary | ICD-10-CM | POA: Diagnosis not present

## 2023-03-21 DIAGNOSIS — Z7984 Long term (current) use of oral hypoglycemic drugs: Secondary | ICD-10-CM | POA: Diagnosis not present

## 2023-03-21 DIAGNOSIS — T148XXA Other injury of unspecified body region, initial encounter: Secondary | ICD-10-CM | POA: Diagnosis not present

## 2023-03-21 DIAGNOSIS — I10 Essential (primary) hypertension: Secondary | ICD-10-CM | POA: Diagnosis not present

## 2023-03-21 DIAGNOSIS — E1169 Type 2 diabetes mellitus with other specified complication: Secondary | ICD-10-CM | POA: Diagnosis not present

## 2023-03-21 NOTE — Progress Notes (Signed)
 Subjective:   Phillip [name] Aug 14, 1950 03/21/2023  Chief Complaint  Patient presents with   New Patient (Initial Visit)    Patient is here today to get established with the practice. Denies any main concerns for today's visit.    HPI: Phillip [name] presents today for re-assessment and management of chronic medical conditions.  DIABETES MELLITUS: Phillip [name] presents for the medical management of diabetes.  Current diabetes medication regimen: Metformin 500 BID  Patient is  adhering to a diabetic diet.  Patient is  exercising regularly. Playing tennis 2-3 x a week. 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  Patient is not checking BS regularly.  Patient is  checking their feet regularly.  Denies polydipsia, polyphagia, polyuria, open wounds or ulcers on feet.  Lab Results  Component Value Date   HGBA1C 8.9 (H) 10/25/2022    Foot Exam: 03/21/2023 Lab Results  Component Value Date   MICROALBUR 135.2 10/25/2022    Wt Readings from Last 3 Encounters:  03/21/23 215 lb 8 oz (97.8 kg)  01/08/23 215 lb 4.8 oz (97.7 kg)  10/25/22 215 lb 6.4 oz (97.7 kg)    HYPERTENSION: Phillip [name] presents for the medical management of hypertension.  Patient's current hypertension medication regimen is: Coreg 12.5mg  BID  Patient is  currently taking prescribed medications for HTN. (Has not taken medication today)  Patient is  regularly keeping a check on BP at home.  Adhering to low sodium diet: Yes Exercising Regularly: Yes  Denies headache, dizziness, CP, SHOB, vision changes.   BP Readings from Last 3 Encounters:  03/21/23 (!) 150/80  01/08/23 126/85  10/25/22 130/82   BRUISING: Patient states he has noticed increase of purple-red lesions present to bilateral upper arms after exercise, especially tennis.  He has noticed a few appearing to his neck and chest.  He states these resolved after 1 to 2 days.  He denies easily bleeding with any cut or tear, GI  bleeding, change in diet.  Patient does take aspirin 81 mg EC daily.  He does drink wine 2 glasses daily.  The following portions of the patient's history were reviewed and updated as appropriate: past medical history, past surgical history, family history, social history, allergies, medications, and problem list.   Patient Active Problem List   Diagnosis Date Noted   History of melanoma 11/02/2019   Family history of colon cancer 11/02/2019   History of adenomatous polyp of colon 11/02/2019   Uncontrolled type 2 diabetes mellitus with hyperglycemia (HCC) 11/02/2019   Postop check 10/07/2019   S/P CABG x 4 06/30/2019   Coronary artery disease 06/30/2019   CAD in native artery 06/09/2019   Essential hypertension 06/09/2019   Pure hypercholesterolemia 06/09/2019   Type 2 diabetes mellitus with obesity (HCC) 06/09/2019   Hyperlipidemia 06/09/2019   Positive cardiac stress test 05/26/2019   Diastasis recti 04/13/2019   Umbilical hernia with obstruction, without gangrene 04/13/2019   Family history of coronary artery disease 11/06/2016   Hemorrhoids 01/21/2012   Melanoma of skin (HCC) 03/07/2011   Past Medical History:  Diagnosis Date   Aortic stenosis    mild to moderate AS 05/2019 echo    CAD in native artery 06/09/2019   Cataract    Diabetes mellitus type 2 in obese 06/09/2019   Elevated hemoglobin A1c    Essential hypertension 06/09/2019   Heart murmur    History of colonoscopy 01/08/2009   History of high cholesterol    Melanoma (HCC)  back   Pure hypercholesterolemia 06/09/2019   Past Surgical History:  Procedure Laterality Date   CARDIAC SURGERY  06/30/2019   CATARACT EXTRACTION W/PHACO Left 09/01/2019   Procedure: CATARACT EXTRACTION PHACO AND INTRAOCULAR LENS PLACEMENT (IOC) LEFT 6.30  00:36.6;  Surgeon: Galen Manila, MD;  Location: [facility];  Service: Ophthalmology;  Laterality: Left;   CATARACT EXTRACTION W/PHACO Right 09/29/2019   Procedure:  CATARACT EXTRACTION PHACO AND INTRAOCULAR LENS PLACEMENT (IOC) RIGHT DIABETIC 5.06 00:35.6;  Surgeon: Galen Manila, MD;  Location: [facility];  Service: Ophthalmology;  Laterality: Right;  Diabetic - oral meds   COLONOSCOPY  2003   COLONOSCOPY  01/08/2001   CORONARY ARTERY BYPASS GRAFT N/A 06/30/2019   Procedure: CORONARY ARTERY BYPASS GRAFTING (CABG) x 4, LIMA TO DIAGONAL 1, SVG TO OML, SVG TO RAMUS, SVG TO PDA;  Surgeon: Donata Clay, Theron Arista, MD;  Location[facility] OR;  Service: Open Heart Surgery;  Laterality: N/A;   ENDOVEIN HARVEST OF GREATER SAPHENOUS VEIN Bilateral 06/30/2019   Procedure: ENDOVEIN HARVEST OF GREATER SAPHENOUS VEIN;  Surgeon: Kerin Perna, MD;  Location: [facility] OR;  Service: Open Heart Surgery;  Laterality: Bilateral;   EYE SURGERY     MELANOMA EXCISION  01/09/2004   back   TEE WITHOUT CARDIOVERSION N/A 06/30/2019   Procedure: TRANSESOPHAGEAL ECHOCARDIOGRAM (TEE);  Surgeon: Donata Clay, Theron Arista, MD;  Location: [facility] OR;  Service: Open Heart Surgery;  Laterality: N/A;   THYROGLOSSAL DUCT CYST  01/09/1999   Family History  Problem Relation Age of Onset   CAD Father    Valvular heart disease Father    Colon cancer Father    Cancer Father    Heart disease Maternal Uncle    Heart disease Maternal Grandfather    Kidney failure Mother    Diabetes Mother    Heart disease Mother    Outpatient Medications Prior to Visit  Medication Sig Dispense Refill   aspirin EC 81 MG tablet Take 81 mg by mouth daily. Swallow whole.     carvedilol (COREG) 12.5 MG tablet Take 1 tablet (12.5 mg total) by mouth 2 (two) times daily with a meal. 180 tablet 3   metFORMIN (GLUCOPHAGE) 500 MG tablet Take 1 tablet (500 mg total) by mouth 2 (two) times daily with a meal. 180 tablet 3   Multiple Vitamins-Minerals (CENTRUM SILVER PO) Take by mouth daily.     rosuvastatin (CRESTOR) 40 MG tablet Take 1 tablet (40 mg total) by mouth at bedtime. 90 tablet 3   No facility-administered medications prior  to visit.   No Known Allergies   ROS: A complete ROS was performed with pertinent positives/negatives noted in the HPI. The remainder of the ROS are negative.    Objective:   Today's Vitals   03/21/23 0923 03/21/23 0950  BP: (!) 172/87 (!) 150/80  Pulse: 66   SpO2: 97%   Weight: 215 lb 8 oz (97.8 kg)   Height: 5\' 7"  (1.702 m)     Physical Exam          GENERAL: Well-appearing, in NAD. Well nourished.  SKIN: Pink, warm and dry.  Mild petechial rash present to bilateral upper extremities and scattered across neck. Head: Normocephalic. NECK: Trachea midline. Full ROM w/o pain or tenderness.  THROAT: Uvula midline. Oropharynx clear. Mucous membranes pink and moist.  RESPIRATORY: Chest wall symmetrical. Respirations even and non-labored. Breath sounds clear to auscultation bilaterally.  CARDIAC: S1, S2 present, regular rate and rhythm with grade 2 systolic murmur.Peripheral pulses 2+  bilaterally.  Carotid arteries without bruit or thrill. MSK: Muscle tone and strength appropriate for age. Joints w/o tenderness, redness, or swelling.  EXTREMITIES: Without clubbing, cyanosis, or edema.  NEUROLOGIC: No motor or sensory deficits. Steady, even gait. C2-C12 intact.  PSYCH/MENTAL STATUS: Alert, oriented x 3. Cooperative, appropriate mood and affect.    Diabetic Foot Exam - Simple   Simple Foot Form Diabetic Foot exam was performed with the following findings: Yes 03/21/2023 10:00 AM  Visual Inspection No deformities, no ulcerations, no other skin breakdown bilaterally: Yes Sensation Testing Intact to touch and monofilament testing bilaterally: Yes Pulse Check Posterior Tibialis and Dorsalis pulse intact bilaterally: Yes Comments     Health Maintenance Due  Topic Date Due   Zoster Vaccines- Shingrix (1 of 2) 02/07/1970   OPHTHALMOLOGY EXAM  10/25/2020      Assessment & Plan:  1. Type 2 diabetes mellitus with obesity (HCC) (Primary) Uncontrolled with recent A1c in October  2024.  Patient to continue metformin and possibly consider [place] or Jardiance pending A1c drawn today.  Will obtain CMP as well to evaluate renal function with type 2 diabetes prior to starting SGLT2 if needed.  Discussed good dietary changes, exercise.  Patient is motivated and has done well with diet changes since October. - Hemoglobin A1c - Comprehensive metabolic panel  2. Bruising Possibly due to antiplatelet therapy with aspirin.  Will check a CBC, PT PTT and CMP to evaluate for acute cause of bleeding. - CBC with Differential - PT and PTT  3. Essential hypertension Well-controlled.  Patient's log indicates control while at home and has had blood pressure monitor verified with cardiology.  Will continue with cardiology management of hypertension.   No orders of the defined types were placed in this encounter.  Lab Orders         Hemoglobin A1c         Comprehensive metabolic panel         CBC with Differential         PT and PTT      Return in about 6 months (around 09/21/2023) for DIABETES CHECK UP.    Patient to reach out to office if new, worrisome, or unresolved symptoms arise or if no improvement in patient's condition. Patient verbalized understanding and is agreeable to treatment plan. All questions answered to patient's satisfaction.    Hilbert Bible, [place]

## 2023-03-21 NOTE — Patient Instructions (Addendum)
 Prevnar 20- Get at your local pharmacy

## 2023-03-22 LAB — PT AND PTT
INR: 1.1 (ref 0.9–1.2)
Prothrombin Time: 11.8 s (ref 9.1–12.0)
aPTT: 28 s (ref 24–33)

## 2023-03-22 LAB — CBC WITH DIFFERENTIAL/PLATELET
Basophils Absolute: 0 10*3/uL (ref 0.0–0.2)
Basos: 0 %
EOS (ABSOLUTE): 0.1 10*3/uL (ref 0.0–0.4)
Eos: 1 %
Hematocrit: 47.2 % (ref 37.5–51.0)
Hemoglobin: 15.9 g/dL (ref 13.0–17.7)
Immature Grans (Abs): 0 10*3/uL (ref 0.0–0.1)
Immature Granulocytes: 0 %
Lymphocytes Absolute: 1.2 10*3/uL (ref 0.7–3.1)
Lymphs: 17 %
MCH: 31.7 pg (ref 26.6–33.0)
MCHC: 33.7 g/dL (ref 31.5–35.7)
MCV: 94 fL (ref 79–97)
Monocytes Absolute: 0.6 10*3/uL (ref 0.1–0.9)
Monocytes: 8 %
Neutrophils Absolute: 5.1 10*3/uL (ref 1.4–7.0)
Neutrophils: 74 %
Platelets: 183 10*3/uL (ref 150–450)
RBC: 5.01 x10E6/uL (ref 4.14–5.80)
RDW: 12.7 % (ref 11.6–15.4)
WBC: 6.9 10*3/uL (ref 3.4–10.8)

## 2023-03-22 LAB — COMPREHENSIVE METABOLIC PANEL
ALT: 47 IU/L — ABNORMAL HIGH (ref 0–44)
AST: 34 IU/L (ref 0–40)
Albumin: 4.7 g/dL (ref 3.8–4.8)
Alkaline Phosphatase: 73 IU/L (ref 44–121)
BUN/Creatinine Ratio: 18 (ref 10–24)
BUN: 16 mg/dL (ref 8–27)
Bilirubin Total: 1 mg/dL (ref 0.0–1.2)
CO2: 24 mmol/L (ref 20–29)
Calcium: 10.3 mg/dL — ABNORMAL HIGH (ref 8.6–10.2)
Chloride: 99 mmol/L (ref 96–106)
Creatinine, Ser: 0.91 mg/dL (ref 0.76–1.27)
Globulin, Total: 2.1 g/dL (ref 1.5–4.5)
Glucose: 262 mg/dL — ABNORMAL HIGH (ref 70–99)
Potassium: 5.4 mmol/L — ABNORMAL HIGH (ref 3.5–5.2)
Sodium: 138 mmol/L (ref 134–144)
Total Protein: 6.8 g/dL (ref 6.0–8.5)
eGFR: 90 mL/min/{1.73_m2} (ref >59)

## 2023-03-22 LAB — HEMOGLOBIN A1C
Est. average glucose Bld gHb Est-mCnc: 209 mg/dL
Hgb A1c MFr Bld: 8.9 % — ABNORMAL HIGH (ref 4.8–5.6)

## 2023-03-23 ENCOUNTER — Encounter (HOSPITAL_BASED_OUTPATIENT_CLINIC_OR_DEPARTMENT_OTHER): Payer: Self-pay | Admitting: Family Medicine

## 2023-03-23 NOTE — Progress Notes (Signed)
 Hi Phillip [name],  Your A1C is still at 8.9. If you would like to try the [place] or Marcelline Deist (if insurance covers) with your Metformin, I would prescribe for you to help achieve a lower A1C and reduce risk. Your CBC and PT/INR were normal without signs contributing to acute bleeding. Please  continue to monitor bruising and if worsening, reach out. Your potassium was slightly elevated and may be diet related. Will recommend recheck w/ lab visit in 3 weeks with increasing clear hydration. Please reply if you would like to try the new medication.

## 2023-04-04 DIAGNOSIS — L570 Actinic keratosis: Secondary | ICD-10-CM | POA: Diagnosis not present

## 2023-04-04 DIAGNOSIS — L738 Other specified follicular disorders: Secondary | ICD-10-CM | POA: Diagnosis not present

## 2023-04-04 DIAGNOSIS — L218 Other seborrheic dermatitis: Secondary | ICD-10-CM | POA: Diagnosis not present

## 2023-04-04 DIAGNOSIS — Z129 Encounter for screening for malignant neoplasm, site unspecified: Secondary | ICD-10-CM | POA: Diagnosis not present

## 2023-04-04 DIAGNOSIS — Z85820 Personal history of malignant melanoma of skin: Secondary | ICD-10-CM | POA: Diagnosis not present

## 2023-04-04 DIAGNOSIS — D225 Melanocytic nevi of trunk: Secondary | ICD-10-CM | POA: Diagnosis not present

## 2023-05-02 ENCOUNTER — Ambulatory Visit: Payer: PPO | Admitting: Orthopedic Surgery

## 2023-05-27 DIAGNOSIS — H26491 Other secondary cataract, right eye: Secondary | ICD-10-CM | POA: Diagnosis not present

## 2023-05-27 DIAGNOSIS — Z961 Presence of intraocular lens: Secondary | ICD-10-CM | POA: Diagnosis not present

## 2023-06-20 ENCOUNTER — Ambulatory Visit (INDEPENDENT_AMBULATORY_CARE_PROVIDER_SITE_OTHER): Admitting: Family Medicine

## 2023-06-20 ENCOUNTER — Encounter (HOSPITAL_BASED_OUTPATIENT_CLINIC_OR_DEPARTMENT_OTHER): Payer: Self-pay | Admitting: *Deleted

## 2023-06-20 ENCOUNTER — Encounter (HOSPITAL_BASED_OUTPATIENT_CLINIC_OR_DEPARTMENT_OTHER): Payer: Self-pay | Admitting: Family Medicine

## 2023-06-20 VITALS — BP 132/70 | HR 74 | Ht 67.0 in | Wt 212.0 lb

## 2023-06-20 DIAGNOSIS — Z125 Encounter for screening for malignant neoplasm of prostate: Secondary | ICD-10-CM

## 2023-06-20 DIAGNOSIS — R21 Rash and other nonspecific skin eruption: Secondary | ICD-10-CM

## 2023-06-20 DIAGNOSIS — I10 Essential (primary) hypertension: Secondary | ICD-10-CM | POA: Diagnosis not present

## 2023-06-20 DIAGNOSIS — Z85820 Personal history of malignant melanoma of skin: Secondary | ICD-10-CM | POA: Diagnosis not present

## 2023-06-20 DIAGNOSIS — Z7984 Long term (current) use of oral hypoglycemic drugs: Secondary | ICD-10-CM

## 2023-06-20 DIAGNOSIS — E1165 Type 2 diabetes mellitus with hyperglycemia: Secondary | ICD-10-CM

## 2023-06-20 DIAGNOSIS — M722 Plantar fascial fibromatosis: Secondary | ICD-10-CM | POA: Diagnosis not present

## 2023-06-20 DIAGNOSIS — E1169 Type 2 diabetes mellitus with other specified complication: Secondary | ICD-10-CM | POA: Diagnosis not present

## 2023-06-20 DIAGNOSIS — E669 Obesity, unspecified: Secondary | ICD-10-CM | POA: Diagnosis not present

## 2023-06-20 NOTE — Telephone Encounter (Signed)
 Please see mychart message sent by pt and advise.

## 2023-06-20 NOTE — Progress Notes (Signed)
 Subjective:   Phillip [name] Aug 29, 1950 06/20/2023  Chief Complaint  Patient presents with   Medical Management of Chronic Issues    Pt is here today for a follow up. Denies any main concerns for today's visit.    HPI: Phillip [name] presents today for re-assessment and management of chronic medical conditions.  RASH:  Patient reports chronic rash present to bilateral upper and lower extremities. He was previously given steroid cream by dermatology but was told to stop use as it was likely a steroid induced rash. He is using CeraVe 2-3x per day as directed by derm. Denies itching, drainage, or progression. He does have a hx of melanoma and needs to establish with new Derm as his previous provider left the practice.   DIABETES MELLITUS: Phillip [name] presents for the medical management of diabetes.  Current diabetes medication regimen: Metformin  500 BID  Patient is  adhering to a diabetic diet.  Patient is  exercising regularly. Playing tennis 2-3 x a week. 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  Patient is not checking BS regularly.  Patient is  checking their feet regularly.  Denies polydipsia, polyphagia, polyuria, open wounds or ulcers on feet.  Lab Results  Component Value Date   HGBA1C 8.9 (H) 03/21/2023    Foot Exam: 03/21/2023 Lab Results  Component Value Date   MICROALBUR 135.2 10/25/2022    Wt Readings from Last 3 Encounters:  06/20/23 212 lb (96.2 kg)  03/21/23 215 lb 8 oz (97.8 kg)  01/08/23 215 lb 4.8 oz (97.7 kg)     HYPERTENSION: Phillip [name] presents for the medical management of hypertension.  Patient's current hypertension medication regimen is: Coreg  12.5mg  BID  Patient is  currently taking prescribed medications for HTN. Patient is  regularly keeping a check on BP at home. Average 117 systolic.  Adhering to low sodium diet: Yes Exercising Regularly: Yes  Denies headache, dizziness, CP, SHOB, vision changes.      Plantar Fasciitis:  Patient reports intermittent heel pain. He reports he is barefoot often. He is active with tennis, exercise, gardening. Reports pain is worse in the morning to right heel upon waking up and improves with movement. Denies numbness, tingling, accident or injury.   PSA:  He would like a PSA checked. He is asymptomatic for BPH currently. Denies increased frequency, urgency, hesitancy or nocturia.   The following portions of the patient's history were reviewed and updated as appropriate: past medical history, past surgical history, family history, social history, allergies, medications, and problem list.   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   Coronary artery disease 06/30/2019   CAD in native artery 06/09/2019   Essential hypertension 06/09/2019   Pure hypercholesterolemia 06/09/2019   Type 2 diabetes mellitus with obesity (HCC) 06/09/2019   Hyperlipidemia 06/09/2019   Positive cardiac stress test 05/26/2019   Diastasis recti 04/13/2019   Umbilical hernia with obstruction, without gangrene 04/13/2019   Family history of coronary artery disease 11/06/2016   Hemorrhoids 01/21/2012   Melanoma of skin (HCC) 03/07/2011   Past Medical History:  Diagnosis Date   Aortic stenosis    mild to moderate AS  05/2019 echo    CAD in native artery 06/09/2019   Cataract    Diabetes mellitus type 2 in obese 06/09/2019   Elevated hemoglobin A1c    Essential hypertension 06/09/2019   Heart murmur    History of colonoscopy 01/08/2009   History of high cholesterol    Melanoma (HCC)    back   Pure hypercholesterolemia 06/09/2019   Past Surgical History:  Procedure  Laterality Date   CARDIAC SURGERY  06/30/2019   CATARACT EXTRACTION W/PHACO Left 09/01/2019   Procedure: CATARACT EXTRACTION PHACO AND INTRAOCULAR LENS PLACEMENT (IOC) LEFT 6.30  00:36.6;  Surgeon: Clair Crews, MD;  Location: [facility];  Service: Ophthalmology;  Laterality: Left;   CATARACT EXTRACTION W/PHACO Right 09/29/2019   Procedure: CATARACT EXTRACTION PHACO AND INTRAOCULAR LENS PLACEMENT (IOC) RIGHT DIABETIC 5.06 00:35.6;  Surgeon: Clair Crews, MD;  Location: [facility];  Service: Ophthalmology;  Laterality: Right;  Diabetic - oral meds   COLONOSCOPY  2003   COLONOSCOPY  01/08/2001   CORONARY ARTERY BYPASS GRAFT N/A 06/30/2019   Procedure: CORONARY ARTERY BYPASS GRAFTING (CABG) x 4, LIMA TO DIAGONAL 1, SVG TO OML, SVG TO RAMUS, SVG TO PDA;  Surgeon: Matt Song, Donata Fryer, MD;  Location: [facility] OR;  Service: Open Heart Surgery;  Laterality: N/A;   ENDOVEIN HARVEST OF GREATER SAPHENOUS VEIN Bilateral 06/30/2019   Procedure: ENDOVEIN HARVEST OF GREATER SAPHENOUS VEIN;  Surgeon: Heriberto London, MD;  Location: [facility] OR;  Service: Open Heart Surgery;  Laterality: Bilateral;   EYE SURGERY     MELANOMA EXCISION  01/09/2004   back   TEE WITHOUT CARDIOVERSION N/A 06/30/2019   Procedure: TRANSESOPHAGEAL ECHOCARDIOGRAM (TEE);  Surgeon: Matt Song, Donata Fryer, MD;  Location: [facility] OR;  Service: Open Heart Surgery;  Laterality: N/A;   THYROGLOSSAL DUCT CYST  01/09/1999   Family History  Problem Relation Age of Onset   CAD Father    Valvular heart disease Father    Colon cancer Father    Cancer Father    Heart disease Maternal Uncle    Heart disease Maternal Grandfather    Kidney failure Mother    Diabetes Mother    Heart disease Mother    Outpatient Medications Prior to Visit  Medication Sig Dispense Refill   aspirin  EC 81 MG tablet Take 81 mg by mouth daily. Swallow whole.     carvedilol  (COREG ) 12.5 MG tablet Take 1 tablet (12.5 mg total) by mouth 2 (two) times daily with a  meal. 180 tablet 3   metFORMIN  (GLUCOPHAGE ) 500 MG tablet Take 1 tablet (500 mg total) by mouth 2 (two) times daily with a meal. 180 tablet 3   Multiple Vitamins-Minerals (CENTRUM SILVER PO) Take by mouth daily.     rosuvastatin  (CRESTOR ) 40 MG tablet Take 1 tablet (40 mg total) by mouth at bedtime. 90 tablet 3   No facility-administered medications prior to visit.   No Known Allergies   ROS: A complete ROS was performed with pertinent positives/negatives noted in the HPI. The remainder of the ROS are negative.    Objective:   Today's Vitals   06/20/23 0843  BP: 132/70  Pulse: 74  SpO2: 98%  Weight: 212 lb (96.2 kg)  Height: 5' 7 (1.702 m)    Physical Exam          GENERAL: Well-appearing, in NAD. Well nourished.  SKIN: Pink, warm and dry. Macular dry flaky rash without pattern present to upper and lower bilateral extremities without itching, drainage.  Head:  Normocephalic. NECK: Trachea midline. Full ROM w/o pain or tenderness.  RESPIRATORY: Chest wall symmetrical. Respirations even and non-labored. Breath sounds clear to auscultation bilaterally.  CARDIAC: S1, S2 present, regular rate and rhythm without murmur or gallops. Peripheral pulses 2+ bilaterally.  MSK: Muscle tone and strength appropriate for age.  NEUROLOGIC: No motor or sensory deficits. Steady, even gait. C2-C12 intact.  PSYCH/MENTAL STATUS: Alert, oriented x 3. Cooperative, appropriate mood and affect.     Assessment & Plan:  1. Type 2 diabetes mellitus with hyperglycemia, without long-term current use of insulin  (HCC) (Primary) Will assess control with A1c lab work today.  Patient continues on metformin .  He was recommended to start Jardiance or Farxiga, but want to make dietary changes and regular exercise to see if this improves his A1c.  He has completed his eye exam in the past year with ophthalmologist in [place].  Will obtain records to update health maintenance. - Hemoglobin A1c  2. History of  melanoma Referral placed to dermatology to establish care for skin checks as recommended. - Ambulatory referral to Dermatology  3. Rash Possible eczema like rash versus psoriasis.  Recommend moisturizing cream such as Vaseline or Aquaphor, he could trial steroid cream if needed and establish with dermatology for further care.  Picture placed in chart for referral. - Ambulatory referral to Dermatology  4. Prostate cancer screening Discussed benefits versus risk of checking PSA after age 78 in males.  Patient verbalized understanding.  He is asymptomatic.  PSA requested and to be drawn with his lab work today - PSA  5. Essential hypertension Stable and well-controlled.  Continue on current regimen.  Will check BMP with lab work today. - Basic metabolic panel with GFR  6. Plantar fasciitis of right foot Discussed proper footwear and avoidance of going barefoot.  Recommend arch supports for patient's shoes, ice to the heel as needed, and stretching provided.  Reach out to PCP if pain is worsening or no improvement with conservative measures.  No orders of the defined types were placed in this encounter.  Lab Orders         Hemoglobin A1c         Basic metabolic panel with GFR         PSA     No images are attached to the encounter or orders placed in the encounter.  Return in about 6 months (around 12/20/2023) for ANNUAL PHYSICAL, DIABETES CHECK UP.    Patient to reach out to office if new, worrisome, or unresolved symptoms arise or if no improvement in patient's condition. Patient verbalized understanding and is agreeable to treatment plan. All questions answered to patient's satisfaction.    Nonda Bays, [place]

## 2023-06-20 NOTE — Patient Instructions (Signed)
 Prevnar 20

## 2023-06-21 LAB — BASIC METABOLIC PANEL WITH GFR
BUN/Creatinine Ratio: 18 (ref 10–24)
BUN: 15 mg/dL (ref 8–27)
CO2: 21 mmol/L (ref 20–29)
Calcium: 9.8 mg/dL (ref 8.6–10.2)
Chloride: 99 mmol/L (ref 96–106)
Creatinine, Ser: 0.84 mg/dL (ref 0.76–1.27)
Glucose: 319 mg/dL — ABNORMAL HIGH (ref 70–99)
Potassium: 4.6 mmol/L (ref 3.5–5.2)
Sodium: 136 mmol/L (ref 134–144)
eGFR: 93 mL/min/{1.73_m2} (ref >59)

## 2023-06-21 LAB — HEMOGLOBIN A1C
Est. average glucose Bld gHb Est-mCnc: 243 mg/dL
Hgb A1c MFr Bld: 10.1 % — ABNORMAL HIGH (ref 4.8–5.6)

## 2023-06-21 LAB — PSA: Prostate Specific Ag, Serum: 1.6 ng/mL (ref 0.0–4.0)

## 2023-06-23 ENCOUNTER — Ambulatory Visit (HOSPITAL_BASED_OUTPATIENT_CLINIC_OR_DEPARTMENT_OTHER): Payer: Self-pay | Admitting: Family Medicine

## 2023-06-23 NOTE — Progress Notes (Signed)
 Hi Phillip [name],  Unfortunately your A1c has increased significantly and indicates your diabetes is uncontrolled and on average your sugar is around 250-350 daily. If we do not make medication changes currently, I am afraid of the consequences of uncontrolled diabetes that may happen. We can continue your metformin , but I would like you to start a medication called ozempic or mounjaro. This is called a GLP 1 medication and is a once weekly injection. There is a daily oral form called Rybelsus. This does slow absorption in the gut and increased production of insulin  in the pancreas. This has a significant weight loss benefits and protection to your kidneys and heart. The most commonly reported side effects are nausea and constipation when starting the medication.  The alternative would be medication called Jardiance or Farxiga. If you are agreeable and have a preference, please let me know.

## 2023-07-08 ENCOUNTER — Other Ambulatory Visit (HOSPITAL_BASED_OUTPATIENT_CLINIC_OR_DEPARTMENT_OTHER): Payer: Self-pay | Admitting: Family Medicine

## 2023-07-08 NOTE — Progress Notes (Signed)
far 

## 2023-07-09 MED ORDER — BLOOD GLUCOSE TEST VI STRP: 1.0000 | ORAL_STRIP | Freq: Three times a day (TID) | 0 refills | Status: DC

## 2023-07-09 MED ORDER — EMPAGLIFLOZIN 10 MG PO TABS: 10.0000 mg | ORAL_TABLET | Freq: Every day | ORAL | 3 refills | Status: DC

## 2023-07-09 MED ORDER — LANCET DEVICE MISC: 1.0000 | Freq: Three times a day (TID) | 0 refills | Status: DC

## 2023-07-09 MED ORDER — LANCETS MISC. MISC: 1.0000 | Freq: Three times a day (TID) | 0 refills | Status: DC

## 2023-07-09 MED ORDER — BLOOD GLUCOSE MONITORING SUPPL DEVI: 1.0000 | Freq: Three times a day (TID) | 0 refills | Status: DC

## 2023-07-09 NOTE — Telephone Encounter (Signed)
 Mychart sent by pt asking what supplies is recommended to monitor sugars so it seems like patient does not currently check his levels at home. Since patient needs to check sugars at home, blood glucose kit (meter, lancets, strips) has been sent to pharmacy.

## 2023-07-09 NOTE — Addendum Note (Signed)
 Addended by: RONNETTE DAMIEN SQUIBB on: 07/09/2023 04:01 PM   Modules accepted: Orders

## 2023-07-10 ENCOUNTER — Other Ambulatory Visit (HOSPITAL_BASED_OUTPATIENT_CLINIC_OR_DEPARTMENT_OTHER): Payer: Self-pay | Admitting: Family Medicine

## 2023-07-10 MED ORDER — BLOOD GLUCOSE MONITORING SUPPL DEVI: 1.0000 | Freq: Three times a day (TID) | 0 refills | Status: DC

## 2023-07-10 MED ORDER — BLOOD GLUCOSE TEST VI STRP: 1.0000 | ORAL_STRIP | Freq: Three times a day (TID) | 3 refills | Status: AC

## 2023-07-10 MED ORDER — LANCETS MISC. MISC: 1.0000 | Freq: Three times a day (TID) | 0 refills | Status: AC

## 2023-07-10 MED ORDER — LANCET DEVICE MISC: 1.0000 | Freq: Three times a day (TID) | 0 refills | Status: AC

## 2023-07-10 MED ORDER — EMPAGLIFLOZIN 10 MG PO TABS: 10.0000 mg | ORAL_TABLET | Freq: Every day | ORAL | 3 refills | Status: AC

## 2023-07-15 ENCOUNTER — Other Ambulatory Visit (HOSPITAL_BASED_OUTPATIENT_CLINIC_OR_DEPARTMENT_OTHER): Payer: Self-pay | Admitting: Family Medicine

## 2023-07-15 MED ORDER — FREESTYLE LIBRE 14 DAY SENSOR MISC: 1.0000 | 11 refills | Status: DC

## 2023-07-23 ENCOUNTER — Encounter (HOSPITAL_BASED_OUTPATIENT_CLINIC_OR_DEPARTMENT_OTHER): Payer: Self-pay | Admitting: Family

## 2023-07-23 ENCOUNTER — Ambulatory Visit (HOSPITAL_BASED_OUTPATIENT_CLINIC_OR_DEPARTMENT_OTHER): Admitting: Family

## 2023-07-23 VITALS — BP 114/72 | HR 96 | Ht 67.0 in | Wt 206.0 lb

## 2023-07-23 DIAGNOSIS — I25118 Atherosclerotic heart disease of native coronary artery with other forms of angina pectoris: Secondary | ICD-10-CM | POA: Diagnosis not present

## 2023-07-23 DIAGNOSIS — E785 Hyperlipidemia, unspecified: Secondary | ICD-10-CM | POA: Diagnosis not present

## 2023-07-23 DIAGNOSIS — I10 Essential (primary) hypertension: Secondary | ICD-10-CM | POA: Diagnosis not present

## 2023-07-23 NOTE — Progress Notes (Signed)
 Cardiology Office Note:  .   Date:  07/23/2023  ID:  Phillip [name], DOB May 08, 1950, MRN [id number] PCP: Knute Thersia Bitters, FNP  [facility] Providers Cardiologist:  Annabella Scarce, MD    History of Present Illness: Phillip [name]   Bradie Lacock is a 73 y.o. male with hx of CAD s/p CABG (LIMA-LAD, SVG-OM, RI, PDA), moderate aortic stenosis, HTN, HLD, mild ascending aortic aneurysm, DM2.  Initial evaluation 04/2019 for second opinion of coronary artery disease. Abnormal stress echo at [facility] 05/25/19. Workup at [facility] with LHC revealed 3 vessel obstructive CAD. Echo with LVEF 50%, mild global hypkinesis, normal RV function, mild to moderate AS. He was referred to Dr. Obadiah an underwent CABG 06/2019. Repeat echo 09/2020 LVEF 55%, gr2DD, moderate AS with mean gradient .   Seen 08/20/2022 with BP elevated clinic but overall mildly elevated in the 130s at home.  He was to make lifestyle changes. Updated echo 09/14/2022 low normal LVEF 50 to 50%, distal septal hypokinesis, RV normal, moderate aortic stenosis with mean gradient 25.4 mmHg and mild dilation of ascending aorta 39 mm.  At last visit 01/08/23 BP was well controlled by home readings and current medications continued.   Presents today for follow up independently. Has lost 6 lbs over the last month through making significant dietary change focusing on lean proteins, salad, fresh vegetables and fruits. He has cut out carbohydrates. Hopeful to get back to playing tennis after his plantar fascitis heals. Has not been checking BP routinely at home.   ROS: Please see the history of present illness.    All other systems reviewed and are negative.   Studies Reviewed: .        Cardiac Studies & Procedures   ______________________________________________________________________________________________     ECHOCARDIOGRAM  ECHOCARDIOGRAM COMPLETE 09/14/2022  Narrative ECHOCARDIOGRAM REPORT    Patient Name:   Phillip [name]  Date of Exam: 09/14/2022 Medical Rec #:  [id number]      Height:       67.0 in Accession #:    [id number]     Weight:       219.0 lb Date of Birth:  09-Dec-1950      BSA:          2.102 m Patient Age:    72 years       BP:           138/83 mmHg Patient Gender: M              HR:           58 bpm. Exam Location:  Outpatient  Procedure: 2D Echo, 3D Echo, Cardiac Doppler and Color Doppler  Indications:    I35.2 Nonrheumatic aortic (valve) stenosis with insufficiency  History:        Patient has prior history of Echocardiogram examinations, most recent 09/19/2020. CAD, Prior CABG, Aortic Valve Disease; Risk Factors:Hypertension, Family History of Coronary Artery Disease, Diabetes, Dyslipidemia and Former Smoker. Patient denies chest pain, SOB and leg edema.  Sonographer:    Annabella Cater RVT, RDCS (AE), RDMS Referring Phys: [id number] Dmetrius Ambs S Datra Clary  IMPRESSIONS   1. Distal septal hypokinesis . Left ventricular ejection fraction, by estimation, is 50 to 55%. The left ventricle has low normal function. The left ventricle has no regional wall motion abnormalities. Left ventricular diastolic parameters were normal. 2. Right ventricular systolic function is normal. The right ventricular size is normal. 3. Left atrial size was moderately dilated. 4. The mitral valve is abnormal. Trivial  mitral valve regurgitation. No evidence of mitral stenosis. 5. The aortic valve is tricuspid. There is moderate calcification of the aortic valve. There is moderate thickening of the aortic valve. Aortic valve regurgitation is mild. Moderate aortic valve stenosis. 6. Aortic dilatation noted. There is mild dilatation of the ascending aorta, measuring 39 mm. 7. The inferior vena cava is normal in size with greater than 50% respiratory variability, suggesting right atrial pressure of 3 mmHg.  Comparison(s): EF 55%, moderate AS, mean 21, peak 36.7 mmHg, asc aorta 41 mm.  FINDINGS Left Ventricle: Distal septal  hypokinesis. Left ventricular ejection fraction, by estimation, is 50 to 55%. The left ventricle has low normal function. The left ventricle has no regional wall motion abnormalities. The left ventricular internal cavity size was normal in size. There is no left ventricular hypertrophy. Left ventricular diastolic parameters were normal.  Right Ventricle: The right ventricular size is normal. No increase in right ventricular wall thickness. Right ventricular systolic function is normal.  Left Atrium: Left atrial size was moderately dilated.  Right Atrium: Right atrial size was normal in size.  Pericardium: There is no evidence of pericardial effusion.  Mitral Valve: The mitral valve is abnormal. There is mild thickening of the mitral valve leaflet(s). Trivial mitral valve regurgitation. No evidence of mitral valve stenosis.  Tricuspid Valve: The tricuspid valve is normal in structure. Tricuspid valve regurgitation is not demonstrated. No evidence of tricuspid stenosis.  Aortic Valve: The aortic valve is tricuspid. There is moderate calcification of the aortic valve. There is moderate thickening of the aortic valve. Aortic valve regurgitation is mild. Aortic regurgitation PHT measures 754 msec. Moderate aortic stenosis is present. Aortic valve mean gradient measures 25.4 mmHg. Aortic valve peak gradient measures 44.1 mmHg. Aortic valve area, by VTI measures 1.24 cm.  Pulmonic Valve: The pulmonic valve was normal in structure. Pulmonic valve regurgitation is trivial. No evidence of pulmonic stenosis.  Aorta: Aortic dilatation noted. There is mild dilatation of the ascending aorta, measuring 39 mm.  Venous: The inferior vena cava is normal in size with greater than 50% respiratory variability, suggesting right atrial pressure of 3 mmHg.  IAS/Shunts: No atrial level shunt detected by color flow Doppler.   LEFT VENTRICLE PLAX 2D LVIDd:         4.56 cm     Diastology LVIDs:         3.02 cm      LV e' medial:    4.90 cm/s LV PW:         1.08 cm     LV E/e' medial:  14.8 LV IVS:        0.93 cm     LV e' lateral:   8.33 cm/s LVOT diam:     2.40 cm     LV E/e' lateral: 8.7 LV SV:         102 LV SV Index:   48 LVOT Area:     4.52 cm  3D Volume EF: LV Volumes (MOD)           3D EF:        63 % LV vol d, MOD A4C: 86.9 ml LV EDV:       112 ml LV vol s, MOD A4C: 32.2 ml LV ESV:       42 ml LV SV MOD A4C:     86.9 ml LV SV:        70 ml  RIGHT VENTRICLE RV S prime:  8.50 cm/s TAPSE (M-mode): 1.6 cm  LEFT ATRIUM             Index        RIGHT ATRIUM           Index LA diam:        4.80 cm 2.28 cm/m   RA Area:     16.50 cm LA Vol (A2C):   78.1 ml 37.16 ml/m  RA Volume:   38.90 ml  18.51 ml/m LA Vol (A4C):   97.8 ml 46.53 ml/m LA Biplane Vol: 85.9 ml 40.87 ml/m AORTIC VALVE                     PULMONIC VALVE AV Area (Vmax):    1.30 cm      PV Vmax:          1.25 m/s AV Area (Vmean):   1.17 cm      PV Peak grad:     6.3 mmHg AV Area (VTI):     1.24 cm      PR End Diast Vel: 3.88 msec AV Vmax:           332.00 cm/s AV Vmean:          237.200 cm/s AV VTI:            0.823 m AV Peak Grad:      44.1 mmHg AV Mean Grad:      25.4 mmHg LVOT Vmax:         95.10 cm/s LVOT Vmean:        61.200 cm/s LVOT VTI:          0.225 m LVOT/AV VTI ratio: 0.27 AI PHT:            754 msec  AORTA Ao Root diam: 3.40 cm Ao Asc diam:  3.90 cm Ao Arch diam: 3.3 cm  MITRAL VALVE               TRICUSPID VALVE MV Area (PHT): 4.57 cm    TR Peak grad:   7.3 mmHg MV Decel Time: 166 msec    TR Vmax:        135.00 cm/s MV E velocity: 72.70 cm/s MV A velocity: 55.40 cm/s  SHUNTS MV E/A ratio:  1.31        Systemic VTI:  0.22 m Systemic Diam: 2.40 cm  Maude Emmer MD Electronically signed by Maude Emmer MD Signature Date/Time: 09/14/2022/9:05:32 AM    Final   TEE  ECHO INTRAOPERATIVE TEE 06/30/2019  Interpretation Summary   Left ventricle: Normal cavity size and wall thickness.  Wall motion is abnormal.   Left atrium: Left atrial appendage filling and emptying velocities are normal.   Aortic valve: Moderate valve thickening present. Mild valve calcification present. Mild stenosis. Mild regurgitation with a centrally directed jet.   Mitral valve:  Mild mitral annular calcification. Mild regurgitation. The annulus is mildy.   Right ventricle:  Normal ejection fraction.   Tricuspid valve: Mild regurgitation. The tricuspid valve regurgitation jet is central.        ______________________________________________________________________________________________      Risk Assessment/Calculations:             Physical Exam:   VS:  BP 114/72 (BP Location: Left Arm, Patient Position: Sitting, Cuff Size: Normal)   Pulse 96   Ht 5' 7 (1.702 m)   Wt 206 lb (93.4 kg)   SpO2 97%   BMI 32.26 kg/m  Wt Readings from Last 3 Encounters:  07/23/23 206 lb (93.4 kg)  06/20/23 212 lb (96.2 kg)  03/21/23 215 lb 8 oz (97.8 kg)     Vitals:   07/23/23 0807  BP: 114/72  Pulse: 96  Height: 5' 7 (1.702 m)  Weight: 206 lb (93.4 kg)  SpO2: 97%  BMI (Calculated): 32.26   GEN: Well nourished, well developed in no acute distress NECK: No JVD; No carotid bruits CARDIAC: RRR, gr 2/6 murmur, no rubs, gallops RESPIRATORY:  Clear to auscultation without rales, wheezing or rhonchi  ABDOMEN: Soft, non-tender, non-distended EXTREMITIES:  No edema; No deformity   ASSESSMENT AND PLAN: .    CAD - Stable with no anginal symptoms. No indication for ischemic evaluation.  GDMT aspirin  81mg  daily, Coreg  12.5mg  BID, Crestor  40mg  daily. Recommend aiming for 150 minutes of moderate intensity activity per week and following a heart healthy diet.    Moderate AS -stable with echo 09/2022.  Repeat echo 09/2023 already ordered.  Continue optimal blood pressure control.  Whitecoat hypertension superimposed upon HTN - BP controlled by clinid readings, continue Coreg  12.5mg  BID. We discussed if BP  persistently <110 or symptomatic with lightheadedness could reduce dose Coreg  in the future.  HLD - 08/14/22 total cholesterol 108, triglycerides 191, HDL 52, LDL 26, normal liver enzymes. Continue Rosuvastatin  40mg  daily.   DM2 - 11/2020 A1c 6.2 ? 10/2022 A1c 8.9 ? 06/2023 A1c 1.091. has made significant dietary change. Continue Metformin  500mg  BID, Jardiance  10mg  daily. Congratulated on weight loss through lifestyle changes.     Dispo: follow up in 6 months  Signed, Reche GORMAN Finder, NP

## 2023-07-23 NOTE — Patient Instructions (Signed)
 Medication Instructions:  Your physician recommends that you continue on your current medications as directed. Please refer to the Current Medication list given to you today.   *If you need a refill on your cardiac medications before your next appointment, please call your pharmacy*  Lab Work: NONE  Testing/Procedures: NONE  Follow-Up: At [facility], you and your health needs are our priority.  As part of our continuing mission to provide you with exceptional heart care, our providers are all part of one team.  This team includes your primary Cardiologist (physician) and Advanced Practice Providers or APPs (Physician Assistants and Nurse Practitioners) who all work together to provide you with the care you need, when you need it.  Your next appointment:   6 month(s)  Provider:   Annabella Scarce, MD or Reche Finder, NP   We recommend signing up for the patient portal called MyChart.  Sign up information is provided on this After Visit Summary.  MyChart is used to connect with patients for Virtual Visits (Telemedicine).  Patients are able to view lab/test results, encounter notes, upcoming appointments, etc.  Non-urgent messages can be sent to your provider as well.   To learn more about what you can do with MyChart, go to [email or web address].   Other Instructions IF YOUR BLOOD PRESSURE IS CONSISTENTLY BELOW 110 CALL OR SEND MYCHART MESSAGE

## 2023-07-30 ENCOUNTER — Encounter (HOSPITAL_BASED_OUTPATIENT_CLINIC_OR_DEPARTMENT_OTHER): Payer: Self-pay

## 2023-07-30 ENCOUNTER — Other Ambulatory Visit (HOSPITAL_BASED_OUTPATIENT_CLINIC_OR_DEPARTMENT_OTHER): Payer: Self-pay | Admitting: Family Medicine

## 2023-07-30 DIAGNOSIS — I10 Essential (primary) hypertension: Secondary | ICD-10-CM

## 2023-07-30 DIAGNOSIS — E785 Hyperlipidemia, unspecified: Secondary | ICD-10-CM

## 2023-07-30 DIAGNOSIS — I25118 Atherosclerotic heart disease of native coronary artery with other forms of angina pectoris: Secondary | ICD-10-CM

## 2023-07-30 MED ORDER — FREESTYLE LIBRE 3 READER DEVI: 1 refills | Status: DC

## 2023-07-30 MED ORDER — CARVEDILOL 12.5 MG PO TABS: 6.2500 mg | ORAL_TABLET | Freq: Two times a day (BID) | ORAL | Status: DC

## 2023-08-05 ENCOUNTER — Other Ambulatory Visit (HOSPITAL_BASED_OUTPATIENT_CLINIC_OR_DEPARTMENT_OTHER): Payer: Self-pay | Admitting: Family Medicine

## 2023-08-05 MED ORDER — FREESTYLE LIBRE 3 PLUS SENSOR MISC: 11 refills | Status: DC

## 2023-08-10 ENCOUNTER — Other Ambulatory Visit (HOSPITAL_BASED_OUTPATIENT_CLINIC_OR_DEPARTMENT_OTHER): Payer: Self-pay | Admitting: Family Medicine

## 2023-08-12 DIAGNOSIS — S61001A Unspecified open wound of right thumb without damage to nail, initial encounter: Secondary | ICD-10-CM | POA: Diagnosis not present

## 2023-08-13 ENCOUNTER — Encounter (HOSPITAL_BASED_OUTPATIENT_CLINIC_OR_DEPARTMENT_OTHER): Payer: Self-pay | Admitting: *Deleted

## 2023-08-28 ENCOUNTER — Ambulatory Visit (INDEPENDENT_AMBULATORY_CARE_PROVIDER_SITE_OTHER): Admitting: Family Medicine

## 2023-08-28 ENCOUNTER — Encounter (HOSPITAL_BASED_OUTPATIENT_CLINIC_OR_DEPARTMENT_OTHER): Payer: Self-pay | Admitting: Family Medicine

## 2023-08-28 VITALS — BP 128/74 | HR 69 | Temp 98.1°F | Ht 67.0 in | Wt 200.6 lb

## 2023-08-28 DIAGNOSIS — Z7984 Long term (current) use of oral hypoglycemic drugs: Secondary | ICD-10-CM | POA: Diagnosis not present

## 2023-08-28 DIAGNOSIS — E1165 Type 2 diabetes mellitus with hyperglycemia: Secondary | ICD-10-CM

## 2023-08-28 DIAGNOSIS — S61209D Unspecified open wound of unspecified finger without damage to nail, subsequent encounter: Secondary | ICD-10-CM | POA: Diagnosis not present

## 2023-08-28 MED ORDER — METFORMIN HCL 500 MG PO TABS: 500.0000 mg | ORAL_TABLET | Freq: Every day | ORAL | Status: DC

## 2023-08-28 NOTE — Patient Instructions (Addendum)
 Come back for labs after Sept 12    Metformin : You can cut down to 1 tablet daily. If after 2 weeks your glucose is in the range of 120-130, you can stop metformin .

## 2023-08-28 NOTE — Progress Notes (Signed)
 Subjective:   Phillip [name] January 15, 1950 08/28/2023  Chief Complaint  Patient presents with   Follow-up    Patient states he's here today to follow up with his diabetes and he recent loss 15 pounds. Pt states he has a cut on his right thumb, that he needs to be looked at.     Discussed the use of AI scribe software for clinical note transcription with the patient, who gave verbal consent to proceed.  History of Present Illness      HPI: Phillip [name] presents today for re-assessment and management of chronic medical conditions.   DIABETES MELLITUS: Phillip [name] presents for the medical management of diabetes. He has lost 15lbs in the past 2-3 months with dietary changes and is using Freestyle Libre CGM.  Current diabetes medication regimen: Jardiance  10mg , Metformin  500mg  BID Patient is  adhering to a diabetic diet.  Patient is  exercising regularly.  Patient is  checking BS regularly. Avg: 130 Patient is  checking their feet regularly.  Denies polydipsia, polyphagia, polyuria, open wounds or ulcers on feet.   Lab Results  Component Value Date   HGBA1C 10.1 (H) 06/20/2023    Foot Exam: 03/21/2023 Lab Results  Component Value Date   MICROALBUR 135.2 10/25/2022    Wt Readings from Last 3 Encounters:  08/28/23 200 lb 9.6 oz (91 kg)  07/23/23 206 lb (93.4 kg)  06/20/23 212 lb (96.2 kg)    Time in Target: 98% Above : 2%  Below: 0%    Avulsion First Thumb Right:  Patient had an avulsion injury to the right thumb on 08/12/2023 with a mandolin slicer. He went to [facility] and had cauterization and wound dressing applied. He did not require sutures. He states area is healing well without complications, bleeding or drainage. Last Tdap was January 2020.   The following portions of the patient's history were reviewed and updated as appropriate: past medical history, past surgical history, family history, social history, allergies, medications, and problem list.   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   Coronary artery disease 06/30/2019   CAD in native artery 06/09/2019   Essential hypertension 06/09/2019   Pure hypercholesterolemia 06/09/2019   Type 2 diabetes mellitus with obesity (HCC) 06/09/2019   Hyperlipidemia 06/09/2019   Positive cardiac stress test 05/26/2019   Diastasis recti 04/13/2019   Umbilical hernia with obstruction, without gangrene 04/13/2019   Family history of coronary artery disease 11/06/2016   Hemorrhoids 01/21/2012   Melanoma of skin (HCC) 03/07/2011   Past Medical History:  Diagnosis Date   Aortic stenosis    mild to moderate AS 05/2019 echo    CAD in native artery 06/09/2019   Cataract    Diabetes mellitus type 2 in obese 06/09/2019   Elevated hemoglobin A1c    Essential hypertension 06/09/2019   Heart murmur    History of colonoscopy 01/08/2009   History of high cholesterol    Melanoma (HCC)    back   Pure hypercholesterolemia 06/09/2019   Past Surgical History:  Procedure Laterality Date   CARDIAC SURGERY  06/30/2019   CATARACT EXTRACTION W/PHACO Left 09/01/2019   Procedure: CATARACT EXTRACTION PHACO  AND INTRAOCULAR LENS PLACEMENT (IOC) LEFT 6.30  00:36.6;  Surgeon: Jaye Fallow, MD;  Location: [facility];  Service: Ophthalmology;  Laterality: Left;   CATARACT EXTRACTION W/PHACO Right 09/29/2019   Procedure: CATARACT EXTRACTION PHACO AND INTRAOCULAR LENS PLACEMENT (IOC) RIGHT DIABETIC 5.06 00:35.6;  Surgeon: Jaye Fallow, MD;  Location: [facility];  Service: Ophthalmology;  Laterality: Right;  Diabetic - oral meds   COLONOSCOPY  2003   COLONOSCOPY  01/08/2001   CORONARY  ARTERY BYPASS GRAFT N/A 06/30/2019   Procedure: CORONARY ARTERY BYPASS GRAFTING (CABG) x 4, LIMA TO DIAGONAL 1, SVG TO OML, SVG TO RAMUS, SVG TO PDA;  Surgeon: Fleeta Ochoa, Maude, MD;  Location: [facility] OR;  Service: Open Heart Surgery;  Laterality: N/A;   ENDOVEIN HARVEST OF GREATER SAPHENOUS VEIN Bilateral 06/30/2019   Procedure: ENDOVEIN HARVEST OF GREATER SAPHENOUS VEIN;  Surgeon: Fleeta Ochoa Maude, MD;  Location: [facility] OR;  Service: Open Heart Surgery;  Laterality: Bilateral;   EYE SURGERY     MELANOMA EXCISION  01/09/2004   back   TEE WITHOUT CARDIOVERSION N/A 06/30/2019   Procedure: TRANSESOPHAGEAL ECHOCARDIOGRAM (TEE);  Surgeon: Fleeta Ochoa, Maude, MD;  Location: [facility] OR;  Service: Open Heart Surgery;  Laterality: N/A;   THYROGLOSSAL DUCT CYST  01/09/1999   Family History  Problem Relation Age of Onset   CAD Father    Valvular heart disease Father    Colon cancer Father    Cancer Father    Heart disease Maternal Uncle    Heart disease Maternal Grandfather    Kidney failure Mother    Diabetes Mother    Heart disease Mother    Outpatient Medications Prior to Visit  Medication Sig Dispense Refill   aspirin  EC 81 MG tablet Take 81 mg by mouth daily. Swallow whole.     carvedilol  (COREG ) 12.5 MG tablet Take 0.5 tablets (6.25 mg total) by mouth 2 (two) times daily with a meal.     Continuous Glucose Receiver (FREESTYLE LIBRE 3 READER) DEVI Use device with CGM sensor to check glucose three times daily for diabetes management. 1 each 1   empagliflozin  (JARDIANCE ) 10 MG TABS tablet Take 1 tablet (10 mg total) by mouth daily. 60 tablet 3   Multiple Vitamins-Minerals (CENTRUM SILVER PO) Take by mouth daily.     rosuvastatin  (CRESTOR ) 40 MG tablet Take 1 tablet (40 mg total) by mouth at bedtime. 90 tablet 3   metFORMIN  (GLUCOPHAGE ) 500 MG tablet Take 1 tablet (500 mg total) by mouth 2 (two) times daily with a meal. 180 tablet 3   Blood Glucose Monitoring Suppl DEVI 1 each by Does not apply route in the  morning, at noon, and at bedtime. May substitute to any manufacturer covered by patient's insurance. 1 each 0   Continuous Glucose Sensor (FREESTYLE LIBRE 14 DAY SENSOR) MISC USE AS DIRECTED EVERY 14 DAYS 2 each 11   No facility-administered medications prior to visit.   No Known Allergies   ROS: A complete ROS was performed with pertinent positives/negatives noted in the HPI. The remainder of the ROS are negative.    Objective:   Today's Vitals   08/28/23 0801  BP: 128/74  Pulse: 69  Temp: 98.1 F (36.7 C)  SpO2: 99%  Weight: 200 lb 9.6 oz (91 kg)  Height: 5' 7 (1.702 m)    Physical Exam   GENERAL: Well-appearing, in NAD. Well nourished.  SKIN: Pink, warm and dry. Well healed wound without drainage, redness present to right lateral  thumb.  Head: Normocephalic. NECK: Trachea midline. Full ROM w/o pain or tenderness.  RESPIRATORY: Chest wall symmetrical. Respirations even and non-labored.  MSK: Muscle tone and strength appropriate for age.  NEUROLOGIC: No motor or sensory deficits. Steady, even gait. C2-C12 intact.  PSYCH/MENTAL STATUS: Alert, oriented x 3. Cooperative, appropriate mood and affect.   Health Maintenance Due  Topic Date Due   Medicare Annual Wellness (AWV)  01/25/2019   COVID-19 Vaccine (2 - Janssen risk series) 05/16/2019   INFLUENZA VACCINE  08/09/2023    No results found for any visits on 08/28/23.  The ASCVD Risk score (Arnett DK, et al., 2019) failed to calculate for the following reasons:   The valid total cholesterol range is 130 to 320 mg/dL     Assessment & Plan:  1. Type 2 diabetes mellitus with hyperglycemia, without long-term current use of insulin  (HCC) (Primary) CGM shows average glucose of 130 and in target range 98%. Patietn congratulated on improved control and weight loss. Will return in Sept for labs only. Will continue Jardiance  10mg  and decrease Metformin  to 500mg  once daily. Continue diet control.  - Basic metabolic panel with  GFR; Future - Hemoglobin A1c; Future  2. Avulsion of finger, subsequent encounter Well healed. No signs of infection. Recommend updating Tdap since it had been over 5 years since last given, patient declined today. Will return if signs of infection or inflammation occur.     Meds ordered this encounter  Medications   metFORMIN  (GLUCOPHAGE ) 500 MG tablet    Sig: Take 1 tablet (500 mg total) by mouth daily with breakfast.    Supervising Provider:   DE PERU, RAYMOND J [[id number]]   Lab Orders         Basic metabolic panel with GFR         Hemoglobin A1c      Return in about 4 months (around 12/28/2023) for ANNUAL PHYSICAL, MWV, DIABETES CHECK UP.    Patient to reach out to office if new, worrisome, or unresolved symptoms arise or if no improvement in patient's condition. Patient verbalized understanding and is agreeable to treatment plan. All questions answered to patient's satisfaction.    Thersia Schuyler Stark, [place]

## 2023-09-02 ENCOUNTER — Encounter (HOSPITAL_BASED_OUTPATIENT_CLINIC_OR_DEPARTMENT_OTHER): Payer: Self-pay

## 2023-09-02 DIAGNOSIS — I25118 Atherosclerotic heart disease of native coronary artery with other forms of angina pectoris: Secondary | ICD-10-CM

## 2023-09-02 DIAGNOSIS — E785 Hyperlipidemia, unspecified: Secondary | ICD-10-CM

## 2023-09-02 DIAGNOSIS — I10 Essential (primary) hypertension: Secondary | ICD-10-CM

## 2023-09-02 MED ORDER — CARVEDILOL 6.25 MG PO TABS: 6.2500 mg | ORAL_TABLET | Freq: Two times a day (BID) | ORAL | 3 refills | Status: AC

## 2023-09-02 NOTE — Telephone Encounter (Signed)
 Noted. Appreciate nursing team assistance.   Phillip Cortese S Kamillah Didonato, NP

## 2023-09-04 ENCOUNTER — Ambulatory Visit (INDEPENDENT_AMBULATORY_CARE_PROVIDER_SITE_OTHER): Admitting: Physician Assistant

## 2023-09-04 ENCOUNTER — Encounter: Payer: Self-pay | Admitting: Physician Assistant

## 2023-09-04 VITALS — BP 125/83 | HR 71

## 2023-09-04 DIAGNOSIS — Z85820 Personal history of malignant melanoma of skin: Secondary | ICD-10-CM

## 2023-09-04 DIAGNOSIS — C439 Malignant melanoma of skin, unspecified: Secondary | ICD-10-CM

## 2023-09-04 DIAGNOSIS — D492 Neoplasm of unspecified behavior of bone, soft tissue, and skin: Secondary | ICD-10-CM | POA: Diagnosis not present

## 2023-09-04 DIAGNOSIS — L578 Other skin changes due to chronic exposure to nonionizing radiation: Secondary | ICD-10-CM | POA: Diagnosis not present

## 2023-09-04 DIAGNOSIS — D229 Melanocytic nevi, unspecified: Secondary | ICD-10-CM

## 2023-09-04 DIAGNOSIS — L409 Psoriasis, unspecified: Secondary | ICD-10-CM | POA: Diagnosis not present

## 2023-09-04 DIAGNOSIS — W908XXA Exposure to other nonionizing radiation, initial encounter: Secondary | ICD-10-CM

## 2023-09-04 DIAGNOSIS — D489 Neoplasm of uncertain behavior, unspecified: Secondary | ICD-10-CM

## 2023-09-04 DIAGNOSIS — L570 Actinic keratosis: Secondary | ICD-10-CM | POA: Diagnosis not present

## 2023-09-04 DIAGNOSIS — C4361 Malignant melanoma of right upper limb, including shoulder: Secondary | ICD-10-CM

## 2023-09-04 HISTORY — DX: Malignant melanoma of skin, unspecified: C43.9

## 2023-09-04 MED ORDER — FLUOROURACIL 5 % EX CREA: TOPICAL_CREAM | Freq: Two times a day (BID) | CUTANEOUS | 0 refills | Status: DC

## 2023-09-04 NOTE — Patient Instructions (Addendum)
 Important Information  Due to recent changes in healthcare laws, you may see results of your pathology and/or laboratory studies on MyChart before the doctors have had a chance to review them. We understand that in some cases there may be results that are confusing or concerning to you. Please understand that not all results are received at the same time and often the doctors may need to interpret multiple results in order to provide you with the best plan of care or course of treatment. Therefore, we ask that you please give [place] 2 business days to thoroughly review all your results before contacting the office for clarification. Should we see a critical lab result, you will be contacted sooner.   If You Need Anything After Your Visit  If you have any questions or concerns for your doctor, please call our main line at [phone] If no one answers, please leave a voicemail as directed and we will return your call as soon as possible. Messages left after 4 pm will be answered the following business day.   You may also send [place] a message via MyChart. We typically respond to MyChart messages within 1-2 business days.  For prescription refills, please ask your pharmacy to contact our office. Our fax number is [phone].  If you have an urgent issue when the clinic is closed that cannot wait until the next business day, you can page your doctor at the number below.    Please note that while we do our best to be available for urgent issues outside of office hours, we are not available 24/7.   If you have an urgent issue and are unable to reach [place], you may choose to seek medical care at your doctor's office, retail clinic, urgent care center, or emergency room.  If you have a medical emergency, please immediately call 911 or go to the emergency department. In the event of inclement weather, please call our main line at [phone] for an update on the status of any delays or  closures.  Dermatology Medication Tips: Please keep the boxes that topical medications come in in order to help keep track of the instructions about where and how to use these. Pharmacies typically print the medication instructions only on the boxes and not directly on the medication tubes.   If your medication is too expensive, please contact our office at [phone] or send [place] a message through MyChart.   We are unable to tell what your co-pay for medications will be in advance as this is different depending on your insurance coverage. However, we may be able to find a substitute medication at lower cost or fill out paperwork to get insurance to cover a needed medication.   If a prior authorization is required to get your medication covered by your insurance company, please allow [place] 1-2 business days to complete this process.  Drug prices often vary depending on where the prescription is filled and some pharmacies may offer cheaper prices.  The website www.goodrx.com contains coupons for medications through different pharmacies. The prices here do not account for what the cost may be with help from insurance (it may be cheaper with your insurance), but the website can give you the price if you did not use any insurance.  - You can print the associated coupon and take it with your prescription to the pharmacy.  - You may also stop by our office during regular business hours and pick up a GoodRx coupon card.  - If  you need your prescription sent electronically to a different pharmacy, notify our office through [facility] or by phone at [phone]     Patient Handout: Wound Care for Skin Biopsy Site  Taking Care of Your Skin Biopsy Site  Proper care of the biopsy site is essential for promoting healing and minimizing scarring. This handout provides instructions on how to care for your biopsy site to ensure optimal recovery.  1. Cleaning the Wound:  Clean the biopsy site daily  with gentle soap and water. Gently pat the area dry with a clean, soft towel. Avoid harsh scrubbing or rubbing the area, as this can irritate the skin and delay healing.  2. Applying Aquaphor and Bandage:  After cleaning the wound, apply a thin layer of Aquaphor ointment to the biopsy site. Cover the area with a sterile bandage to protect it from dirt, bacteria, and friction. Change the bandage daily or as needed if it becomes soiled or wet.  3. Continued Care for One Week:  Repeat the cleaning, Aquaphor application, and bandaging process daily for one week following the biopsy procedure. Keeping the wound clean and moist during this initial healing period will help prevent infection and promote optimal healing.  4. Massaging Aquaphor into the Area:  ---After one week, discontinue the use of bandages but continue to apply Aquaphor to the biopsy site. ----Gently massage the Aquaphor into the area using circular motions. ---Massaging the skin helps to promote circulation and prevent the formation of scar tissue.   Additional Tips:  Avoid exposing the biopsy site to direct sunlight during the healing process, as this can cause hyperpigmentation or worsen scarring. If you experience any signs of infection, such as increased redness, swelling, warmth, or drainage from the wound, contact your healthcare provider immediately. Follow any additional instructions provided by your healthcare provider for caring for the biopsy site and managing any discomfort. Conclusion:  Taking proper care of your skin biopsy site is crucial for ensuring optimal healing and minimizing scarring. By following these instructions for cleaning, applying Aquaphor, and massaging the area, you can promote a smooth and successful recovery. If you have any questions or concerns about caring for your biopsy site, don't hesitate to contact your healthcare provider for guidance.

## 2023-09-04 NOTE — Progress Notes (Signed)
 New Patient Visit   Subjective  Phillip [name] is a 73 y.o. male NEW PATIENT who presents for the following: Rash. Has seen dermatology in [facility] as well as [facility] and prescribed triamcinolone. He states that he was not advised of the possible side effects and had skin bruising with everyday use. Pt states he uses Aveeno eczema lotion a few times a week. Pt states he noticed that it happens more when it is cold and less when it's hot.   Other concerns: Does have a history of melanoma (back) many years ago. Last full skin exam was in March of 2025. Is wishing to establish care here and undergo routine skin exams. Has scaly areas on scalp. Usually treated with cryotherapy. Today's visit will focus on his current rash.     The following portions of the chart were reviewed this encounter and updated as appropriate: medications, allergies, medical history  Review of Systems:  No other skin or systemic complaints except as noted in HPI or Assessment and Plan.  Objective  Well appearing patient in no apparent distress; mood and affect are within normal limits.  A near complete skin exam was performed to include lymph nodes.     Relevant exam findings are noted in the Assessment and Plan.  Right Upper Arm - Anterior Right upper arm irregular brown patch   No palpable cervical, axillary or inguinal lymphadenopathy.   Assessment & Plan   HISTORY OF MALIGNANT MELANOMA  - patient to bring pathology report  - schedule a full skin exam for March 2026 - await pathology of today's concerning lesion on right upper arm     MELANOCYTIC NEVI - Brief exam performed today   - Tan-brown and/or pink-flesh-colored symmetric macules and papules - Benign appearing on exam today - Observation - Call clinic for new or changing moles - Recommend daily use of broad spectrum spf 30+ sunscreen to sun-exposed areas.   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  - Staying in the shade or wearing long sleeves, sun glasses (UVA+UVB protection) and wide brim hats (4-inch brim around the entire circumference of the hat) are also recommended for sun protection.  - Plan topical Efudex  as directed to scalp.     PSORIASIS  - MILD  Exam: guttate patches arms, trunk and legs. Denies joint involvement.   Psoriasis is a chronic non-curable, but treatable genetic/hereditary disease that may have other systemic features affecting other organ systems such as joints (Psoriatic Arthritis). It is associated with an increased risk of inflammatory bowel disease, heart disease, non-alcoholic fatty liver disease, and depression.  Treatments include light and laser treatments; topical medications; and systemic medications including oral and injectables.  Treatment Plan: Patient declined as it is not bothersome.    NEOPLASM OF UNCERTAIN BEHAVIOR Right Upper Arm - Anterior Epidermal / dermal shaving  Lesion diameter (cm):  1 Informed consent: discussed and consent obtained   Timeout: patient name, date of birth, surgical site, and procedure verified   Procedure prep:  Patient was prepped and draped in usual sterile fashion Instrument used: DermaBlade   Outcome: patient tolerated procedure well   Post-procedure details: sterile dressing applied   Dressing type: bandage and petrolatum    Specimen 1 - Surgical pathology Differential Diagnosis: DN vs MM  Check Margins:  No AK (ACTINIC KERATOSIS)   Related Medications fluorouracil  (EFUDEX ) 5 % cream Apply topically 2 (two) times daily. Apply to scalp twice a day for one month PSORIASIS   MULTIPLE BENIGN NEVI   HISTORY OF MELANOMA   ACTINIC SKIN DAMAGE    Return in about 7 months (around 04/03/2024) for  TBSE.  I, Doyce Pan, CMA, am acting as scribe for Dishawn Bhargava K, PA-C.   Documentation: I have reviewed the above documentation for accuracy and completeness, and I agree with the above.  Adeliz Tonkinson K, PA-C

## 2023-09-10 ENCOUNTER — Other Ambulatory Visit (HOSPITAL_BASED_OUTPATIENT_CLINIC_OR_DEPARTMENT_OTHER): Payer: Self-pay | Admitting: Family Medicine

## 2023-09-10 ENCOUNTER — Encounter (HOSPITAL_BASED_OUTPATIENT_CLINIC_OR_DEPARTMENT_OTHER): Payer: Self-pay | Admitting: Family Medicine

## 2023-09-10 LAB — SURGICAL PATHOLOGY

## 2023-09-10 NOTE — Telephone Encounter (Signed)
 Please see mychart as an FYI.

## 2023-09-11 ENCOUNTER — Ambulatory Visit: Payer: Self-pay | Admitting: Physician Assistant

## 2023-09-13 ENCOUNTER — Encounter: Payer: Self-pay | Admitting: Physician Assistant

## 2023-09-16 ENCOUNTER — Ambulatory Visit (HOSPITAL_BASED_OUTPATIENT_CLINIC_OR_DEPARTMENT_OTHER): Payer: Self-pay | Admitting: Family

## 2023-09-16 ENCOUNTER — Ambulatory Visit (HOSPITAL_BASED_OUTPATIENT_CLINIC_OR_DEPARTMENT_OTHER): Payer: PPO

## 2023-09-16 DIAGNOSIS — I77810 Thoracic aortic ectasia: Secondary | ICD-10-CM

## 2023-09-16 DIAGNOSIS — I350 Nonrheumatic aortic (valve) stenosis: Secondary | ICD-10-CM | POA: Diagnosis not present

## 2023-09-16 LAB — ECHOCARDIOGRAM COMPLETE
AR max vel: 0.77 cm2
AV Area VTI: 0.76 cm2
AV Area mean vel: 0.74 cm2
AV Mean grad: 31 mmHg
AV Peak grad: 53.6 mmHg
Ao pk vel: 3.66 m/s
Area-P 1/2: 2.69 cm2
S' Lateral: 2.96 cm

## 2023-09-16 NOTE — Telephone Encounter (Signed)
 The patient has been notified of the result and verbalized understanding.  All questions (if any) were answered.  Pt aware we will repeat another echo on him in one year for surveillance.   Pt aware I will place the order for this in the system and send a  message to our echo scheduler to reach out to him closer to that time frame to arrange that appt.   Pt verbalized understanding and agrees with this plan.

## 2023-09-16 NOTE — Telephone Encounter (Signed)
-----   Message from Reche GORMAN Finder sent at 09/16/2023  2:43 PM EDT ----- Echocardiogram with normal heart pumping function. Heart muscle moderately stiff - we prevent this from worsening by keeping blood pressure well controlled. Moderate aortic valve stenosis  (stiffening). Mild dilation ascending aorta 41mm. Slightly progressed from prior. Repeat echocardiogram in 1 year for monitoring.  ----- Message ----- From: Interface, [employer] Sent: 09/16/2023   9:51 AM EDT To: Reche GORMAN Finder, NP

## 2023-09-20 ENCOUNTER — Other Ambulatory Visit: Payer: Self-pay

## 2023-09-20 DIAGNOSIS — E785 Hyperlipidemia, unspecified: Secondary | ICD-10-CM

## 2023-09-20 DIAGNOSIS — I25118 Atherosclerotic heart disease of native coronary artery with other forms of angina pectoris: Secondary | ICD-10-CM

## 2023-09-20 DIAGNOSIS — I10 Essential (primary) hypertension: Secondary | ICD-10-CM

## 2023-09-20 MED ORDER — ROSUVASTATIN CALCIUM 40 MG PO TABS: 40.0000 mg | ORAL_TABLET | Freq: Every day | ORAL | 3 refills | Status: AC

## 2023-10-02 ENCOUNTER — Encounter: Payer: Self-pay | Admitting: Dermatology

## 2023-10-07 NOTE — Telephone Encounter (Signed)
 Please see recent mychart sent by pt and advise.

## 2023-10-08 ENCOUNTER — Encounter: Payer: Self-pay | Admitting: Dermatology

## 2023-10-08 ENCOUNTER — Ambulatory Visit (INDEPENDENT_AMBULATORY_CARE_PROVIDER_SITE_OTHER): Admitting: Dermatology

## 2023-10-08 VITALS — BP 134/79 | HR 71 | Temp 97.9°F

## 2023-10-08 DIAGNOSIS — L814 Other melanin hyperpigmentation: Secondary | ICD-10-CM | POA: Diagnosis not present

## 2023-10-08 DIAGNOSIS — C4361 Malignant melanoma of right upper limb, including shoulder: Secondary | ICD-10-CM

## 2023-10-08 DIAGNOSIS — L578 Other skin changes due to chronic exposure to nonionizing radiation: Secondary | ICD-10-CM

## 2023-10-08 DIAGNOSIS — C439 Malignant melanoma of skin, unspecified: Secondary | ICD-10-CM

## 2023-10-08 MED ORDER — FLUOROURACIL 5 % EX CREA: TOPICAL_CREAM | Freq: Two times a day (BID) | CUTANEOUS | 2 refills | Status: DC

## 2023-10-08 NOTE — Progress Notes (Addendum)
 Follow-Up Visit   Subjective  Phillip [name] is a 73 y.o. male who presents for the following: Mohs of an Invasive Melanoma (0.2 mm) on the right upper arm-anterior, referred by Phillip Like, PA-C.  The following portions of the chart were reviewed this encounter and updated as appropriate: medications, allergies, medical history  Review of Systems:  No other skin or systemic complaints except as noted in HPI or Assessment and Plan.  Objective  Well appearing patient in no apparent distress; mood and affect are within normal limits.  A focused examination was performed of the following areas: Right upper arm-anterior Relevant physical exam findings are noted in the Assessment and Plan.   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 No    Anesthesia: Anesthesia method: local infiltration Local anesthetic: lidocaine  1% WITH epi  Procedure Details: Timeout: pre-procedure verification complete Procedure Prep: patient was prepped and draped in usual sterile fashion Prep type: chlorhexidine  Biopsy accession number: [id number] Pre-Op diagnosis: melanoma Melanoma subtype: invasive Melanoma Breslow depth (mm): 0 MohsAIQ Surgical site (if tumor spans multiple areas, please select predominant area): upper extremity Surgery side: right Surgical site (from skin exam): Right Upper Arm - Anterior Pre-operative length (cm): 1.5 Pre-operative width (cm): 1 Indications for Mohs surgery: anatomic location where tissue conservation is critical and aggressive histology  Micrographic Surgery Details: Post-operative length (cm): 2.6 Post-operative width (cm): 2.5 Number of Mohs stages: 1 Post surgery depth of defect: subcutaneous fat  Stage 1    Tumor features identified on  Mohs section: no tumor identified  Reconstruction: Was the defect reconstructed? Yes   Was reconstruction performed by the same Mohs surgeon? Yes   Setting of reconstruction: outpatient office When was reconstruction performed? same day Type of reconstruction: linear Linear reconstruction: complex  Skin repair Complexity:  Complex Final length (cm):  6 Informed consent: discussed and consent obtained   Timeout: patient name, date of birth, surgical site, and procedure verified   Procedure prep:  Patient was prepped and draped in usual sterile fashion Prep type:  Chlorhexidine  Anesthesia: the lesion was anesthetized in a standard fashion   Anesthetic:  1% lidocaine  w/ epinephrine  1-100,000 buffered w/ 8.4% NaHCO3 Reason for type of repair: reduce tension to allow closure, preserve normal anatomy, preserve normal anatomical and functional relationships, avoid adjacent structures and allow side-to-side closure without requiring a flap or graft   Undermining: area extensively undermined   Subcutaneous layers (deep stitches):  Suture size:  3-0 Suture type: PDS (polydioxanone)   Stitches:  Buried vertical mattress Fine/surface layer approximation (top stitches):  Suture type: cyanoacrylate tissue glue   Hemostasis achieved with: suture, pressure and electrodesiccation Outcome: patient tolerated procedure well with no complications   Post-procedure details: sterile dressing applied and wound care instructions given   Dressing type: bandage and pressure dressing      Return in about 4 weeks (around 11/05/2023) for wound check.  Phillip [name], CMA, am acting as scribe for Phillip CHRISTELLA HOLY, MD.    10/29/2023  HISTORY OF PRESENT ILLNESS  Phillip [name] is seen in consultation at the request of Phillip Like, PA-C for biopsy-proven Invasive Melanoma of the right upper arm. They note that the area has been present for about 2 years increasing in size with time.  There is no history  of  previous treatment.  Reports no other new or changing lesions and has no other complaints today.  Medications and allergies: see patient chart.  Review of systems: Reviewed 8 systems and notable for the above skin cancer.  All other systems reviewed are unremarkable/negative, unless noted in the HPI. Past medical history, surgical history, family history, social history were also reviewed and are noted in the chart/questionnaire.    PHYSICAL EXAMINATION  General: Well-appearing, in no acute distress, alert and oriented x 4. Vitals reviewed in chart (if available).   Skin: Exam reveals a 1.5 x 1.0 cm erythematous papule and biopsy scar on the right upper arm. There are rhytids, telangiectasias, and lentigines, consistent with photodamage. Lymph nodes: No cervical, axillary or inguinal lymphadenopathy.  Biopsy report(s) reviewed, confirming the diagnosis.   ASSESSMENT  1) Invasive Melanoma of the right upper arm 2) photodamage 3) solar lentigines   PLAN   1. Due to location, size, histology, or recurrence and the likelihood of subclinical extension as well as the need to conserve normal surrounding tissue, the patient was deemed acceptable for Mohs micrographic surgery (MMS).  The nature and purpose of the procedure, associated benefits and risks including recurrence and scarring, possible complications such as pain, infection, and bleeding, and alternative methods of treatment if appropriate were discussed with the patient during consent. The lesion location was verified by the patient, by reviewing previous notes, pathology reports, and by photographs as well as angulation measurements if available.  Informed consent was reviewed and signed by the patient, and timeout was performed at 8:15 AM. See op note below.  2. For the photodamage and solar lentigines, sun protection discussed/information given on OTC sunscreens, and we recommend continued regular follow-up with primary dermatologist  every 6 months or sooner for any growing, bleeding, or changing lesions. 3. Prognosis and future surveillance discussed. 4. Letter with treatment outcome sent to referring provider. 5. Pain acetaminophen /ibuprofen   MOHS MICROGRAPHIC SURGERY AND RECONSTRUCTION  Initial size:   1.5 x 1.0 cm Surgical defect/wound size: 2.6 x 2.5 cm Anesthesia:    0.33% lidocaine  with 1:200,000 epinephrine  EBL:    <5 mL Complications:  None Repair type:   Complex SQ suture:   3-0 PDS Cutaneous suture:  Cyanoacrylate and Steristrips Final size of the repair: 6.0 cm  Stages: 1  STAGE I: Anesthesia achieved with 0.5% lidocaine  with 1:200,000 epinephrine . ChloraPrep applied. 5 section(s) excised using Mohs technique (this includes total peripheral and deep tissue margin excision and evaluation with frozen sections, excised and interpreted by the same physician). The tumor was first debulked and then excised with an approx. 2mm margin.  Hemostasis was achieved with electrocautery as needed.  The specimen was then oriented, subdivided/relaxed, inked, and processed using Mohs technique.  Tissue was stained with H&E and MART-1 with 2 chromogens (2 immunostains).   Frozen section analysis revealed a clear deep and peripheral margin.   Reconstruction  The surgical wound was then cleaned, prepped, and re-anesthetized as above. Wound edges were undermined extensively along at least one entire edge and at a distance equal to or greater than the width of the defect (see wound defect size above) in order to achieve closure and decrease wound tension and anatomic distortion. Redundant tissue repair including standing cone removal was performed. Hemostasis was achieved with electrocautery. Subcutaneous and epidermal tissues were approximated with the above sutures. The surgical site was then lightly scrubbed with sterile, saline-soaked gauze. Steri-strips were applied, and the area was then bandaged using Vaseline ointment,  non-adherent gauze, gauze pads, and tape to provide an adequate pressure dressing. The patient tolerated the procedure well, was given detailed written and verbal wound care instructions, and was discharged in good condition.   The patient will follow-up: 4 weeks.    Documentation: I have reviewed the above documentation for accuracy and completeness, and I agree with the above.  Phillip CHRISTELLA HOLY, MD

## 2023-10-08 NOTE — Patient Instructions (Signed)

## 2023-10-08 NOTE — Addendum Note (Signed)
 Addended by: CAMMIE BELT D on: 10/08/2023 04:55 PM   Modules accepted: Orders

## 2023-10-09 ENCOUNTER — Encounter: Payer: Self-pay | Admitting: Physician Assistant

## 2023-10-10 ENCOUNTER — Encounter: Payer: Self-pay | Admitting: Dermatology

## 2023-10-23 ENCOUNTER — Other Ambulatory Visit (HOSPITAL_BASED_OUTPATIENT_CLINIC_OR_DEPARTMENT_OTHER): Payer: Self-pay | Admitting: *Deleted

## 2023-10-23 DIAGNOSIS — E1165 Type 2 diabetes mellitus with hyperglycemia: Secondary | ICD-10-CM

## 2023-10-23 LAB — BASIC METABOLIC PANEL WITH GFR
BUN/Creatinine Ratio: 22 (ref 10–24)
BUN: 22 mg/dL (ref 8–27)
CO2: 22 mmol/L (ref 20–29)
Calcium: 10.3 mg/dL — ABNORMAL HIGH (ref 8.6–10.2)
Chloride: 101 mmol/L (ref 96–106)
Creatinine, Ser: 0.98 mg/dL (ref 0.76–1.27)
Glucose: 126 mg/dL — ABNORMAL HIGH (ref 70–99)
Potassium: 4.6 mmol/L (ref 3.5–5.2)
Sodium: 140 mmol/L (ref 134–144)
eGFR: 82 mL/min/1.73 (ref >59)

## 2023-10-23 LAB — HEMOGLOBIN A1C
Est. average glucose Bld gHb Est-mCnc: 128 mg/dL
Hgb A1c MFr Bld: 6.1 % — ABNORMAL HIGH (ref 4.8–5.6)

## 2023-10-24 ENCOUNTER — Ambulatory Visit (HOSPITAL_BASED_OUTPATIENT_CLINIC_OR_DEPARTMENT_OTHER): Payer: Self-pay | Admitting: Family Medicine

## 2023-10-24 NOTE — Progress Notes (Signed)
 Hi Om, Your A1c is down to 6.1 which is excellent.  I am very proud of all the work that you have been doing to get this under control.  We will continue on the Jardiance  currently.  If you have any questions please let me know

## 2023-10-25 ENCOUNTER — Encounter: Payer: Self-pay | Admitting: Physician Assistant

## 2023-11-04 ENCOUNTER — Ambulatory Visit: Admitting: Dermatology

## 2023-11-04 ENCOUNTER — Encounter: Payer: Self-pay | Admitting: Dermatology

## 2023-11-04 VITALS — BP 137/80 | HR 70

## 2023-11-04 DIAGNOSIS — L905 Scar conditions and fibrosis of skin: Secondary | ICD-10-CM

## 2023-11-04 DIAGNOSIS — C439 Malignant melanoma of skin, unspecified: Secondary | ICD-10-CM

## 2023-11-04 DIAGNOSIS — Z85820 Personal history of malignant melanoma of skin: Secondary | ICD-10-CM | POA: Diagnosis not present

## 2023-11-04 NOTE — Progress Notes (Signed)
   Follow Up Visit   Subjective  Phillip [name] is a 73 y.o. male who presents for the following: follow up from Mohs surgery   The patient presents for follow up from Mohs surgery for a Malignant Melanoma on the right upper arm- anterior, treated on 10/08/2023, repaired with a complex linear closure. The patient has been bandaging the wound as directed. The endorse the following concerns: none.   The following portions of the chart were reviewed this encounter and updated as appropriate: medications, allergies, medical history  Review of Systems:  No other skin or systemic complaints except as noted in HPI or Assessment and Plan.  Objective  Well appearing patient in no apparent distress; mood and affect are within normal limits.  A focal examination was performed including the right upper arm- anterior. All findings within normal limits unless otherwise noted below.  Healing wound with mild erythema  Relevant physical exam findings are noted in the Assessment and Plan.    Assessment & Plan   Scar s/p Mohs for a malignant melanoma on the right upper arm- anterior, treated on 10/08/2023, repaired with a complex linear closure.  - Reassured that wound is healing well - No evidence of infection - No swelling, induration, purulence, dehiscence, or tenderness out of proportion to the clinical exam, see photo above - Discussed that scars take up to 12 months to mature from the date of surgery - Recommend SPF 30+ to scar daily to prevent purple color from UV exposure during scar maturation process - Discussed that erythema and raised appearance of scar will fade over the next 4-6 months - OK to start scar massage at 4-6 weeks post-op - Can consider silicone based products for scar healing starting at 6 weeks post-op - Ok to discontinue ointment daily   HISTORY OF MELANOMA - No evidence of recurrence today - No lymphadenopathy - Recommend regular full body skin exams - Recommend daily  broad spectrum sunscreen SPF 30+ to sun-exposed areas, reapply every 2 hours as needed.  - Call if any new or changing lesions are noted between office visits    Return for FBSE with Erminio.  LILLETTE Rollene Gobble, RN, am acting as scribe for RUFUS CHRISTELLA HOLY, MD .   Documentation: I have reviewed the above documentation for accuracy and completeness, and I agree with the above.  RUFUS CHRISTELLA HOLY, MD

## 2023-11-04 NOTE — Patient Instructions (Signed)

## 2023-11-06 ENCOUNTER — Encounter: Payer: Self-pay | Admitting: Physician Assistant

## 2023-11-18 NOTE — Progress Notes (Signed)
   11/18/2023  Patient ID: Phillip [name], male   DOB: 10-15-1950, 73 y.o.   MRN: [id number]  Pharmacy Quality Measure Review  This patient is appearing on a report for being at risk of failing the Glycemic Status Assessment in Diabetes measure this calendar year.   A1c drawn and at goal 10/23/2023. Added note to appt note for upcoming pcp appt to add uACR.   Lab Results  Component Value Date   HGBA1C 6.1 (H) 10/23/2023   HGBA1C 10.1 (H) 06/20/2023   HGBA1C 8.9 (H) 03/21/2023   Future Appointments  Date Time Provider Department Center  12/09/2023  8:30 AM Knute Thersia Bitters, FNP [facility] [id number]  12/11/2023  8:45 AM Orman Erminio POUR, PA-C [facility] None  01/20/2024 10:30 AM Vannie Reche RAMAN, NP [facility] [id number]  04/01/2024  8:15 AM [place], Erminio POUR, PA-C [facility] None    Lang Sieve, PharmD, BCGP Clinical Pharmacist  [phone]

## 2023-12-09 ENCOUNTER — Encounter (HOSPITAL_BASED_OUTPATIENT_CLINIC_OR_DEPARTMENT_OTHER): Payer: Self-pay | Admitting: Family Medicine

## 2023-12-09 ENCOUNTER — Ambulatory Visit (INDEPENDENT_AMBULATORY_CARE_PROVIDER_SITE_OTHER): Admitting: Family Medicine

## 2023-12-09 VITALS — BP 138/82 | HR 65 | Ht 67.0 in | Wt 199.0 lb

## 2023-12-09 DIAGNOSIS — E1165 Type 2 diabetes mellitus with hyperglycemia: Secondary | ICD-10-CM | POA: Diagnosis not present

## 2023-12-09 DIAGNOSIS — E7800 Pure hypercholesterolemia, unspecified: Secondary | ICD-10-CM

## 2023-12-09 DIAGNOSIS — Z0000 Encounter for general adult medical examination without abnormal findings: Secondary | ICD-10-CM | POA: Diagnosis not present

## 2023-12-09 DIAGNOSIS — I10 Essential (primary) hypertension: Secondary | ICD-10-CM | POA: Diagnosis not present

## 2023-12-09 NOTE — Patient Instructions (Signed)
 Obtain 2nd Shingrix Vaccine    Things to do to keep yourself healthy: - Exercise at least 30-45 minutes a day, 3-4 days a week.  - Eat a low-fat diet with lots of fruits and vegetables, up to 7-9 servings per day.  - Seatbelts can save your life. Wear them always.  - Smoke detectors on every level of your home, check batteries every year.  - Eye Doctor - have an eye exam every 1-2 years  - Safe sex - if you may be exposed to STDs, use a condom.  - No smoking, vaping, or use of any tobacco products.  - Alcohol -  If you drink, do it moderately, less than 2 drinks per day.  - No illegal drug use.  - Depression is common in our stressful world.If you're feeling down or losing interest in things you normally enjoy, please come in for a visit.  - Violence - If anyone is threatening or hurting you, please call immediately.

## 2023-12-09 NOTE — Progress Notes (Addendum)
 Subjective:   Phillip [name] 1950-08-23 12/09/2023  CC: Chief Complaint  Patient presents with   Annual Exam    Pt is here today for his physical.    HPI: Phillip [name] is a 73 y.o. male who presents for a routine health maintenance exam.  Labs collected at time of visit.   DIABETES MELLITUS: Phillip [name] presents for the medical management of diabetes.  Current diabetes medication regimen: Jardiance  10mg  Patient is  adhering to a diabetic diet.  Patient is  exercising regularly.  Patient is not checking BS regularly.  Patient is  checking their feet regularly.  Denies polydipsia, polyphagia, polyuria, open wounds or ulcers on feet.   See CGM Freestyle Libre 14 day range report scanned into chart.  Average Glucose: 125 Target Range 98% High : 2%  Lab Results  Component Value Date   HGBA1C 6.1 (H) 10/23/2023    Foot Exam: 03/21/2023 Lab Results  Component Value Date   MICROALBUR 135.2 10/25/2022    Wt Readings from Last 3 Encounters:  12/09/23 199 lb (90.3 kg)  08/28/23 200 lb 9.6 oz (91 kg)  07/23/23 206 lb (93.4 kg)     HEALTH SCREENINGS: - Vision Screening: UTD - Dental Visits: up to date - Testicular Exam: Not applicable - STD Screening: Declined - PSA (50+): Up to date   Lab Results  Component Value Date   PSA1 1.6 06/20/2023     - Colonoscopy (45+): Up to date  Discussed with patient purpose of the colonoscopy is to detect colon cancer at curable precancerous or early stages  - AAA Screening: Up to date  Men age 83-75 who have ever smoked - Lung Cancer screening with low-dose CT: Not applicable-  Adults age 60-80 who are current cigarette smokers or quit within the last 15 years. Must have 20 pack year history.   Depression and Anxiety Screen done today and results listed below:     12/09/2023    8:19 AM 06/20/2023    8:45 AM 03/21/2023    9:39 AM 11/02/2019    1:21 PM 08/03/2019    9:43 AM  Depression screen PHQ 2/9  Decreased  Interest 0 0 0 0 0  Down, Depressed, Hopeless 0 0 0 0 0  PHQ - 2 Score 0 0 0 0 0  Altered sleeping 0 0 0  2  Tired, decreased energy 0 0 0  0  Change in appetite 0 0 0  0  Feeling bad or failure about yourself  0 0 0  0  Trouble concentrating 0 0 0  0  Moving slowly or fidgety/restless 0 0 0  0  Suicidal thoughts 0 0 0  0  PHQ-9 Score 0 0  0   2   Difficult doing work/chores Not difficult at all Not difficult at all Not difficult at all  Not difficult at all     Data saved with a previous flowsheet row definition      12/09/2023    8:20 AM 06/20/2023    8:45 AM 03/21/2023    9:39 AM  GAD 7 : Generalized Anxiety Score  Nervous, Anxious, on Edge 0 0 0  Control/stop worrying 0 0 0  Worry too much - different things 0 0 0  Trouble relaxing 0 0 0  Restless 0 0 0  Easily annoyed or irritable 0 0 0  Afraid - awful might happen 0 0 0  Total GAD 7 Score 0 0 0  Anxiety Difficulty Not difficult  at all Not difficult at all Not difficult at all    IMMUNIZATIONS:  - Tdap: Tetanus vaccination status reviewed: last tetanus booster within 10 years. - Influenza: Up to date - Pneumovax: UTD - Prevnar: Up to date - Shingrix vaccine (50+): Will obtain 2nd Shingrix    Past medical history, surgical history, medications, allergies, family history and social history reviewed with patient today and changes made to appropriate areas of the chart.   Past Medical History:  Diagnosis Date   Aortic stenosis    mild to moderate AS 05/2019 echo    CAD in native artery 06/09/2019   Cataract    Diabetes mellitus type 2 in obese 06/09/2019   Elevated hemoglobin A1c    Essential hypertension 06/09/2019   Heart murmur    History of colonoscopy 01/08/2009   History of high cholesterol    Melanoma (HCC)    back   Melanoma (HCC) 09/04/2023   Right upper arm anterior - tx Mohs Dr. Corey 10/08/2023   Pure hypercholesterolemia 06/09/2019    Past Surgical History:  Procedure Laterality Date   CARDIAC  SURGERY  06/30/2019   CATARACT EXTRACTION W/PHACO Left 09/01/2019   Procedure: CATARACT EXTRACTION PHACO AND INTRAOCULAR LENS PLACEMENT (IOC) LEFT 6.30  00:36.6;  Surgeon: Jaye Fallow, MD;  Location: [facility];  Service: Ophthalmology;  Laterality: Left;   CATARACT EXTRACTION W/PHACO Right 09/29/2019   Procedure: CATARACT EXTRACTION PHACO AND INTRAOCULAR LENS PLACEMENT (IOC) RIGHT DIABETIC 5.06 00:35.6;  Surgeon: Jaye Fallow, MD;  Location: [facility];  Service: Ophthalmology;  Laterality: Right;  Diabetic - oral meds   COLONOSCOPY  2003   COLONOSCOPY  01/08/2001   CORONARY ARTERY BYPASS GRAFT N/A 06/30/2019   Procedure: CORONARY ARTERY BYPASS GRAFTING (CABG) x 4, LIMA TO DIAGONAL 1, SVG TO OML, SVG TO RAMUS, SVG TO PDA;  Surgeon: Fleeta Ochoa, Maude, MD;  Location: [facility] OR;  Service: Open Heart Surgery;  Laterality: N/A;   ENDOVEIN HARVEST OF GREATER SAPHENOUS VEIN Bilateral 06/30/2019   Procedure: ENDOVEIN HARVEST OF GREATER SAPHENOUS VEIN;  Surgeon: Fleeta Ochoa Maude, MD;  Location: [facility] OR;  Service: Open Heart Surgery;  Laterality: Bilateral;   EYE SURGERY     MELANOMA EXCISION  01/09/2004   back   TEE WITHOUT CARDIOVERSION N/A 06/30/2019   Procedure: TRANSESOPHAGEAL ECHOCARDIOGRAM (TEE);  Surgeon: Fleeta Ochoa, Maude, MD;  Location: [facility] OR;  Service: Open Heart Surgery;  Laterality: N/A;   THYROGLOSSAL DUCT CYST  01/09/1999    Current Outpatient Medications on File Prior to Visit  Medication Sig   aspirin  EC 81 MG tablet Take 81 mg by mouth daily. Swallow whole.   carvedilol  (COREG ) 6.25 MG tablet Take 1 tablet (6.25 mg total) by mouth 2 (two) times daily with a meal.   empagliflozin  (JARDIANCE ) 10 MG TABS tablet Take 1 tablet (10 mg total) by mouth daily.   Multiple Vitamins-Minerals (CENTRUM SILVER PO) Take by mouth daily.   rosuvastatin  (CRESTOR ) 40 MG tablet Take 1 tablet (40 mg total) by mouth at bedtime.   No current facility-administered medications on file  prior to visit.    No Known Allergies   Social History   Socioeconomic History   Marital status: Married    Spouse name: Not on file   Number of children: Not on file   Years of education: Not on file   Highest education level: Master's degree (e.g., MA, MS, MEng, MEd, MSW, MBA)  Occupational History   Not on file  Tobacco Use  Smoking status: Former    Current packs/day: 0.00    Average packs/day: 2.0 packs/day for 15.0 years (30.0 ttl pk-yrs)    Types: Cigarettes, Cigars    Start date: 37    Quit date: 1979    Years since quitting: 46.9   Smokeless tobacco: Never  Vaping Use   Vaping status: Never Used  Substance and Sexual Activity   Alcohol use: Yes    Alcohol/week: 10.0 standard drinks of alcohol    Types: 4 Glasses of wine, 3 Cans of beer, 3 Shots of liquor per week    Comment: 2 glasses daily   Drug use: Never   Sexual activity: Yes    Birth control/protection: None  Other Topics Concern   Not on file  Social History Narrative   Tobacco use, amount per day now:   Past tobacco use, amount per day: Quit in 1979   How many years did you use tobacco: 10   Alcohol use (drinks per week): 2 glasses of red wine.   Diet:   Do you drink/eat things with caffeine: 1/2 cup coffee per day.   Marital status: Widowed                                 What year were you married?   Do you live in a house, apartment, assisted living, condo, trailer, etc.? Townhouse   Is it one or more stories? One   How many persons live in your home? One   Do you have pets in your home?( please list) No   Highest Level of education completed? Masters Degree.   Current or past profession: Sales   Do you exercise? Yes                                Type and how often? Walk Daily, Tennis.   Do you have a living will? Yes   Do you have a DNR form?  Yes                                 If not, do you want to discuss one?   Do you have signed POA/HPOA forms? Yes                       If so,  please bring to you appointment       Do you have difficulty bathing or dressing yourself? No   Do you have difficulty preparing food or eating? No   Do you have difficulty managing your medications? No   Do you have difficulty managing your finances? No   Do you have difficulty affording your medications? No   Social Drivers of Corporate Investment Banker Strain: Low Risk  (12/05/2023)   Overall Financial Resource Strain (CARDIA)    Difficulty of Paying Living Expenses: Not hard at all  Food Insecurity: No Food Insecurity (12/05/2023)   Hunger Vital Sign    Worried About Running Out of Food in the Last Year: Never true    Ran Out of Food in the Last Year: Never true  Transportation Needs: No Transportation Needs (12/05/2023)   PRAPARE - Administrator, Civil Service (Medical): No    Lack of Transportation (Non-Medical): No  Physical Activity: Sufficiently Active (  12/05/2023)   Exercise Vital Sign    Days of Exercise per Week: 3 days    Minutes of Exercise per Session: 60 min  Stress: No Stress Concern Present (12/05/2023)   [employer] of Occupational Health - Occupational Stress Questionnaire    Feeling of Stress: Not at all  Social Connections: Socially Integrated (12/05/2023)   Social Connection and Isolation Panel    Frequency of Communication with Friends and Family: More than three times a week    Frequency of Social Gatherings with Friends and Family: More than three times a week    Attends Religious Services: More than 4 times per year    Active Member of [employer] or Organizations: Yes    Attends Engineer, Structural: More than 4 times per year    Marital Status: Married  Catering Manager Violence: Not At Risk (03/21/2023)   Humiliation, Afraid, Rape, and Kick questionnaire    Fear of Current or Ex-Partner: No    Emotionally Abused: No    Physically Abused: No    Sexually Abused: No   Social History   Tobacco Use  Smoking Status Former    Current packs/day: 0.00   Average packs/day: 2.0 packs/day for 15.0 years (30.0 ttl pk-yrs)   Types: Cigarettes, Cigars   Start date: 1964   Quit date: 1979   Years since quitting: 46.9  Smokeless Tobacco Never   Social History   Substance and Sexual Activity  Alcohol Use Yes   Alcohol/week: 10.0 standard drinks of alcohol   Types: 4 Glasses of wine, 3 Cans of beer, 3 Shots of liquor per week   Comment: 2 glasses daily     Family History  Problem Relation Age of Onset   CAD Father    Valvular heart disease Father    Colon cancer Father    Cancer Father    Heart disease Maternal Uncle    Heart disease Maternal Grandfather    Kidney failure Mother    Diabetes Mother    Heart disease Mother      ROS: Denies fever, fatigue, unexplained weight loss/gain, CP, SHOB, and palpatitations. Denies neurological deficits, gastrointestinal and/or genitourinary complaints, and skin changes.   Objective:   Today's Vitals   12/09/23 0816 12/09/23 0845  BP: (!) 165/94 138/82  Pulse: 65   SpO2: 97%   Weight: 199 lb (90.3 kg)   Height: 5' 7 (1.702 m)     GENERAL APPEARANCE: Well-appearing, in NAD. Well nourished.  SKIN: Pink, warm and dry. Turgor normal. No rash, lesion, ulceration, or ecchymoses. Hair evenly distributed.  HEENT: HEAD: Normocephalic.  EYES: PERRLA. EOMI. Lids intact w/o defect. Sclera white, Conjunctiva pink w/o exudate.  EARS: External ear w/o redness, swelling, masses or lesions. EAC clear. TM's intact, translucent w/o bulging, appropriate landmarks visualized. Appropriate acuity to conversational tones.  NOSE: Septum midline w/o deformity. Nares patent, mucosa pink and non-inflamed w/o drainage. No sinus tenderness.  THROAT: Uvula midline. Oropharynx clear. Tonsils non-inflamed w/o exudate. Oral mucosa pink and moist.  NECK: Supple, Trachea midline. Full ROM w/o pain or tenderness. No lymphadenopathy. Thyroid non-tender w/o enlargement or palpable masses.   RESPIRATORY: Chest wall symmetrical w/o masses. Respirations even and non-labored. Breath sounds clear to auscultation bilaterally. No wheezes, rales, rhonchi, or crackles. CARDIAC: S1, S2 present, regular rate and rhythm. No gallops, murmurs, rubs, or clicks. PMI w/o lifts, heaves, or thrills. No carotid bruits. Capillary refill <2 seconds. Peripheral pulses 2+ bilaterally. GI: Abdomen soft w/o distention. Normoactive bowel sounds.  No palpable masses or tenderness. No guarding or rebound tenderness. Liver and spleen w/o tenderness or enlargement. No CVA tenderness.  GU: Pt deferred exam. MSK: Muscle tone and strength appropriate for age, w/o atrophy or abnormal movement. EXTREMITIES: Active ROM intact, w/o tenderness, crepitus, or contracture. No obvious joint deformities or effusions. No clubbing, edema, or cyanosis.  NEUROLOGIC: CN's II-XII intact. Motor strength symmetrical with no obvious weakness. No sensory deficits. DTR 2+ symmetric bilaterally. Steady, even gait.  PSYCH/MENTAL STATUS: Alert, oriented x 3. Cooperative, appropriate mood and affect.     Assessment & Plan:  1. Annual physical exam (Primary) Discussed preventative screenings, vaccines, and healthy lifestyle with patient. Patient iwll obtain shingrix 2nd vaccine when able. Fasting labs completed.  - CBC with Differential/Platelet - TSH  2. Pure hypercholesterolemia Will obtain lipid panel with fasting lipids today.  - Lipid panel  3. Type 2 diabetes mellitus with hyperglycemia, without long-term current use of insulin  (HCC) Controlled. Will obtain urine albumin  and CMP with labs today. Doing well with diet and exercise. CGM interpretation provided with report and reviewed with patient.  - Comprehensive metabolic panel with GFR - Microalbumin / creatinine urine ratio  4. Essential hypertension Stable. Will continue management by cardiology.    Orders Placed This Encounter  Procedures   CBC with  Differential/Platelet   Comprehensive metabolic panel with GFR   Lipid panel   TSH   Microalbumin / creatinine urine ratio    PATIENT COUNSELING: - Encouraged to adjust caloric intake to maintain or achieve ideal body weight, to reduce intake of dietary saturated fat and total fat, to limit sodium intake by avoiding high sodium foods and not adding table salt, and to maintain adequate dietary potassium and calcium  preferably from fresh fruits, vegetables, and low-fat dairy products.   - Advised to avoid cigarette smoking. - Discussed with the patient that most people either abstain from alcohol or drink within safe limits (<=14/week and <=4 drinks/occasion for males, <=7/weeks and <= 3 drinks/occasion for females) and that the risk for alcohol disorders and other health effects rises proportionally with the number of drinks per week and how often a drinker exceeds daily limits. - Discussed cessation/primary prevention of drug use and availability of treatment for abuse.   - Stressed the importance of regular exercise - Injury prevention: Discussed safety belts, safety helmets, smoke detector, smoking near bedding or upholstery.  - Dental health: Discussed importance of regular tooth brushing, flossing, and dental visits.  - Sexuality: Discussed sexually transmitted diseases, partner selection, use of condoms, avoidance of unintended pregnancy  and contraceptive alternatives.   NEXT PREVENTATIVE PHYSICAL DUE IN 1 YEAR.  Return in about 6 months (around 06/08/2024) for DIABETES CHECK UP.  Patient to reach out to office if new, worrisome, or unresolved symptoms arise or if no improvement in patient's condition. Patient verbalized understanding and is agreeable to treatment plan. All questions answered to patient's satisfaction.    Thersia Schuyler Stark, [place]

## 2023-12-10 LAB — COMPREHENSIVE METABOLIC PANEL WITH GFR
ALT: 49 IU/L — ABNORMAL HIGH (ref 0–44)
AST: 48 IU/L — ABNORMAL HIGH (ref 0–40)
Albumin: 4.9 g/dL — ABNORMAL HIGH (ref 3.8–4.8)
Alkaline Phosphatase: 67 IU/L (ref 47–123)
BUN/Creatinine Ratio: 23 (ref 10–24)
BUN: 21 mg/dL (ref 8–27)
Bilirubin Total: 0.8 mg/dL (ref 0.0–1.2)
CO2: 23 mmol/L (ref 20–29)
Calcium: 10.6 mg/dL — ABNORMAL HIGH (ref 8.6–10.2)
Chloride: 102 mmol/L (ref 96–106)
Creatinine, Ser: 0.9 mg/dL (ref 0.76–1.27)
Globulin, Total: 2.3 g/dL (ref 1.5–4.5)
Glucose: 137 mg/dL — ABNORMAL HIGH (ref 70–99)
Potassium: 5.4 mmol/L — ABNORMAL HIGH (ref 3.5–5.2)
Sodium: 141 mmol/L (ref 134–144)
Total Protein: 7.2 g/dL (ref 6.0–8.5)
eGFR: 91 mL/min/1.73 (ref >59)

## 2023-12-10 LAB — LIPID PANEL
Chol/HDL Ratio: 2.1 ratio (ref 0.0–5.0)
Cholesterol, Total: 140 mg/dL (ref 100–199)
HDL: 66 mg/dL (ref >39)
LDL Chol Calc (NIH): 60 mg/dL (ref 0–99)
Triglycerides: 68 mg/dL (ref 0–149)
VLDL Cholesterol Cal: 14 mg/dL (ref 5–40)

## 2023-12-10 LAB — CBC WITH DIFFERENTIAL/PLATELET
Basophils Absolute: 0.1 x10E3/uL (ref 0.0–0.2)
Basos: 1 %
EOS (ABSOLUTE): 0.1 x10E3/uL (ref 0.0–0.4)
Eos: 2 %
Hematocrit: 52.5 % — ABNORMAL HIGH (ref 37.5–51.0)
Hemoglobin: 16.7 g/dL (ref 13.0–17.7)
Immature Grans (Abs): 0 x10E3/uL (ref 0.0–0.1)
Immature Granulocytes: 0 %
Lymphocytes Absolute: 1.2 x10E3/uL (ref 0.7–3.1)
Lymphs: 17 %
MCH: 30.7 pg (ref 26.6–33.0)
MCHC: 31.8 g/dL (ref 31.5–35.7)
MCV: 97 fL (ref 79–97)
Monocytes Absolute: 0.6 x10E3/uL (ref 0.1–0.9)
Monocytes: 9 %
Neutrophils Absolute: 5 x10E3/uL (ref 1.4–7.0)
Neutrophils: 71 %
Platelets: 162 x10E3/uL (ref 150–450)
RBC: 5.44 x10E6/uL (ref 4.14–5.80)
RDW: 13.1 % (ref 11.6–15.4)
WBC: 7 x10E3/uL (ref 3.4–10.8)

## 2023-12-10 LAB — MICROALBUMIN / CREATININE URINE RATIO
Creatinine, Urine: 54.5 mg/dL
Microalb/Creat Ratio: 369 mg/g{creat} — ABNORMAL HIGH (ref 0–29)
Microalbumin, Urine: 201.3 ug/mL

## 2023-12-10 LAB — TSH: TSH: 1.2 u[IU]/mL (ref 0.450–4.500)

## 2023-12-11 ENCOUNTER — Encounter: Payer: Self-pay | Admitting: Physician Assistant

## 2023-12-11 ENCOUNTER — Ambulatory Visit: Admitting: Physician Assistant

## 2023-12-11 VITALS — BP 159/78

## 2023-12-11 DIAGNOSIS — L409 Psoriasis, unspecified: Secondary | ICD-10-CM

## 2023-12-11 DIAGNOSIS — W908XXA Exposure to other nonionizing radiation, initial encounter: Secondary | ICD-10-CM | POA: Diagnosis not present

## 2023-12-11 DIAGNOSIS — D1801 Hemangioma of skin and subcutaneous tissue: Secondary | ICD-10-CM | POA: Diagnosis not present

## 2023-12-11 DIAGNOSIS — L570 Actinic keratosis: Secondary | ICD-10-CM | POA: Diagnosis not present

## 2023-12-11 DIAGNOSIS — Z85820 Personal history of malignant melanoma of skin: Secondary | ICD-10-CM

## 2023-12-11 DIAGNOSIS — L814 Other melanin hyperpigmentation: Secondary | ICD-10-CM | POA: Diagnosis not present

## 2023-12-11 DIAGNOSIS — L821 Other seborrheic keratosis: Secondary | ICD-10-CM | POA: Diagnosis not present

## 2023-12-11 DIAGNOSIS — D229 Melanocytic nevi, unspecified: Secondary | ICD-10-CM

## 2023-12-11 DIAGNOSIS — Z1283 Encounter for screening for malignant neoplasm of skin: Secondary | ICD-10-CM | POA: Diagnosis not present

## 2023-12-11 DIAGNOSIS — L578 Other skin changes due to chronic exposure to nonionizing radiation: Secondary | ICD-10-CM | POA: Diagnosis not present

## 2023-12-11 NOTE — Patient Instructions (Addendum)

## 2023-12-11 NOTE — Progress Notes (Signed)
 Follow-Up Visit   Subjective  Phillip [name] is a 73 y.o. male ESTABLISHED PATIENT who presents for the following: Skin Cancer Screening and Full Body Skin Exam - History of Melanoma of back 01/09/2004 and right upper arm 10/08/2023.  He also has psoriasis of his hands and ears. He is not treating it currently and usually flares in November when the weather changes. He is using Aveeno lotion and it is the best it has been in years right now.  The patient presents for Total-Body Skin Exam (TBSE) for skin cancer screening and mole check. The patient has spots, moles and lesions to be evaluated, some may be new or changing and the patient may have concern these could be cancer.    The following portions of the chart were reviewed this encounter and updated as appropriate: medications, allergies, medical history  Review of Systems:  No other skin or systemic complaints except as noted in HPI or Assessment and Plan.  Objective  Well appearing patient in no apparent distress; mood and affect are within normal limits.  A full examination was performed including scalp, head, eyes, ears, nose, lips, neck, chest, axillae, abdomen, back, buttocks, bilateral upper extremities, bilateral lower extremities, hands, feet, fingers, toes, fingernails, and toenails. All findings within normal limits unless otherwise noted below.   NO CERVICAL, AXILLARY OR INGUINAL LYMPHADENOPATHY.   Relevant physical exam findings are noted in the Assessment and Plan.  Scalp (20) Erythematous thin papules/macules with gritty scale.   Assessment & Plan   SKIN CANCER SCREENING PERFORMED TODAY.  ACTINIC DAMAGE - Chronic condition, secondary to cumulative UV/sun exposure - diffuse scaly erythematous macules with underlying dyspigmentation - Recommend daily broad spectrum sunscreen SPF 30+ to sun-exposed areas, reapply every 2 hours as needed.  - Staying in the shade or wearing long sleeves, sun glasses (UVA+UVB  protection) and wide brim hats (4-inch brim around the entire circumference of the hat) are also recommended for sun protection.  - Call for new or changing lesions.  LENTIGINES, SEBORRHEIC KERATOSES, HEMANGIOMAS - Benign normal skin lesions - Benign-appearing - Call for any changes  MELANOCYTIC NEVI - Tan-brown and/or pink-flesh-colored symmetric macules and papules - Benign appearing on exam today - Observation - Call clinic for new or changing moles - Recommend daily use of broad spectrum spf 30+ sunscreen to sun-exposed areas.   HISTORY OF MELANOMA OF RIGHT UPPER ARM (10/08/2023), BACK (01/09/2004) - No evidence of recurrence today - No lymphadenopathy - Recommend regular full body skin exams - Recommend daily broad spectrum sunscreen SPF 30+ to sun-exposed areas, reapply every 2 hours as needed.  - Call if any new or changing lesions are noted between office visits  PSORIASIS Exam: Scaly pink plaques  Psoriasis is a chronic non-curable, but treatable genetic/hereditary disease that may have other systemic features affecting other organ systems such as joints (Psoriatic Arthritis). It is associated with an increased risk of inflammatory bowel disease, heart disease, non-alcoholic fatty liver disease, and depression.  Treatments include light and laser treatments; topical medications; and systemic medications including oral and injectables.  Treatment Plan: Continue Aveeno lotion daily.      AK (ACTINIC KERATOSIS) (20) Scalp (20) Destruction of lesion - Scalp (20) Complexity: simple   Destruction method: cryotherapy   Informed consent: discussed and consent obtained   Timeout:  patient name, date of birth, surgical site, and procedure verified Lesion destroyed using liquid nitrogen: Yes   Region frozen until ice ball extended beyond lesion: Yes   Outcome: patient  tolerated procedure well with no complications   Post-procedure details: wound care instructions given     HISTORY OF MELANOMA   CHERRY ANGIOMA   ACTINIC SKIN DAMAGE   LENTIGINES   MULTIPLE BENIGN NEVI   SEBORRHEIC KERATOSIS   SCREENING EXAM FOR SKIN CANCER   PSORIASIS     Return in about 3 months (around 03/10/2024) for TBSE History of Melanoma.  I, Roseline Hutchinson, CMA, am acting as scribe for Tilmon Wisehart K, PA-C .   Documentation: I have reviewed the above documentation for accuracy and completeness, and I agree with the above.  Marcoantonio Legault K, PA-C

## 2023-12-12 ENCOUNTER — Ambulatory Visit (HOSPITAL_BASED_OUTPATIENT_CLINIC_OR_DEPARTMENT_OTHER): Payer: Self-pay | Admitting: Family Medicine

## 2023-12-12 DIAGNOSIS — R809 Proteinuria, unspecified: Secondary | ICD-10-CM

## 2023-12-12 DIAGNOSIS — E1129 Type 2 diabetes mellitus with other diabetic kidney complication: Secondary | ICD-10-CM

## 2023-12-12 NOTE — Progress Notes (Signed)
 Hi Phillip [name], Your potassium, calcium  and liver enzymes were slightly elevated.  Your hematocrit was also slightly elevated and these may indicate some slight dehydration going on at the time of your lab collection.  Your urine microalbumin has improved significantly in the past year from 1001 down to 369.  However, this is still severely increased for passing protein in your urine at this time I would recommend an evaluation by nephrology.  Increasing your Jardiance  may help with renal function and if you would like to try this with a recheck of urine albumin  in 3 months that would be appropriate as well. Please let me know. I would like to recheck your BMP in 2 weeks as well.   Your cholesterol is well-controlled currently and your thyroid function was normal.

## 2023-12-25 ENCOUNTER — Encounter (HOSPITAL_BASED_OUTPATIENT_CLINIC_OR_DEPARTMENT_OTHER): Admitting: Family Medicine

## 2023-12-25 ENCOUNTER — Encounter (HOSPITAL_BASED_OUTPATIENT_CLINIC_OR_DEPARTMENT_OTHER): Payer: Self-pay | Admitting: *Deleted

## 2023-12-26 ENCOUNTER — Encounter (HOSPITAL_BASED_OUTPATIENT_CLINIC_OR_DEPARTMENT_OTHER): Admitting: Family Medicine

## 2024-01-20 ENCOUNTER — Encounter (HOSPITAL_BASED_OUTPATIENT_CLINIC_OR_DEPARTMENT_OTHER): Payer: Self-pay | Admitting: Family

## 2024-01-20 ENCOUNTER — Ambulatory Visit (HOSPITAL_BASED_OUTPATIENT_CLINIC_OR_DEPARTMENT_OTHER): Admitting: Family

## 2024-01-20 ENCOUNTER — Encounter (HOSPITAL_BASED_OUTPATIENT_CLINIC_OR_DEPARTMENT_OTHER): Payer: Self-pay

## 2024-01-20 VITALS — BP 122/72 | HR 77 | Ht 66.0 in | Wt 199.0 lb

## 2024-01-20 DIAGNOSIS — I25118 Atherosclerotic heart disease of native coronary artery with other forms of angina pectoris: Secondary | ICD-10-CM | POA: Diagnosis not present

## 2024-01-20 DIAGNOSIS — I77810 Thoracic aortic ectasia: Secondary | ICD-10-CM | POA: Diagnosis not present

## 2024-01-20 DIAGNOSIS — E785 Hyperlipidemia, unspecified: Secondary | ICD-10-CM | POA: Diagnosis not present

## 2024-01-20 DIAGNOSIS — I350 Nonrheumatic aortic (valve) stenosis: Secondary | ICD-10-CM

## 2024-01-20 NOTE — Progress Notes (Signed)
 " Cardiology Office Note:  .   Date:  01/20/2024  ID:  Phillip [name], DOB 07-11-1950, MRN [id number] PCP: Knute Thersia Bitters, FNP  [facility] Providers Cardiologist:  Annabella Scarce, MD    History of Present Illness: Phillip   Toben [name] is a 74 y.o. male with hx of CAD s/p CABG (LIMA-LAD, SVG-OM, RI, PDA), moderate aortic stenosis, HTN, HLD, mild ascending aortic aneurysm, DM2.  Initial evaluation 04/2019 for second opinion of coronary artery disease. Abnormal stress echo at [facility] 05/25/19. Workup at [facility] with LHC revealed 3 vessel obstructive CAD. Echo with LVEF 50%, mild global hypkinesis, normal RV function, mild to moderate AS. He was referred to Dr. Obadiah an underwent CABG 06/2019. Repeat echo 09/2020 LVEF 55%, gr2DD, moderate AS with mean gradient .   Seen 08/20/2022 with BP elevated clinic but overall mildly elevated in the 130s at home.  He was to make lifestyle changes. Updated echo 09/14/2022 low normal LVEF 50 to 50%, distal septal hypokinesis, RV normal, moderate aortic stenosis with mean gradient 25.4 mmHg and mild dilation of ascending aorta 39 mm.  At visit 01/08/23 and 07/23/23 BP was well controlled by home readings and current medications continued.   Via MyChart messages 07/2023 after further lifestyle changes his blood pressure was routinely less than 110 and he was recommended to reduce the carvedilol  in half.  Presents today for follow up independently. Congratulated on continued weight loss with lifestyle changes. BP at home most often 130. No recurrent hypotension, lightheadedness after reducing carvedilol  dose. Reports no shortness of breath nor dyspnea on exertion. Reports no chest pain, pressure, or tightness. No edema, orthopnea, PND. Reports no palpitations.  Motivated to increase his exercise - not playing tennis as often recently. 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  ROS: Please see the history of present  illness.    All other systems reviewed and are negative.   Studies Reviewed: Phillip        Please see the history of present illness.    All other systems reviewed and are negative.    Risk Assessment/Calculations:             Physical Exam:   VS:  BP 122/72   Pulse 77   Ht 5' 6 (1.676 m)   Wt 199 lb (90.3 kg)   SpO2 97%   BMI 32.12 kg/m    Wt Readings from Last 3 Encounters:  01/20/24 199 lb (90.3 kg)  12/09/23 199 lb (90.3 kg)  08/28/23 200 lb 9.6 oz (91 kg)     Vitals:   01/20/24 1050 01/20/24 1106  BP: (!) 140/84 122/72  Pulse: 77   Height: 5' 6 (1.676 m)   Weight: 199 lb (90.3 kg)   SpO2: 97%   BMI (Calculated): 32.13     GEN: Well nourished, well developed in no acute distress NECK: No JVD; No carotid bruits CARDIAC: RRR, gr 2/6 murmur, no rubs, gallops RESPIRATORY:  Clear to auscultation without rales, wheezing or rhonchi  ABDOMEN: Soft, non-tender, non-distended EXTREMITIES:  No edema; No deformity   ASSESSMENT AND PLAN: .    CAD - Stable with no anginal symptoms. No indication for ischemic evaluation.  GDMT aspirin  81mg  daily, Coreg  6.25mg  BID, Crestor  40mg  daily. Recommend aiming for 150 minutes of moderate intensity activity per week and following a heart healthy diet.    Moderate AS / Ascending aortic dilation  -Echo 09/2023 normal LVEF, moderate LVH, moderate AS,  mild dilation ascendign aorta 41mm. Repeat echo 1 year already ordered.   Whitecoat hypertension superimposed upon HTN - BP reasonably controlled by home readings, controlled in clinic on repeat. Continue Coreg  6.25mg  BID.   HLD -12/09/2023 TC 140, TG 68, HDL 66, LDL 60, AST 48, ALT 49.  Continue Rosuvastatin  40mg  daily.   DM2 - Continue to follow with PCP. Due to elevated micoalb/creatinine ratio was recommended increased Jardiance  with repeat in 3 mos vs referral to nephrology. He wishes to think about this and will contact PCP with his preference on next step.    Dispo: follow up in 12  months  Signed, Reche GORMAN Finder, NP   "

## 2024-01-20 NOTE — Patient Instructions (Addendum)
 Medication Instructions:   Your physician recommends that you continue on your current medications as directed. Please refer to the Current Medication list given to you today.   *If you need a refill on your cardiac medications before your next appointment, please call your pharmacy*  Lab Work:  None ordered.   If you have labs (blood work) drawn today and your tests are completely normal, you will receive your results only by: MyChart Message (if you have MyChart) OR A paper copy in the mail If you have any lab test that is abnormal or we need to change your treatment, we will call you to review the results.  Testing/Procedures:  Your physician has requested that you have an echocardiogram. Echocardiography is a painless test that uses sound waves to create images of your heart. It provides your doctor with information about the size and shape of your heart and how well your hearts chambers and valves are working. This procedure takes approximately one hour. There are no restrictions for this procedure. Please do NOT wear cologne, aftershave, or lotions (deodorant is allowed). Please arrive 15 minutes prior to your appointment time.   Follow-Up: At [facility], you and your health needs are our priority.  As part of our continuing mission to provide you with exceptional heart care, our providers are all part of one team.  This team includes your primary Cardiologist (physician) and Advanced Practice Providers or APPs (Physician Assistants and Nurse Practitioners) who all work together to provide you with the care you need, when you need it.  Your next appointment:   8 month(s)  Provider:   Annabella Scarce, MD, Rosaline Bane, NP, or Reche Finder, NP    We recommend signing up for the patient portal called MyChart.  Sign up information is provided on this After Visit Summary.  MyChart is used to connect with patients for Virtual Visits (Telemedicine).  Patients are  able to view lab/test results, encounter notes, upcoming appointments, etc.  Non-urgent messages can be sent to your provider as well.   To learn more about what you can do with MyChart, go to [email or web address].   Other Instructions  Your physician wants you to follow-up in: 8 months.  You will receive a reminder letter in the mail two months in advance. If you don't receive a letter, please call our office to schedule the follow-up appointment.            Primary care recommended either increasing Jardiance  and repeating your Microalb/Creat Ratio in 3 months or seeing nephrology.

## 2024-01-30 ENCOUNTER — Encounter: Payer: Self-pay | Admitting: Physician Assistant

## 2024-01-31 ENCOUNTER — Other Ambulatory Visit (HOSPITAL_BASED_OUTPATIENT_CLINIC_OR_DEPARTMENT_OTHER)

## 2024-03-31 ENCOUNTER — Ambulatory Visit: Admitting: Physician Assistant

## 2024-04-01 ENCOUNTER — Ambulatory Visit: Admitting: Physician Assistant

## 2024-06-10 ENCOUNTER — Ambulatory Visit (HOSPITAL_BASED_OUTPATIENT_CLINIC_OR_DEPARTMENT_OTHER): Admitting: Family Medicine

## 2024-09-15 ENCOUNTER — Other Ambulatory Visit (HOSPITAL_BASED_OUTPATIENT_CLINIC_OR_DEPARTMENT_OTHER)

## 2028-09-16 ENCOUNTER — Other Ambulatory Visit (HOSPITAL_BASED_OUTPATIENT_CLINIC_OR_DEPARTMENT_OTHER)
